# Patient Record
Sex: Male | Born: 1940
Health system: Southern US, Community
[De-identification: ages and names within clinical notes are randomized; demographics above are authoritative.]

## PROBLEM LIST (undated history)

## (undated) DIAGNOSIS — T8859XA Other complications of anesthesia, initial encounter: Secondary | ICD-10-CM

## (undated) DIAGNOSIS — N400 Enlarged prostate without lower urinary tract symptoms: Secondary | ICD-10-CM

## (undated) DIAGNOSIS — E785 Hyperlipidemia, unspecified: Secondary | ICD-10-CM

## (undated) DIAGNOSIS — M1990 Unspecified osteoarthritis, unspecified site: Secondary | ICD-10-CM

## (undated) DIAGNOSIS — H9319 Tinnitus, unspecified ear: Secondary | ICD-10-CM

## (undated) DIAGNOSIS — R310 Gross hematuria: Secondary | ICD-10-CM

## (undated) DIAGNOSIS — T4145XA Adverse effect of unspecified anesthetic, initial encounter: Secondary | ICD-10-CM

## (undated) DIAGNOSIS — Z973 Presence of spectacles and contact lenses: Secondary | ICD-10-CM

## (undated) DIAGNOSIS — N1330 Unspecified hydronephrosis: Secondary | ICD-10-CM

## (undated) DIAGNOSIS — R972 Elevated prostate specific antigen [PSA]: Secondary | ICD-10-CM

## (undated) DIAGNOSIS — I10 Essential (primary) hypertension: Secondary | ICD-10-CM

## (undated) DIAGNOSIS — K219 Gastro-esophageal reflux disease without esophagitis: Secondary | ICD-10-CM

## (undated) DIAGNOSIS — R112 Nausea with vomiting, unspecified: Secondary | ICD-10-CM

## (undated) DIAGNOSIS — Z9889 Other specified postprocedural states: Secondary | ICD-10-CM

## (undated) HISTORY — PX: OTHER SURGICAL HISTORY: SHX169

## (undated) HISTORY — DX: Essential (primary) hypertension: I10

## (undated) HISTORY — PX: TOTAL HIP ARTHROPLASTY: SHX124

## (undated) HISTORY — DX: Hyperlipidemia, unspecified: E78.5

## (undated) HISTORY — PX: APPENDECTOMY: SHX54

---

## 2003-12-04 ENCOUNTER — Emergency Department (HOSPITAL_COMMUNITY): Admission: EM | Admit: 2003-12-04 | Discharge: 2003-12-04 | Payer: Self-pay | Admitting: *Deleted

## 2005-07-09 ENCOUNTER — Inpatient Hospital Stay (HOSPITAL_COMMUNITY): Admission: RE | Admit: 2005-07-09 | Discharge: 2005-07-12 | Payer: Self-pay | Admitting: Orthopedic Surgery

## 2011-01-22 ENCOUNTER — Ambulatory Visit
Admission: RE | Admit: 2011-01-22 | Discharge: 2011-01-22 | Disposition: A | Discharge status: Home / Self Care | Payer: Self-pay | Source: Ambulatory Visit | Attending: *Deleted | Admitting: *Deleted

## 2011-01-22 ENCOUNTER — Other Ambulatory Visit: Payer: Self-pay | Admitting: *Deleted

## 2011-01-22 DIAGNOSIS — IMO0002 Reserved for concepts with insufficient information to code with codable children: Secondary | ICD-10-CM

## 2011-03-25 HISTORY — PX: CATARACT EXTRACTION W/ INTRAOCULAR LENS IMPLANT: SHX1309

## 2011-04-08 DIAGNOSIS — H40019 Open angle with borderline findings, low risk, unspecified eye: Secondary | ICD-10-CM | POA: Diagnosis not present

## 2011-05-09 DIAGNOSIS — H2510 Age-related nuclear cataract, unspecified eye: Secondary | ICD-10-CM | POA: Diagnosis not present

## 2011-05-29 DIAGNOSIS — H2510 Age-related nuclear cataract, unspecified eye: Secondary | ICD-10-CM | POA: Diagnosis not present

## 2011-06-16 DIAGNOSIS — R809 Proteinuria, unspecified: Secondary | ICD-10-CM | POA: Diagnosis not present

## 2011-06-16 DIAGNOSIS — R82998 Other abnormal findings in urine: Secondary | ICD-10-CM | POA: Diagnosis not present

## 2011-06-16 DIAGNOSIS — Z0000 Encounter for general adult medical examination without abnormal findings: Secondary | ICD-10-CM | POA: Diagnosis not present

## 2011-08-22 DIAGNOSIS — J309 Allergic rhinitis, unspecified: Secondary | ICD-10-CM | POA: Diagnosis not present

## 2011-08-22 DIAGNOSIS — I10 Essential (primary) hypertension: Secondary | ICD-10-CM | POA: Diagnosis not present

## 2011-10-03 DIAGNOSIS — K143 Hypertrophy of tongue papillae: Secondary | ICD-10-CM | POA: Diagnosis not present

## 2011-11-06 DIAGNOSIS — H40019 Open angle with borderline findings, low risk, unspecified eye: Secondary | ICD-10-CM | POA: Diagnosis not present

## 2012-01-19 ENCOUNTER — Encounter: Payer: Self-pay | Admitting: Physician Assistant

## 2012-01-19 ENCOUNTER — Ambulatory Visit (INDEPENDENT_AMBULATORY_CARE_PROVIDER_SITE_OTHER): Payer: Self-pay | Admitting: Physician Assistant

## 2012-01-19 VITALS — BP 131/68 | HR 88 | Temp 98.4°F | Ht 72.5 in | Wt 223.0 lb

## 2012-01-19 DIAGNOSIS — IMO0002 Reserved for concepts with insufficient information to code with codable children: Secondary | ICD-10-CM

## 2012-01-19 DIAGNOSIS — Z87898 Personal history of other specified conditions: Secondary | ICD-10-CM

## 2012-01-19 DIAGNOSIS — Z131 Encounter for screening for diabetes mellitus: Secondary | ICD-10-CM

## 2012-01-19 DIAGNOSIS — E785 Hyperlipidemia, unspecified: Secondary | ICD-10-CM | POA: Diagnosis not present

## 2012-01-19 DIAGNOSIS — I10 Essential (primary) hypertension: Secondary | ICD-10-CM | POA: Diagnosis not present

## 2012-01-19 DIAGNOSIS — Z13220 Encounter for screening for lipoid disorders: Secondary | ICD-10-CM

## 2012-01-19 DIAGNOSIS — M170 Bilateral primary osteoarthritis of knee: Secondary | ICD-10-CM

## 2012-01-19 DIAGNOSIS — M1710 Unilateral primary osteoarthritis, unspecified knee: Secondary | ICD-10-CM | POA: Diagnosis not present

## 2012-01-19 MED ORDER — MELOXICAM 7.5 MG PO TABS: 7.5000 mg | ORAL_TABLET | Freq: Every day | ORAL | Status: DC

## 2012-01-19 NOTE — Patient Instructions (Addendum)
Get labs will call with results.   Start Mobic. Follow up with CPE.

## 2012-01-20 DIAGNOSIS — E785 Hyperlipidemia, unspecified: Secondary | ICD-10-CM | POA: Diagnosis not present

## 2012-01-20 DIAGNOSIS — Z87898 Personal history of other specified conditions: Secondary | ICD-10-CM | POA: Insufficient documentation

## 2012-01-20 DIAGNOSIS — M170 Bilateral primary osteoarthritis of knee: Secondary | ICD-10-CM | POA: Insufficient documentation

## 2012-01-20 LAB — COMPREHENSIVE METABOLIC PANEL
ALT: 17 U/L (ref 0–53)
AST: 15 U/L (ref 0–37)
Albumin: 4.1 g/dL (ref 3.5–5.2)
Alkaline Phosphatase: 55 U/L (ref 39–117)
BUN: 21 mg/dL (ref 6–23)
CO2: 24 mEq/L (ref 19–32)
Calcium: 9.3 mg/dL (ref 8.4–10.5)
Chloride: 108 mEq/L (ref 96–112)
Creat: 1.67 mg/dL — ABNORMAL HIGH (ref 0.50–1.35)
Glucose, Bld: 109 mg/dL — ABNORMAL HIGH (ref 70–99)
Potassium: 4.8 mEq/L (ref 3.5–5.3)
Sodium: 143 mEq/L (ref 135–145)
Total Bilirubin: 0.5 mg/dL (ref 0.3–1.2)
Total Protein: 7.4 g/dL (ref 6.0–8.3)

## 2012-01-20 LAB — LIPID PANEL
Cholesterol: 249 mg/dL — ABNORMAL HIGH (ref 0–200)
HDL: 54 mg/dL (ref >39)
LDL Cholesterol: 178 mg/dL — ABNORMAL HIGH (ref 0–99)
Total CHOL/HDL Ratio: 4.6 Ratio
Triglycerides: 84 mg/dL (ref <150)
VLDL: 17 mg/dL (ref 0–40)

## 2012-01-20 NOTE — Progress Notes (Signed)
Subjective:    Patient ID: Albert Hicks, male    DOB: [birth year], 71 y.o.   MRN: [id number]  HPI Patient presents to the clinic to establish as a new patient. PMH was reviewed and positive for HTN, elevated PSA and hyperlipidemia.   He is currently on Azor for blood pressure and well controlled. He denies any CP, palpitations, SOB. He tries to maintain a healthy balanced diet. He has not had bloodwork in a while.  He does have history of elevated PSA. He has had this for years and had numerous biopies and all were benign. He is currently being followed by Santina Evans at [facility].   Hyperlipidemia is ongoing. Last rx he tried was Lipitor and it caused muscle aches. He has not tried anything since or had blood work.   He does not know when last Tdap was and wants [place] to get records before we vaccinate him for it. He has had colonoscopy in 2009 and was told to follow up in 10 years. He has had a right hip replacement.   Family history reviewed and positive for prostate cancer, diabetes, and high blood pressure. Social hx reviewed. Never smoked.   He does have ongoing bilateral knee pain that is worse on the left knee. He takes ibuprofen but other than that has not tried anything. He has had x-rays done 10 years ago that saw arthritis already setting in. He tries to stay active and he does a good job. He plays golf and stays on the go a lot. He exercises 3 days at least 1 hour a week. He has started to have a lot of problems flexing his knee. Denies any recent trauma.   Review of Systems     Objective:   Physical Exam  Constitutional: He is oriented to person, place, and time. He appears well-developed and well-nourished.  HENT:  Head: Normocephalic and atraumatic.  Cardiovascular: Normal rate, regular rhythm and normal heart sounds.   Pulmonary/Chest: Effort normal and breath sounds normal. He has no wheezes.  Musculoskeletal:       Decreased ROM of both knees. Extension is normal but  flexion is at 95 approx degrees in left knee and 110 approx in right knee. Denies any joint tenderness to palpation bilaterally. Mild crepitus. Pt's gait is normal. He did have a hard time getting up out of chair.   Neurological: He is alert and oriented to person, place, and time.  Skin: Skin is warm and dry.  Psychiatric: He has a normal mood and affect. His behavior is normal.          Assessment & Plan:  HTN- continue with Azor. Reminded patient of low salt diet and regular exercise. Follow up in 6 months.   Hyperlipidemia- Gave lab slip to get fasting labs drawn. Discuss with patient livalo as a option that might have less side effects. He is in agreement to try. Will get baseline labs and then give options for treatment. Reassured patient that if statins were not able to be tolerated then we could try other options. Also encouraged to make good diet decisions and try to stay active. Over 50 encouraged daily ASA.   Elevated PSA- Encouraged patient to continue regular follow ups with urologist.   Osteoarthritis of knees- Will try mobic to use 7.5mg  daily for first 2 weeks then only as needed or on days of a lot of activity. Discussed with patient options of cortisone injections or supartz. He is aware but does not  want to try anything else right now. He declines x-rays. I would like to do x-rays in the near future. Will follow up in next 3 months and see how mobic is working and if he wants to proceed with further treatment.    Discussed with patient need for CPE in near future.

## 2012-01-22 DIAGNOSIS — E785 Hyperlipidemia, unspecified: Secondary | ICD-10-CM | POA: Diagnosis not present

## 2012-01-22 DIAGNOSIS — Z131 Encounter for screening for diabetes mellitus: Secondary | ICD-10-CM | POA: Diagnosis not present

## 2012-01-22 DIAGNOSIS — R7309 Other abnormal glucose: Secondary | ICD-10-CM | POA: Diagnosis not present

## 2012-01-22 DIAGNOSIS — I10 Essential (primary) hypertension: Secondary | ICD-10-CM | POA: Diagnosis not present

## 2012-01-22 NOTE — Addendum Note (Signed)
Addended by: Ellsworth Lennox on: 01/22/2012 09:25 AM   Modules accepted: Orders

## 2012-01-26 DIAGNOSIS — I10 Essential (primary) hypertension: Secondary | ICD-10-CM | POA: Diagnosis not present

## 2012-01-27 LAB — HEMOGLOBIN A1C
Hgb A1c MFr Bld: 5.6 % (ref <5.7)
Mean Plasma Glucose: 114 mg/dL (ref <117)

## 2012-01-27 LAB — CK: Total CK: 89 U/L (ref 7–232)

## 2012-02-07 DIAGNOSIS — E7800 Pure hypercholesterolemia, unspecified: Secondary | ICD-10-CM | POA: Diagnosis not present

## 2012-02-07 DIAGNOSIS — Z96649 Presence of unspecified artificial hip joint: Secondary | ICD-10-CM | POA: Diagnosis not present

## 2012-02-07 DIAGNOSIS — R319 Hematuria, unspecified: Secondary | ICD-10-CM | POA: Diagnosis not present

## 2012-02-07 DIAGNOSIS — N1330 Unspecified hydronephrosis: Secondary | ICD-10-CM | POA: Diagnosis not present

## 2012-02-07 DIAGNOSIS — Z79899 Other long term (current) drug therapy: Secondary | ICD-10-CM | POA: Diagnosis not present

## 2012-02-07 DIAGNOSIS — N400 Enlarged prostate without lower urinary tract symptoms: Secondary | ICD-10-CM | POA: Diagnosis not present

## 2012-02-07 DIAGNOSIS — R7309 Other abnormal glucose: Secondary | ICD-10-CM | POA: Diagnosis not present

## 2012-02-07 DIAGNOSIS — R935 Abnormal findings on diagnostic imaging of other abdominal regions, including retroperitoneum: Secondary | ICD-10-CM | POA: Diagnosis not present

## 2012-02-07 DIAGNOSIS — N289 Disorder of kidney and ureter, unspecified: Secondary | ICD-10-CM | POA: Diagnosis not present

## 2012-02-07 DIAGNOSIS — I10 Essential (primary) hypertension: Secondary | ICD-10-CM | POA: Diagnosis not present

## 2012-02-10 ENCOUNTER — Other Ambulatory Visit: Payer: Self-pay | Admitting: Urology

## 2012-02-10 DIAGNOSIS — N401 Enlarged prostate with lower urinary tract symptoms: Secondary | ICD-10-CM | POA: Diagnosis not present

## 2012-02-10 DIAGNOSIS — N1330 Unspecified hydronephrosis: Secondary | ICD-10-CM | POA: Diagnosis not present

## 2012-02-10 DIAGNOSIS — R972 Elevated prostate specific antigen [PSA]: Secondary | ICD-10-CM | POA: Diagnosis not present

## 2012-02-11 ENCOUNTER — Encounter (HOSPITAL_BASED_OUTPATIENT_CLINIC_OR_DEPARTMENT_OTHER): Payer: Self-pay | Admitting: *Deleted

## 2012-02-11 NOTE — Progress Notes (Signed)
NPO AFTER MN. ARRIVES AT 0830. NEEDS ISTAT AND EKG. WILL TAKE NORVASC AM OF SURG W/ SIP OF WATER.

## 2012-02-12 NOTE — H&P (Signed)
History of Present Illness  Albert Hicks was seen in the [place] ER 4 days ago with a history of sudden onset of gross painless hematuria.  It started Saturday afternoon and cleared up Sunday.  Yesterday he had 2 more episodes of hematuria.  CT scan without contrast showed moderate left hydronephrosis, thickened bladder wall and enlarged prostate.  He has a history of elevated PSA with at least five negative biopsies in the past. He reports that he voids with a good flow.  He denies dysuria, nocturia.   Past Medical History Problems  1. History of  Esophageal Reflux 530.81 2. History of  Gout 274.9 3. History of  Hypercholesterolemia 272.0  Surgical History Problems  1. History of  Knee Surgery Left 2. History of  Total Hip Replacement  Current Meds 1. AmLODIPine Besylate 5 MG Oral Tablet; Therapy: 17Nov2013 to 2. Ciprofloxacin HCl 500 MG Oral Tablet; Therapy: 17Nov2013 to 3. Hydrochlorothiazide 25 MG Oral Tablet; Therapy: 17Nov2013 to 4. Livalo 2 MG Oral Tablet; Therapy: (Recorded:19Nov2013) to  Allergies Medication  1. No Known Drug Allergies  Family History Problems  1. Family history of  Family Health Status Number Of Children 1 son 4 daughters 2. Paternal history of  Prostate Cancer V16.42  Social History Problems  1. Alcohol Use 2 per day 2. Caffeine Use 1 per day 3. Marital History - Single Denied  4. History of  Tobacco Use  Review of Systems Genitourinary, constitutional, skin, eye, otolaryngeal, hematologic/lymphatic, cardiovascular, pulmonary, endocrine, musculoskeletal, gastrointestinal, neurological and psychiatric system(s) were reviewed and pertinent findings if present are noted.  Genitourinary: weak urinary stream, urinary stream starts and stops, hematuria and erectile dysfunction.  Musculoskeletal: joint pain.    Physical Exam Constitutional: Well nourished and well developed . No acute distress.  ENT:. The ears and nose are normal in appearance.    Neck: The appearance of the neck is normal and no neck mass is present.  Pulmonary: No respiratory distress and normal respiratory rhythm and effort.  Cardiovascular: Heart rate and rhythm are normal . No peripheral edema.  Abdomen: The abdomen is soft and nontender. No masses are palpated. No CVA tenderness. No hernias are palpable. No hepatosplenomegaly noted.  Rectal: Rectal exam demonstrates normal sphincter tone, no tenderness and no masses. Estimated prostate size is 3+. The prostate has no nodularity, is not indurated and is not tender. The left seminal vesicle is nonpalpable. The right seminal vesicle is nonpalpable. The perineum is normal on inspection.  Genitourinary: Examination of the penis demonstrates no discharge, no masses, no lesions and a normal meatus. The scrotum is without lesions. The right epididymis is palpably normal and non-tender. The left epididymis is palpably normal and non-tender. The right testis is non-tender and without masses. The left testis is non-tender and without masses.  Lymphatics: The femoral and inguinal nodes are not enlarged or tender.  Skin: Normal skin turgor, no visible rash and no visible skin lesions.  Neuro/Psych:. Mood and affect are appropriate.    Results/Data Urine [Data Includes: Last 1 Day]   19Nov2013  COLOR AMBER   APPEARANCE CLOUDY   SPECIFIC GRAVITY 1.025   pH 5.5   GLUCOSE NEG mg/dL  BILIRUBIN NEG   KETONE NEG mg/dL  BLOOD LARGE   PROTEIN TRACE mg/dL  UROBILINOGEN 0.2 mg/dL  NITRITE NEG   LEUKOCYTE ESTERASE NEG   SQUAMOUS EPITHELIAL/HPF RARE   WBC 0-2 WBC/hpf  RBC TNTC RBC/hpf  BACTERIA RARE   CRYSTALS NONE SEEN   CASTS NONE SEEN  I independently reviewed the CT scan and the findings are as noted in the HPI.   Assessment Assessed  1. PSA,Elevated 790.93 2. Hydronephrosis 591 3. Benign Prostatic Hypertrophy With Urinary Obstruction 600.01  Plan Health Maintenance (V70.0)  1. UA With REFLEX  Done: 19Nov2013  10:52AM PSA,Elevated (790.93)  2. PSA  Done: 19Nov2013 3. VENIPUNCTURE  Done: 19Nov2013 4. Follow-up Schedule Surgery Office  Follow-up  Done: 19Nov2013   Cystoscopy, left retrograde pyelogram.  The procedure, risks, benefits were explained to the patient.  The risks include but are not limited to hemorrhage, infection, bladder or ureteral injury.  He understands and wishes to proceed.   Signatures Electronically signed by : Su Grand, M.D.; Feb 10 2012  1:58PM

## 2012-02-13 ENCOUNTER — Encounter (HOSPITAL_BASED_OUTPATIENT_CLINIC_OR_DEPARTMENT_OTHER): Payer: Self-pay | Admitting: Anesthesiology

## 2012-02-13 ENCOUNTER — Other Ambulatory Visit: Payer: Self-pay

## 2012-02-13 ENCOUNTER — Ambulatory Visit (HOSPITAL_BASED_OUTPATIENT_CLINIC_OR_DEPARTMENT_OTHER): Payer: Medicare Other | Admitting: Anesthesiology

## 2012-02-13 ENCOUNTER — Encounter (HOSPITAL_BASED_OUTPATIENT_CLINIC_OR_DEPARTMENT_OTHER): Payer: Self-pay | Admitting: *Deleted

## 2012-02-13 ENCOUNTER — Encounter (HOSPITAL_BASED_OUTPATIENT_CLINIC_OR_DEPARTMENT_OTHER)
Admission: RE | Disposition: A | Discharge status: Home / Self Care | Payer: Self-pay | Source: Ambulatory Visit | Attending: Urology

## 2012-02-13 ENCOUNTER — Ambulatory Visit (HOSPITAL_BASED_OUTPATIENT_CLINIC_OR_DEPARTMENT_OTHER)
Admission: RE | Admit: 2012-02-13 | Discharge: 2012-02-13 | Disposition: A | Discharge status: Home / Self Care | Payer: Medicare Other | Source: Ambulatory Visit | Attending: Urology | Admitting: Urology

## 2012-02-13 DIAGNOSIS — N1330 Unspecified hydronephrosis: Secondary | ICD-10-CM | POA: Insufficient documentation

## 2012-02-13 DIAGNOSIS — R310 Gross hematuria: Secondary | ICD-10-CM | POA: Diagnosis not present

## 2012-02-13 DIAGNOSIS — E7800 Pure hypercholesterolemia, unspecified: Secondary | ICD-10-CM | POA: Diagnosis not present

## 2012-02-13 DIAGNOSIS — Z8042 Family history of malignant neoplasm of prostate: Secondary | ICD-10-CM | POA: Diagnosis not present

## 2012-02-13 DIAGNOSIS — N400 Enlarged prostate without lower urinary tract symptoms: Secondary | ICD-10-CM | POA: Diagnosis not present

## 2012-02-13 DIAGNOSIS — K219 Gastro-esophageal reflux disease without esophagitis: Secondary | ICD-10-CM | POA: Insufficient documentation

## 2012-02-13 DIAGNOSIS — R972 Elevated prostate specific antigen [PSA]: Secondary | ICD-10-CM | POA: Insufficient documentation

## 2012-02-13 DIAGNOSIS — Z79899 Other long term (current) drug therapy: Secondary | ICD-10-CM | POA: Insufficient documentation

## 2012-02-13 HISTORY — DX: Unspecified hydronephrosis: N13.30

## 2012-02-13 HISTORY — DX: Benign prostatic hyperplasia without lower urinary tract symptoms: N40.0

## 2012-02-13 HISTORY — DX: Gastro-esophageal reflux disease without esophagitis: K21.9

## 2012-02-13 HISTORY — DX: Elevated prostate specific antigen (PSA): R97.20

## 2012-02-13 HISTORY — DX: Unspecified osteoarthritis, unspecified site: M19.90

## 2012-02-13 HISTORY — PX: CYSTOSCOPY: SHX5120

## 2012-02-13 HISTORY — DX: Gross hematuria: R31.0

## 2012-02-13 LAB — POCT I-STAT 4, (NA,K, GLUC, HGB,HCT)
Glucose, Bld: 123 mg/dL — ABNORMAL HIGH (ref 70–99)
HCT: 44 % (ref 39.0–52.0)
Hemoglobin: 15 g/dL (ref 13.0–17.0)
Potassium: 5 mEq/L (ref 3.5–5.1)
Sodium: 143 mEq/L (ref 135–145)

## 2012-02-13 SURGERY — CYSTOSCOPY, FLEXIBLE
Anesthesia: General | Site: Bladder | Wound class: Clean Contaminated

## 2012-02-13 MED ORDER — DEXAMETHASONE SODIUM PHOSPHATE 4 MG/ML IJ SOLN
INTRAMUSCULAR | Status: DC | PRN
  Administered 2012-02-13: 8 mg via INTRAVENOUS

## 2012-02-13 MED ORDER — HYDROMORPHONE HCL PF 1 MG/ML IJ SOLN
0.2500 mg | INTRAMUSCULAR | Status: DC | PRN
  Filled 2012-02-13: qty 1

## 2012-02-13 MED ORDER — STERILE WATER FOR IRRIGATION IR SOLN
Status: DC | PRN
  Administered 2012-02-13: 6000 mL via INTRAVESICAL

## 2012-02-13 MED ORDER — ONDANSETRON HCL 4 MG/2ML IJ SOLN
INTRAMUSCULAR | Status: DC | PRN
  Administered 2012-02-13: 4 mg via INTRAVENOUS

## 2012-02-13 MED ORDER — PROMETHAZINE HCL 25 MG/ML IJ SOLN
6.2500 mg | INTRAMUSCULAR | Status: DC | PRN
  Filled 2012-02-13: qty 1

## 2012-02-13 MED ORDER — LACTATED RINGERS IV SOLN
INTRAVENOUS | Status: DC
  Filled 2012-02-13: qty 1000

## 2012-02-13 MED ORDER — INDIGOTINDISULFONATE SODIUM 8 MG/ML IJ SOLN
INTRAMUSCULAR | Status: DC | PRN
  Administered 2012-02-13: 5 mL via INTRAVENOUS

## 2012-02-13 MED ORDER — LIDOCAINE HCL (CARDIAC) 20 MG/ML IV SOLN
INTRAVENOUS | Status: DC | PRN
  Administered 2012-02-13: 60 mg via INTRAVENOUS

## 2012-02-13 MED ORDER — MIDAZOLAM HCL 5 MG/5ML IJ SOLN
INTRAMUSCULAR | Status: DC | PRN
  Administered 2012-02-13: 2 mg via INTRAVENOUS

## 2012-02-13 MED ORDER — PROPOFOL 10 MG/ML IV BOLUS
INTRAVENOUS | Status: DC | PRN
  Administered 2012-02-13: 200 mg via INTRAVENOUS

## 2012-02-13 MED ORDER — EPHEDRINE SULFATE 50 MG/ML IJ SOLN
INTRAMUSCULAR | Status: DC | PRN
  Administered 2012-02-13: 10 mg via INTRAVENOUS

## 2012-02-13 MED ORDER — FENTANYL CITRATE 0.05 MG/ML IJ SOLN
INTRAMUSCULAR | Status: DC | PRN
  Administered 2012-02-13: 25 ug via INTRAVENOUS
  Administered 2012-02-13 (×2): 50 ug via INTRAVENOUS
  Administered 2012-02-13: 25 ug via INTRAVENOUS
  Administered 2012-02-13: 50 ug via INTRAVENOUS

## 2012-02-13 MED ORDER — LACTATED RINGERS IV SOLN
INTRAVENOUS | Status: DC
  Administered 2012-02-13: 100 mL/h via INTRAVENOUS
  Administered 2012-02-13 (×2): via INTRAVENOUS
  Filled 2012-02-13: qty 1000

## 2012-02-13 MED ORDER — CEFAZOLIN SODIUM-DEXTROSE 2-3 GM-% IV SOLR
2.0000 g | INTRAVENOUS | Status: AC
  Administered 2012-02-13: 2 g via INTRAVENOUS
  Filled 2012-02-13: qty 50

## 2012-02-13 MED ORDER — CEFAZOLIN SODIUM 1-5 GM-% IV SOLN
1.0000 g | INTRAVENOUS | Status: DC
  Filled 2012-02-13: qty 50

## 2012-02-13 SURGICAL SUPPLY — 37 items
ADAPTER CATH URET PLST 4-6FR (CATHETERS) ×2 IMPLANT
ADPR CATH URET STRL DISP 4-6FR (CATHETERS) ×2
BAG DRAIN URO-CYSTO SKYTR STRL (DRAIN) ×3 IMPLANT
BAG DRN UROCATH (DRAIN) ×2
BASKET LASER NITINOL 1.9FR (BASKET) IMPLANT
BASKET SEGURA 3FR (UROLOGICAL SUPPLIES) IMPLANT
BASKET STNLS GEMINI 4WIRE 3FR (BASKET) IMPLANT
BASKET ZERO TIP NITINOL 2.4FR (BASKET) IMPLANT
BRUSH URET BIOPSY 3F (UROLOGICAL SUPPLIES) IMPLANT
BSKT STON RTRVL 120 1.9FR (BASKET)
BSKT STON RTRVL GEM 120X11 3FR (BASKET)
BSKT STON RTRVL ZERO TP 2.4FR (BASKET)
CANISTER SUCT LVC 12 LTR MEDI- (MISCELLANEOUS) ×2 IMPLANT
CATH INTERMIT  6FR 70CM (CATHETERS) IMPLANT
CATH URET 5FR 28IN CONE TIP (BALLOONS)
CATH URET 5FR 28IN OPEN ENDED (CATHETERS) IMPLANT
CATH URET 5FR 70CM CONE TIP (BALLOONS) IMPLANT
CLOTH BEACON ORANGE TIMEOUT ST (SAFETY) ×3 IMPLANT
DRAPE CAMERA CLOSED 9X96 (DRAPES) ×3 IMPLANT
ELECT REM PT RETURN 9FT ADLT (ELECTROSURGICAL) ×3
ELECTRODE REM PT RTRN 9FT ADLT (ELECTROSURGICAL) ×1 IMPLANT
GLOVE BIO SURGEON STRL SZ 6.5 (GLOVE) ×4 IMPLANT
GLOVE BIO SURGEON STRL SZ7 (GLOVE) ×3 IMPLANT
GOWN XL W/COTTON TOWEL STD (GOWNS) ×3 IMPLANT
GUIDEWIRE 0.038 PTFE COATED (WIRE) IMPLANT
GUIDEWIRE ANG ZIPWIRE 038X150 (WIRE) IMPLANT
GUIDEWIRE STR DUAL SENSOR (WIRE) ×4 IMPLANT
KIT BALLIN UROMAX 15FX10 (LABEL) IMPLANT
KIT BALLN UROMAX 15FX4 (MISCELLANEOUS) IMPLANT
KIT BALLN UROMAX 26 75X4 (MISCELLANEOUS)
LASER FIBER DISP (UROLOGICAL SUPPLIES) IMPLANT
LASER FIBER DISP 1000U (UROLOGICAL SUPPLIES) IMPLANT
PACK CYSTOSCOPY (CUSTOM PROCEDURE TRAY) ×3 IMPLANT
SET HIGH PRES BAL DIL (LABEL)
SHEATH URET ACCESS 12FR/35CM (UROLOGICAL SUPPLIES) IMPLANT
SHEATH URET ACCESS 12FR/55CM (UROLOGICAL SUPPLIES) IMPLANT
WATER STERILE IRR 3000ML UROMA (IV SOLUTION) ×4 IMPLANT

## 2012-02-13 NOTE — Anesthesia Postprocedure Evaluation (Signed)
Anesthesia Post Note  Patient: Albert Hicks  Procedure(s) Performed: Procedure(s) (LRB): CYSTOSCOPY FLEXIBLE (N/A)  Anesthesia type: General  Patient location: PACU  Post pain: Pain level controlled  Post assessment: Post-op Vital signs reviewed  Last Vitals:  Filed Vitals:   02/13/12 1130  BP: 134/78  Pulse: 75  Temp:   Resp: 14    Post vital signs: Reviewed  Level of consciousness: sedated  Complications: No apparent anesthesia complications

## 2012-02-13 NOTE — Anesthesia Procedure Notes (Signed)
Procedure Name: LMA Insertion Date/Time: 02/13/2012 9:55 AM Performed by: Renella Cunas D Pre-anesthesia Checklist: Patient identified, Emergency Drugs available, Suction available and Patient being monitored Patient Re-evaluated:Patient Re-evaluated prior to inductionOxygen Delivery Method: Circle System Utilized Preoxygenation: Pre-oxygenation with 100% oxygen Intubation Type: IV induction Ventilation: Mask ventilation without difficulty LMA: LMA inserted LMA Size: 4.0 Number of attempts: 1 Airway Equipment and Method: bite block Placement Confirmation: positive ETCO2 Tube secured with: Tape Dental Injury: Teeth and Oropharynx as per pre-operative assessment

## 2012-02-13 NOTE — Anesthesia Preprocedure Evaluation (Addendum)
Anesthesia Evaluation  Patient identified by MRN, date of birth, ID band Patient awake    Reviewed: Allergy & Precautions, H&P , NPO status , Patient's Chart, lab work & pertinent test results  Airway Mallampati: II TM Distance: >3 FB Neck ROM: Full    Dental  (+) Teeth Intact, Caps and Dental Advisory Given   Pulmonary neg pulmonary ROS,  breath sounds clear to auscultation  Pulmonary exam normal       Cardiovascular hypertension, Pt. on medications Rhythm:Regular Rate:Normal     Neuro/Psych negative neurological ROS  negative psych ROS   GI/Hepatic Neg liver ROS, GERD-  Medicated,  Endo/Other  negative endocrine ROS  Renal/GU Renal disease  negative genitourinary   Musculoskeletal  (+) Arthritis -, Osteoarthritis,    Abdominal   Peds  Hematology negative hematology ROS (+)   Anesthesia Other Findings   Reproductive/Obstetrics negative OB ROS                          Anesthesia Physical Anesthesia Plan  ASA: II  Anesthesia Plan: General   Post-op Pain Management:    Induction: Intravenous  Airway Management Planned: LMA  Additional Equipment:   Intra-op Plan:   Post-operative Plan: Extubation in OR  Informed Consent: I have reviewed the patients History and Physical, chart, labs and discussed the procedure including the risks, benefits and alternatives for the proposed anesthesia with the patient or authorized representative who has indicated his/her understanding and acceptance.   Dental advisory given  Plan Discussed with: CRNA  Anesthesia Plan Comments:         Anesthesia Quick Evaluation

## 2012-02-13 NOTE — Op Note (Signed)
Albert Hicks is a 71 y.o.   02/13/2012  Pre-op diagnosis: Gross hematuria, elevated PSA, BPH, left hydronephrosis  Postop diagnosis: Same  Procedure done: Cystoscopy, attempted left retrograde pyelogram  Surgeon: Wendie Simmer. Albert Hicks  Anesthesia: General  Indication: Patient is a 71 years old male who had gross painless hematuria about 6 days ago. He was seen in the emergency room and a CT scan showed  moderate left hydronephrosis and markedly enlarged prostate. He has a history of elevated PSA with 5 negative prostate biopsies in the past. He is scheduled today for cystoscopy and left retrograde pyelogram  Procedure: Patient was identified by his wrist band and proper timeout was taken.  Under general anesthesia he was prepped and draped and placed in the dorsolithotomy position. A panendoscope was inserted in the bladder. The anterior urethra is normal. There is marked enlargement of the prostate gland with a large median lobe. The bladder is  heavily trabeculated with cellules and diverticula. There is no stone or tumor in the bladder. It was difficult to identify the ureteral orifices because of the size of the median lobe. 1 ampule of indigo carmine was then injected intravenously. After about 10 minutes blue dye was noted in the bladder; however the ureteral orifices could not be identified. Cystoscopy was done with the with the 12 and 70 cystoscope and also with a flexible cystoscope. I was still unable to identify the ureteral orifices. At this point the bladder was emptied and the cystoscope removed.  The patient hydronephrosis is probably secondary to enlargement of the prostate gland.  He tolerated the procedure well and left the OR in satisfactory condition to postanesthesia care unit.

## 2012-02-13 NOTE — Transfer of Care (Signed)
Immediate Anesthesia Transfer of Care Note  Patient: Albert Hicks  Procedure(s) Performed: Procedure(s) (LRB): CYSTOSCOPY FLEXIBLE (N/A)  Patient Location: PACU  Anesthesia Type: General  Level of Consciousness: awake, oriented, sedated and patient cooperative  Airway & Oxygen Therapy: Patient Spontanous Breathing and Patient connected to face mask oxygen  Post-op Assessment: Report given to PACU RN and Post -op Vital signs reviewed and stable  Post vital signs: Reviewed and stable  Complications: No apparent anesthesia complications

## 2012-02-16 ENCOUNTER — Encounter (HOSPITAL_BASED_OUTPATIENT_CLINIC_OR_DEPARTMENT_OTHER): Payer: Self-pay | Admitting: Urology

## 2012-02-23 ENCOUNTER — Encounter: Payer: Self-pay | Admitting: Physician Assistant

## 2012-02-23 ENCOUNTER — Ambulatory Visit (INDEPENDENT_AMBULATORY_CARE_PROVIDER_SITE_OTHER): Payer: BC Managed Care – PPO | Admitting: Physician Assistant

## 2012-02-23 VITALS — BP 112/60 | HR 103 | Ht 73.0 in | Wt 216.0 lb

## 2012-02-23 DIAGNOSIS — N289 Disorder of kidney and ureter, unspecified: Secondary | ICD-10-CM

## 2012-02-23 DIAGNOSIS — N390 Urinary tract infection, site not specified: Secondary | ICD-10-CM | POA: Diagnosis not present

## 2012-02-23 DIAGNOSIS — R7989 Other specified abnormal findings of blood chemistry: Secondary | ICD-10-CM

## 2012-02-23 DIAGNOSIS — N2889 Other specified disorders of kidney and ureter: Secondary | ICD-10-CM | POA: Diagnosis not present

## 2012-02-23 DIAGNOSIS — N400 Enlarged prostate without lower urinary tract symptoms: Secondary | ICD-10-CM | POA: Insufficient documentation

## 2012-02-23 DIAGNOSIS — I10 Essential (primary) hypertension: Secondary | ICD-10-CM

## 2012-02-23 DIAGNOSIS — E785 Hyperlipidemia, unspecified: Secondary | ICD-10-CM | POA: Diagnosis not present

## 2012-02-23 DIAGNOSIS — N401 Enlarged prostate with lower urinary tract symptoms: Secondary | ICD-10-CM | POA: Diagnosis not present

## 2012-02-23 DIAGNOSIS — N1330 Unspecified hydronephrosis: Secondary | ICD-10-CM | POA: Diagnosis not present

## 2012-02-23 MED ORDER — PITAVASTATIN CALCIUM 2 MG PO TABS: 2.0000 mg | ORAL_TABLET | Freq: Every day | ORAL | Status: DC

## 2012-02-23 MED ORDER — AMLODIPINE BESYLATE 5 MG PO TABS: 5.0000 mg | ORAL_TABLET | Freq: Every morning | ORAL | Status: DC

## 2012-02-23 MED ORDER — PITAVASTATIN CALCIUM 2 MG PO TABS: 2.0000 mg | ORAL_TABLET | Freq: Once | ORAL | Status: DC

## 2012-02-23 MED ORDER — HYDROCHLOROTHIAZIDE 25 MG PO TABS: 25.0000 mg | ORAL_TABLET | Freq: Every morning | ORAL | Status: DC

## 2012-02-23 NOTE — Patient Instructions (Signed)
Refilled medications

## 2012-02-24 NOTE — Progress Notes (Signed)
  Subjective:    Patient ID: Albert Hicks, male    DOB: November 03, 1940, 71 y.o.   MRN: [id number]  HPI Patient presents to the clinic to follow up after ER and Urology appt. Patient was seen in the [facility] ER and has already been seen by urologist. He originally went to ER for blood in his urine. Urologist suspects that his enlarged prostate is causing Kidney Obstruction. Pt has had many biopsy and none were cancer. More testings needs to be done but Urologist suspects that surgery is the next step and would eliminate obstruction symptoms. Pt denies any urinary symptoms or weak stream, urinary frequency or urgency. His CR was elevated therefore ER took patient off ARB. Pt is now on HCTZ and Norvasc for BP. Still taking Livalo for cholesterol. DEnies any CP, palpitations, SoB. Pt  Needs refill on medications.    Review of Systems     Objective:   Physical Exam  Constitutional: He is oriented to person, place, and time. He appears well-developed and well-nourished.  HENT:  Head: Normocephalic and atraumatic.  Cardiovascular: Normal rate, regular rhythm and normal heart sounds.   Pulmonary/Chest: Effort normal and breath sounds normal.       No CVA tenderness.  Neurological: He is alert and oriented to person, place, and time.  Skin: Skin is warm and dry.  Psychiatric: He has a normal mood and affect. His behavior is normal.          Assessment & Plan:  Hypertension- Refilled HCTZ and Norvasc. BP appears to be well controlled today. He has still had a few high readings at home and at urologist this am. Encouraged pt to check BP and make sure staying under 140/90. Reminder pt of low salt diet. Follow up as needed or in 3 months for BP recheck.  Enlarged prostate/kidney obstruction/elevated Cr/renal impairment- Pt is being managed by urology. He will most likey be having his prostate removed in the near future to stop obstructions and symptoms.Fixing the underlying problem could also help  with his renal impairment/elevated Cr. We have stopped medications that could be making kidney function worse. Educated patient to stop taking mobic/ibuprofen. ONly take tylenol.  Hyperlipidemia- Continue on livalo. Since pt has reported to adverse effects. Will recheck cholesterol in 3 months. Continue with low fat diet and regular exercise.

## 2012-03-23 ENCOUNTER — Ambulatory Visit (INDEPENDENT_AMBULATORY_CARE_PROVIDER_SITE_OTHER): Payer: BC Managed Care – PPO | Admitting: Family Medicine

## 2012-03-23 ENCOUNTER — Encounter: Payer: Self-pay | Admitting: Family Medicine

## 2012-03-23 VITALS — BP 154/80 | HR 94 | Temp 98.4°F | Resp 16 | Ht 73.0 in | Wt 217.0 lb

## 2012-03-23 DIAGNOSIS — N189 Chronic kidney disease, unspecified: Secondary | ICD-10-CM

## 2012-03-23 DIAGNOSIS — I10 Essential (primary) hypertension: Secondary | ICD-10-CM | POA: Diagnosis not present

## 2012-03-23 DIAGNOSIS — B029 Zoster without complications: Secondary | ICD-10-CM

## 2012-03-23 DIAGNOSIS — N183 Chronic kidney disease, stage 3 unspecified: Secondary | ICD-10-CM

## 2012-03-23 MED ORDER — LIDOCAINE 4 % EX GEL: 1.0000 "application " | Freq: Three times a day (TID) | CUTANEOUS | Status: DC | PRN

## 2012-03-23 MED ORDER — VALACYCLOVIR HCL 1 G PO TABS: 1000.0000 mg | ORAL_TABLET | Freq: Three times a day (TID) | ORAL | Status: DC

## 2012-03-23 MED ORDER — LIDOCAINE HCL 2 % EX GEL: Freq: Three times a day (TID) | CUTANEOUS | Status: DC

## 2012-03-23 NOTE — Progress Notes (Signed)
  Subjective:    Patient ID: Albert Hicks, male    DOB: Jan 30, 1941, 71 y.o.   MRN: [id number]  HPI Had had pain and tendernss for aobut 3 days on his left side and then noticed a rash this AM. Had similar sxs a couple of years ago.  Pain is moderate.  Took Tylenol this AM - helped some.  Took BP meds this AM   Review of Systems     Objective:   Physical Exam  Constitutional: He appears well-developed and well-nourished.  HENT:  Head: Normocephalic and atraumatic.  Skin: Skin is warm and dry. Rash noted.       Erythematous blistering rash over the dermatome on his left flank from the spine to the mid abdomen.   Psychiatric: He has a normal mood and affect. His behavior is normal.          Assessment & Plan:  Shingles - discussed his diagnosis. Handout given. The rash started this morning so he's a great candidate for valacyclovir. Prescription sent to pharmacy. Also given a prescription for topical lidocaine gel and can continue his Tylenol for pain. I did offer him something stronger but he declined at this time. He can call if the rash is not improving over the next week.  HTN- blood pressure is elevated today, Ms. likely secondary to pain. Normally his blood pressures very well-controlled. He does have a followup with his urologist in the next couple weeks I encouraged him to have them check his blood pressure. If it's still high then he needs to come back in for office visit.

## 2012-03-23 NOTE — Patient Instructions (Signed)

## 2012-03-23 NOTE — Addendum Note (Signed)
Addended by: Chalmers Cater on: 03/23/2012 04:38 PM   Modules accepted: Orders

## 2012-04-15 ENCOUNTER — Other Ambulatory Visit: Payer: Self-pay | Admitting: Urology

## 2012-04-15 DIAGNOSIS — N1330 Unspecified hydronephrosis: Secondary | ICD-10-CM

## 2012-04-15 DIAGNOSIS — N390 Urinary tract infection, site not specified: Secondary | ICD-10-CM | POA: Diagnosis not present

## 2012-04-16 ENCOUNTER — Other Ambulatory Visit: Payer: Self-pay | Admitting: Radiology

## 2012-04-16 ENCOUNTER — Telehealth: Payer: Self-pay | Admitting: Physician Assistant

## 2012-04-16 MED ORDER — PITAVASTATIN CALCIUM 2 MG PO TABS: 2.0000 mg | ORAL_TABLET | Freq: Once | ORAL | Status: DC

## 2012-04-16 NOTE — Telephone Encounter (Signed)
Confirm sent to right pharmacy at [facility] livalo was rx. Looks like kidney level was drawn today. I do want to make sure that Kidney function is maintaing at a stable level while on this medication for cholesterol if I see that it is not or not drawn today then we need to have drawn in near future.

## 2012-04-16 NOTE — Telephone Encounter (Signed)
Spoke with pt.  [facility] [address] is the [facility].  He states that he doesn't know anything about any labs being ordered.  He also said to tell you that he is having "some procedure" this coming Tuesday with contrast to check for kidney blockages.  I told him that we would need to get some labs drawn.  Med faxed to pharmacy.

## 2012-04-19 ENCOUNTER — Encounter (HOSPITAL_COMMUNITY): Payer: Self-pay | Admitting: Pharmacy Technician

## 2012-04-20 ENCOUNTER — Encounter (HOSPITAL_COMMUNITY): Payer: Self-pay

## 2012-04-20 ENCOUNTER — Ambulatory Visit (HOSPITAL_COMMUNITY)
Admission: RE | Admit: 2012-04-20 | Discharge: 2012-04-20 | Disposition: A | Discharge status: Home / Self Care | Payer: Medicare Other | Source: Ambulatory Visit | Attending: Urology | Admitting: Urology

## 2012-04-20 ENCOUNTER — Other Ambulatory Visit: Payer: Self-pay | Admitting: Urology

## 2012-04-20 DIAGNOSIS — N1330 Unspecified hydronephrosis: Secondary | ICD-10-CM

## 2012-04-20 DIAGNOSIS — E785 Hyperlipidemia, unspecified: Secondary | ICD-10-CM | POA: Insufficient documentation

## 2012-04-20 DIAGNOSIS — N400 Enlarged prostate without lower urinary tract symptoms: Secondary | ICD-10-CM | POA: Insufficient documentation

## 2012-04-20 DIAGNOSIS — I10 Essential (primary) hypertension: Secondary | ICD-10-CM | POA: Diagnosis not present

## 2012-04-20 LAB — BASIC METABOLIC PANEL
BUN: 21 mg/dL (ref 6–23)
CO2: 25 mEq/L (ref 19–32)
Calcium: 9.3 mg/dL (ref 8.4–10.5)
Chloride: 99 mEq/L (ref 96–112)
Creatinine, Ser: 1.54 mg/dL — ABNORMAL HIGH (ref 0.50–1.35)
GFR calc Af Amer: 51 mL/min — ABNORMAL LOW (ref >90)
GFR calc non Af Amer: 44 mL/min — ABNORMAL LOW (ref >90)
Glucose, Bld: 111 mg/dL — ABNORMAL HIGH (ref 70–99)
Potassium: 3.5 mEq/L (ref 3.5–5.1)
Sodium: 138 mEq/L (ref 135–145)

## 2012-04-20 LAB — CBC WITH DIFFERENTIAL/PLATELET
Basophils Absolute: 0 10*3/uL (ref 0.0–0.1)
Basophils Relative: 0 % (ref 0–1)
Eosinophils Absolute: 0.1 10*3/uL (ref 0.0–0.7)
Eosinophils Relative: 1 % (ref 0–5)
HCT: 38.9 % — ABNORMAL LOW (ref 39.0–52.0)
Hemoglobin: 13.2 g/dL (ref 13.0–17.0)
Lymphocytes Relative: 37 % (ref 12–46)
Lymphs Abs: 2 10*3/uL (ref 0.7–4.0)
MCH: 32.8 pg (ref 26.0–34.0)
MCHC: 33.9 g/dL (ref 30.0–36.0)
MCV: 96.5 fL (ref 78.0–100.0)
Monocytes Absolute: 0.7 10*3/uL (ref 0.1–1.0)
Monocytes Relative: 12 % (ref 3–12)
Neutro Abs: 2.7 10*3/uL (ref 1.7–7.7)
Neutrophils Relative %: 50 % (ref 43–77)
Platelets: 320 10*3/uL (ref 150–400)
RBC: 4.03 MIL/uL — ABNORMAL LOW (ref 4.22–5.81)
RDW: 12.8 % (ref 11.5–15.5)
WBC: 5.4 10*3/uL (ref 4.0–10.5)

## 2012-04-20 LAB — APTT: aPTT: 28 seconds (ref 24–37)

## 2012-04-20 LAB — PROTIME-INR
INR: 0.96 (ref 0.00–1.49)
Prothrombin Time: 12.7 seconds (ref 11.6–15.2)

## 2012-04-20 MED ORDER — CIPROFLOXACIN IN D5W 400 MG/200ML IV SOLN
400.0000 mg | Freq: Once | INTRAVENOUS | Status: AC
  Administered 2012-04-20: 400 mg via INTRAVENOUS
  Filled 2012-04-20: qty 200

## 2012-04-20 MED ORDER — IOHEXOL 300 MG/ML  SOLN
50.0000 mL | Freq: Once | INTRAMUSCULAR | Status: AC | PRN
  Administered 2012-04-20: 25 mL

## 2012-04-20 MED ORDER — MIDAZOLAM HCL 2 MG/2ML IJ SOLN
INTRAMUSCULAR | Status: AC
  Filled 2012-04-20: qty 6

## 2012-04-20 MED ORDER — MIDAZOLAM HCL 2 MG/2ML IJ SOLN
INTRAMUSCULAR | Status: AC | PRN
  Administered 2012-04-20 (×2): 2 mg via INTRAVENOUS

## 2012-04-20 MED ORDER — FENTANYL CITRATE 0.05 MG/ML IJ SOLN
INTRAMUSCULAR | Status: AC | PRN
  Administered 2012-04-20: 100 ug via INTRAVENOUS

## 2012-04-20 MED ORDER — FENTANYL CITRATE 0.05 MG/ML IJ SOLN
INTRAMUSCULAR | Status: AC
  Filled 2012-04-20: qty 6

## 2012-04-20 MED ORDER — SODIUM CHLORIDE 0.9 % IV SOLN: INTRAVENOUS | Status: DC

## 2012-04-20 NOTE — Progress Notes (Signed)
Patient ambulated in hallway and used the restroom. Patient denied SOB, pain and dizziness. Left nephrostomy tube in place, dressing is CDI. Patient has no complaints at this time.

## 2012-04-20 NOTE — Procedures (Signed)
Successful placement of left sided 10 Fr PCN.  No immediate complications.

## 2012-04-20 NOTE — H&P (Signed)
Albert Hicks is an 72 y.o. male.   Chief Complaint: One occasion of hematuria 2 months ago Dr Brunilda Payor has seen pt; work up revealed enlarged prostate and Lt hydronephrosis Unsuccessful retrograde ureteral stent placement Scheduled now for Left percutaneous nephrostomy placement HPI: BPH; HLD; HTN; hematuria; elevated PSA  Past Medical History  Diagnosis Date  . Hyperlipidemia   . Hypertension   . Hydronephrosis, left   . Gross hematuria   . BPH (benign prostatic hypertrophy)   . Elevated PSA   . Acid reflux OCCASIONALLY TAKE TUMS  . OA (osteoarthritis) knees    Past Surgical History  Procedure Date  . Hip surgery     Replacement  . Total hip arthroplasty 07-09-2005  DR ALUISIO    RIGHT HIP OA  . Left knee surgery 1993  (APPROX)  . Appendectomy AGE 24  . Cataract extraction w/ intraocular lens implant 2013    RIGHT EYE  . Cystoscopy 02/13/2012    Procedure: CYSTOSCOPY FLEXIBLE;  Surgeon: Lindaann Slough, MD;  Location: [facility];  Service: Urology;  Laterality: N/A;    Family History  Problem Relation Age of Onset  . Hypertension Mother   . Cancer Father     prostate  . Hypertension Father   . Diabetes Father    Social History:  reports that he has never smoked. He has never used smokeless tobacco. He reports that he drinks about 1.8 ounces of alcohol per week. He reports that he does not use illicit drugs.  Allergies:  Allergies  Allergen Reactions  . Lipitor (Atorvastatin)     Muscle ache     (Not in a hospital admission)  Results for orders placed during the hospital encounter of 04/20/12 (from the past 48 hour(s))  APTT     Status: Normal   Collection Time   04/20/12 12:10 PM      Component Value Range Comment   aPTT 28  24 - 37 seconds   BASIC METABOLIC PANEL     Status: Abnormal   Collection Time   04/20/12 12:10 PM      Component Value Range Comment   Sodium 138  135 - 145 mEq/L    Potassium 3.5  3.5 - 5.1 mEq/L    Chloride 99  96 -  112 mEq/L    CO2 25  19 - 32 mEq/L    Glucose, Bld 111 (*) 70 - 99 mg/dL    BUN 21  6 - 23 mg/dL    Creatinine, Ser 7.82 (*) 0.50 - 1.35 mg/dL    Calcium 9.3  8.4 - 95.6 mg/dL    GFR calc non Af Amer 44 (*) >90 mL/min    GFR calc Af Amer 51 (*) >90 mL/min   CBC WITH DIFFERENTIAL     Status: Abnormal   Collection Time   04/20/12 12:10 PM      Component Value Range Comment   WBC 5.4  4.0 - 10.5 K/uL    RBC 4.03 (*) 4.22 - 5.81 MIL/uL    Hemoglobin 13.2  13.0 - 17.0 g/dL    HCT 21.3 (*) 08.6 - 52.0 %    MCV 96.5  78.0 - 100.0 fL    MCH 32.8  26.0 - 34.0 pg    MCHC 33.9  30.0 - 36.0 g/dL    RDW 57.8  46.9 - 62.9 %    Platelets 320  150 - 400 K/uL    Neutrophils Relative 50  43 - 77 %  Neutro Abs 2.7  1.7 - 7.7 K/uL    Lymphocytes Relative 37  12 - 46 %    Lymphs Abs 2.0  0.7 - 4.0 K/uL    Monocytes Relative 12  3 - 12 %    Monocytes Absolute 0.7  0.1 - 1.0 K/uL    Eosinophils Relative 1  0 - 5 %    Eosinophils Absolute 0.1  0.0 - 0.7 K/uL    Basophils Relative 0  0 - 1 %    Basophils Absolute 0.0  0.0 - 0.1 K/uL   PROTIME-INR     Status: Normal   Collection Time   04/20/12 12:10 PM      Component Value Range Comment   Prothrombin Time 12.7  11.6 - 15.2 seconds    INR 0.96  0.00 - 1.49    No results found.  Review of Systems  Constitutional: Negative for fever.  Respiratory: Negative for shortness of breath.   Cardiovascular: Negative for chest pain.  Gastrointestinal: Negative for nausea, vomiting and abdominal pain.  Genitourinary: Positive for hematuria.       1 occasion 2 mos ago  Neurological: Negative for weakness and headaches.    There were no vitals taken for this visit. Physical Exam  Constitutional: He is oriented to person, place, and time. He appears well-developed and well-nourished.  Cardiovascular: Normal rate, regular rhythm and normal heart sounds.   No murmur heard. Respiratory: Effort normal and breath sounds normal. He has no wheezes.  GI: Soft.  Bowel sounds are normal. There is no tenderness.  Musculoskeletal: Normal range of motion.  Neurological: He is alert and oriented to person, place, and time.  Skin: Skin is warm and dry.  Psychiatric: He has a normal mood and affect. His behavior is normal. Judgment and thought content normal.     Assessment/Plan Left hydronephrosis Hx hematuria; BPH Unsuccessful retrograde stent with Dr Brunilda Payor Scheduled now for L PCN in IR Pt aware of procedure benefits and risks and agreeable to proceed Consent signed and in chart  Axavier Pressley A 04/20/2012, 1:20 PM

## 2012-04-20 NOTE — Progress Notes (Signed)
Pam turpin PA (radiology) here did drain care and flush care c wife  Some supplies given and RX to get 10cc NS filled syrienges

## 2012-04-23 ENCOUNTER — Ambulatory Visit (HOSPITAL_COMMUNITY): Payer: Self-pay

## 2012-04-29 ENCOUNTER — Encounter (HOSPITAL_COMMUNITY): Payer: Self-pay | Admitting: Pharmacy Technician

## 2012-05-03 ENCOUNTER — Other Ambulatory Visit: Payer: Self-pay | Admitting: Radiology

## 2012-05-04 DIAGNOSIS — H40019 Open angle with borderline findings, low risk, unspecified eye: Secondary | ICD-10-CM | POA: Diagnosis not present

## 2012-05-05 ENCOUNTER — Ambulatory Visit (HOSPITAL_COMMUNITY)
Admission: RE | Admit: 2012-05-05 | Discharge: 2012-05-05 | Disposition: A | Discharge status: Home / Self Care | Payer: Medicare Other | Source: Ambulatory Visit | Attending: Urology | Admitting: Urology

## 2012-05-05 ENCOUNTER — Inpatient Hospital Stay (HOSPITAL_COMMUNITY)
Admission: AD | Admit: 2012-05-05 | Discharge: 2012-05-06 | DRG: 694 | Disposition: A | Discharge status: Home / Self Care | Payer: Medicare Other | Source: Ambulatory Visit | Attending: Interventional Radiology | Admitting: Interventional Radiology

## 2012-05-05 ENCOUNTER — Encounter (HOSPITAL_COMMUNITY): Payer: Self-pay | Admitting: *Deleted

## 2012-05-05 ENCOUNTER — Encounter (HOSPITAL_COMMUNITY): Payer: Self-pay

## 2012-05-05 DIAGNOSIS — R112 Nausea with vomiting, unspecified: Secondary | ICD-10-CM | POA: Diagnosis not present

## 2012-05-05 DIAGNOSIS — M1710 Unilateral primary osteoarthritis, unspecified knee: Secondary | ICD-10-CM | POA: Diagnosis present

## 2012-05-05 DIAGNOSIS — N400 Enlarged prostate without lower urinary tract symptoms: Secondary | ICD-10-CM | POA: Diagnosis present

## 2012-05-05 DIAGNOSIS — Z87898 Personal history of other specified conditions: Secondary | ICD-10-CM

## 2012-05-05 DIAGNOSIS — M170 Bilateral primary osteoarthritis of knee: Secondary | ICD-10-CM

## 2012-05-05 DIAGNOSIS — N183 Chronic kidney disease, stage 3 unspecified: Secondary | ICD-10-CM | POA: Diagnosis present

## 2012-05-05 DIAGNOSIS — Z96649 Presence of unspecified artificial hip joint: Secondary | ICD-10-CM

## 2012-05-05 DIAGNOSIS — I129 Hypertensive chronic kidney disease with stage 1 through stage 4 chronic kidney disease, or unspecified chronic kidney disease: Secondary | ICD-10-CM | POA: Diagnosis present

## 2012-05-05 DIAGNOSIS — Z936 Other artificial openings of urinary tract status: Secondary | ICD-10-CM | POA: Diagnosis not present

## 2012-05-05 DIAGNOSIS — K219 Gastro-esophageal reflux disease without esophagitis: Secondary | ICD-10-CM | POA: Diagnosis present

## 2012-05-05 DIAGNOSIS — I119 Hypertensive heart disease without heart failure: Secondary | ICD-10-CM | POA: Diagnosis not present

## 2012-05-05 DIAGNOSIS — IMO0002 Reserved for concepts with insufficient information to code with codable children: Secondary | ICD-10-CM | POA: Diagnosis present

## 2012-05-05 DIAGNOSIS — E785 Hyperlipidemia, unspecified: Secondary | ICD-10-CM | POA: Diagnosis present

## 2012-05-05 DIAGNOSIS — N1330 Unspecified hydronephrosis: Principal | ICD-10-CM | POA: Diagnosis present

## 2012-05-05 DIAGNOSIS — I10 Essential (primary) hypertension: Secondary | ICD-10-CM

## 2012-05-05 DIAGNOSIS — N059 Unspecified nephritic syndrome with unspecified morphologic changes: Secondary | ICD-10-CM | POA: Diagnosis not present

## 2012-05-05 DIAGNOSIS — N2889 Other specified disorders of kidney and ureter: Secondary | ICD-10-CM

## 2012-05-05 LAB — PROTIME-INR
INR: 1.01 (ref 0.00–1.49)
Prothrombin Time: 13.2 seconds (ref 11.6–15.2)

## 2012-05-05 LAB — CBC
HCT: 38.3 % — ABNORMAL LOW (ref 39.0–52.0)
Hemoglobin: 12.9 g/dL — ABNORMAL LOW (ref 13.0–17.0)
MCH: 32.6 pg (ref 26.0–34.0)
MCHC: 33.7 g/dL (ref 30.0–36.0)
MCV: 96.7 fL (ref 78.0–100.0)
Platelets: 404 10*3/uL — ABNORMAL HIGH (ref 150–400)
RBC: 3.96 MIL/uL — ABNORMAL LOW (ref 4.22–5.81)
RDW: 12.6 % (ref 11.5–15.5)
WBC: 5.8 10*3/uL (ref 4.0–10.5)

## 2012-05-05 LAB — APTT: aPTT: 30 seconds (ref 24–37)

## 2012-05-05 LAB — BASIC METABOLIC PANEL
BUN: 17 mg/dL (ref 6–23)
CO2: 26 mEq/L (ref 19–32)
Calcium: 9.3 mg/dL (ref 8.4–10.5)
Chloride: 102 mEq/L (ref 96–112)
Creatinine, Ser: 1.41 mg/dL — ABNORMAL HIGH (ref 0.50–1.35)
GFR calc Af Amer: 56 mL/min — ABNORMAL LOW (ref >90)
GFR calc non Af Amer: 49 mL/min — ABNORMAL LOW (ref >90)
Glucose, Bld: 111 mg/dL — ABNORMAL HIGH (ref 70–99)
Potassium: 3.6 mEq/L (ref 3.5–5.1)
Sodium: 139 mEq/L (ref 135–145)

## 2012-05-05 MED ORDER — IOHEXOL 300 MG/ML  SOLN
50.0000 mL | Freq: Once | INTRAMUSCULAR | Status: AC | PRN
  Administered 2012-05-05: 50 mL

## 2012-05-05 MED ORDER — CIPROFLOXACIN IN D5W 400 MG/200ML IV SOLN
400.0000 mg | INTRAVENOUS | Status: DC
  Filled 2012-05-05: qty 200

## 2012-05-05 MED ORDER — MIDAZOLAM HCL 2 MG/2ML IJ SOLN
INTRAMUSCULAR | Status: DC | PRN
  Administered 2012-05-05: 2 mg via INTRAVENOUS
  Administered 2012-05-05 (×2): 1 mg via INTRAVENOUS

## 2012-05-05 MED ORDER — SODIUM CHLORIDE 0.9 % IV SOLN
INTRAVENOUS | Status: DC
  Administered 2012-05-05: 12:00:00 via INTRAVENOUS

## 2012-05-05 MED ORDER — MIDAZOLAM HCL 2 MG/2ML IJ SOLN
INTRAMUSCULAR | Status: AC
  Filled 2012-05-05: qty 4

## 2012-05-05 MED ORDER — ONDANSETRON HCL 4 MG/2ML IJ SOLN
4.0000 mg | Freq: Four times a day (QID) | INTRAMUSCULAR | Status: DC | PRN
  Administered 2012-05-06: 4 mg via INTRAVENOUS
  Filled 2012-05-05: qty 2

## 2012-05-05 MED ORDER — FENTANYL CITRATE 0.05 MG/ML IJ SOLN
INTRAMUSCULAR | Status: DC | PRN
  Administered 2012-05-05 (×2): 50 ug via INTRAVENOUS
  Administered 2012-05-05: 100 ug via INTRAVENOUS

## 2012-05-05 MED ORDER — MIDAZOLAM HCL 2 MG/2ML IJ SOLN
INTRAMUSCULAR | Status: AC
  Filled 2012-05-05: qty 2

## 2012-05-05 MED ORDER — FENTANYL CITRATE 0.05 MG/ML IJ SOLN
INTRAMUSCULAR | Status: AC
  Filled 2012-05-05: qty 2

## 2012-05-05 MED ORDER — ACETAMINOPHEN 325 MG PO TABS
650.0000 mg | ORAL_TABLET | Freq: Four times a day (QID) | ORAL | Status: DC | PRN
  Administered 2012-05-06: 650 mg via ORAL
  Filled 2012-05-05: qty 2

## 2012-05-05 MED ORDER — FENTANYL CITRATE 0.05 MG/ML IJ SOLN
INTRAMUSCULAR | Status: AC
  Filled 2012-05-05: qty 4

## 2012-05-05 NOTE — Progress Notes (Signed)
Pt has a Lt Nephrostomy tube to leg bag draining clear amber urine

## 2012-05-05 NOTE — H&P (Signed)
Albert Hicks is an 72 y.o. male.   Chief Complaint: L percutaneous nephrostomy placed 04/20/12 Pt had had hematuria Evaluation showed L hydronephrosis Now scheduled to exchange L PCN to double JJ stent HPI: HLD; HTN; BPH; elev PSA; GERD  Past Medical History  Diagnosis Date  . Hyperlipidemia   . Hypertension   . Hydronephrosis, left   . Gross hematuria   . BPH (benign prostatic hypertrophy)   . Elevated PSA   . Acid reflux OCCASIONALLY TAKE TUMS  . OA (osteoarthritis) knees    Past Surgical History  Procedure Laterality Date  . Hip surgery      Replacement  . Total hip arthroplasty  07-09-2005  DR ALUISIO    RIGHT HIP OA  . Left knee surgery  1993  (APPROX)  . Appendectomy  AGE 79  . Cataract extraction w/ intraocular lens implant  2013    RIGHT EYE  . Cystoscopy  02/13/2012    Procedure: CYSTOSCOPY FLEXIBLE;  Surgeon: Lindaann Slough, MD;  Location: [facility];  Service: Urology;  Laterality: N/A;    Family History  Problem Relation Age of Onset  . Hypertension Mother   . Cancer Father     prostate  . Hypertension Father   . Diabetes Father    Social History:  reports that he has never smoked. He has never used smokeless tobacco. He reports that he drinks about 1.8 ounces of alcohol per week. He reports that he does not use illicit drugs.  Allergies:  Allergies  Allergen Reactions  . Lipitor (Atorvastatin)     Muscle ache     (Not in a hospital admission)  No results found for this or any previous visit (from the past 48 hour(s)). No results found.  Review of Systems  Constitutional: Negative for fever and weight loss.  Respiratory: Negative for cough.   Cardiovascular: Negative for chest pain.  Gastrointestinal: Negative for nausea and vomiting.  Genitourinary: Negative for dysuria and hematuria.  Neurological: Negative for weakness and headaches.    Blood pressure 139/84, pulse 88, temperature 98.7 F (37.1 C), resp. rate 20, SpO2  95.00%. Physical Exam  Constitutional: He is oriented to person, place, and time.  Cardiovascular: Normal rate, regular rhythm and normal heart sounds.   No murmur heard. Respiratory: Effort normal and breath sounds normal. He has no wheezes.  GI: Soft. Bowel sounds are normal. There is no tenderness.  Musculoskeletal: Normal range of motion.  Neurological: He is alert and oriented to person, place, and time.  Skin: Skin is warm and dry.  Left PCN site clean and dry; NT Output clear yellow urine  Psychiatric: He has a normal mood and affect. His behavior is normal. Judgment and thought content normal.     Assessment/Plan L PCN placed 04/20/12 Doing well Output 300-500 daily; clear yellow Site clean and dry; NT Scheduled for exchange of PCN to JJ stent Pt aware of procedure benefits and risks and agreeable to proceed Consent signed and in chart  Chelcey Caputo A 05/05/2012, 12:08 PM

## 2012-05-05 NOTE — Procedures (Signed)
Successful exchange of existing left sided PCN to double J stent.  No immediate complications.

## 2012-05-05 NOTE — Progress Notes (Signed)
Patient Vomited abruptly about 100 cc clear fluid. States nausea came and passed .

## 2012-05-05 NOTE — Progress Notes (Signed)
Report given to Ally RN.

## 2012-05-06 DIAGNOSIS — N059 Unspecified nephritic syndrome with unspecified morphologic changes: Secondary | ICD-10-CM | POA: Diagnosis not present

## 2012-05-06 DIAGNOSIS — I119 Hypertensive heart disease without heart failure: Secondary | ICD-10-CM | POA: Diagnosis not present

## 2012-05-06 DIAGNOSIS — N1330 Unspecified hydronephrosis: Secondary | ICD-10-CM | POA: Diagnosis not present

## 2012-05-06 NOTE — Discharge Summary (Signed)
Physician Discharge Summary  Patient ID: Albert Hicks MRN: [id number] DOB/AGE: 08/29/1940 72 y.o.  Admit date: 05/05/2012 Discharge date: 05/06/2012  Admission Diagnoses:BPH, left hydronephrosis, s/p left ureteral stent placement 05/05/2012  Discharge Diagnoses: Primary:  BPH, left hydronephrosis, status post left ureteral stent placement 05/05/2012 Past Medical History  Diagnosis Date  . Hyperlipidemia   . Hypertension   . Hydronephrosis, left   . Gross hematuria   . BPH (benign prostatic hypertrophy)   . Elevated PSA   . Acid reflux OCCASIONALLY TAKE TUMS  . OA (osteoarthritis) knees     Discharged Condition: good  Hospital Course: Albert Hicks is a 72 year old black male, patient of Dr. Su Grand, with history of BPH and left hydronephrosis. The patient underwent successful left percutaneous nephrostomy on 04/20/2012. He presented to [facility] interventional radiology department on 05/05/2012 for elective outpatient left ureteral stent placement. On 05/05/2012 the patient underwent successful left ureteral stent placement by Dr. Simonne Come. This was performed under IV conscious sedation and IV Cipro preprocedure. The patient tolerated the procedure well and was admitted for overnight observation secondary to weather related transportation issues postprocedure . The patient did experience a few episodes of nausea and vomiting post procedure which resolved with Zofran. On the morning of discharge the patient was stable. He tolerated breakfast well and was able to void without difficulty . He denied significant abdominal or flank pain, dyspnea or chills. Urine was slightly blood-tinged. The patient will be discharged home , resume home medications  and will contact Dr. Madilyn Hook office next week to arrange follow up visit.  Consults: none  Significant Diagnostic Studies:   Results for orders placed during the hospital encounter of 05/05/12  APTT      Result Value Range   aPTT 30  24 -  37 seconds  BASIC METABOLIC PANEL      Result Value Range   Sodium 139  135 - 145 mEq/L   Potassium 3.6  3.5 - 5.1 mEq/L   Chloride 102  96 - 112 mEq/L   CO2 26  19 - 32 mEq/L   Glucose, Bld 111 (*) 70 - 99 mg/dL   BUN 17  6 - 23 mg/dL   Creatinine, Ser 8.29 (*) 0.50 - 1.35 mg/dL   Calcium 9.3  8.4 - 56.2 mg/dL   GFR calc non Af Amer 49 (*) >90 mL/min   GFR calc Af Amer 56 (*) >90 mL/min  CBC      Result Value Range   WBC 5.8  4.0 - 10.5 K/uL   RBC 3.96 (*) 4.22 - 5.81 MIL/uL   Hemoglobin 12.9 (*) 13.0 - 17.0 g/dL   HCT 13.0 (*) 86.5 - 78.4 %   MCV 96.7  78.0 - 100.0 fL   MCH 32.6  26.0 - 34.0 pg   MCHC 33.7  30.0 - 36.0 g/dL   RDW 69.6  29.5 - 28.4 %   Platelets 404 (*) 150 - 400 K/uL  PROTIME-INR      Result Value Range   Prothrombin Time 13.2  11.6 - 15.2 seconds   INR 1.01  0.00 - 1.49   Ir Nephrostogram Left  05/05/2012  *RADIOLOGY REPORT*  Clinical Data: Left sided hydronephrosis  1.  LEFT SIDED NEPHROSTOGRAM. 2.  LEFT SIDED URETERAL STENT PLACEMENT  Comparison:  Left-sided percutaneous nephrostomy catheter placement - 02/18/2013  Intravenous medications:  Versed 4 mg IV; Fentanyl 200 mcg IV; Ciprofloxacin 400 mg IV the antibiotics were administered within 1 hour of  the procedure  Total Moderate Sedation Time: 25 minutes.  Contrast:  50 ml Omnipaque-300; administered into the urinary system  Fluoroscopy Time: 8.5 minutes.  Complications:  None immediate  Procedure:  The procedure, risks, benefits, and alternatives were explained to the patient.  Questions regarding the procedure were encouraged and answered.  The patient understands and consents to the procedure.  The nephrostomy tubes and surrounding skin were prepped with Betadine in a sterile fashion, and a sterile drape was applied covering the operative field.  A sterile gown and sterile gloves were used for the procedure. Local anesthesia was provided with 1% Lidocaine.  The preexisting left-sided nephrostomy tube was  injected with contrast material.  Fluoroscopic spot images were obtained of the collecting system and ureter to the level of the urinary bladder. The existing nephrostomy catheter was cut and cannulated with a Benson wire.  Under intermittent fluoroscopic guidance, the existing nephrostomy catheter was exchanged for a 9-French vascular sheath which was advanced into the renal pelvis and utilized to advance an additional Benson wire into the renal collecting system to be used as a Museum/gallery conservator.  The vascular sheath was exchanged for a Kumpe catheter which was manipulated to the level of the urinary bladder with the use of a stiff Glidewire.  The Kumpe catheter was utilized to estimate to appropriate length of the ureteral stent.  An 8-French, 28 cm ureteral stent was then advanced over an Amplatz superstiff guidewire.  The distal portion was formed at the level of the bladder.  Proximal portion was then formed at the level of the renal collecting system.  Contrast injection was performed demonstrating brisk passage of contrast to the double-J ureteral stent and into the urinary bladder. As such, the percutaneous access site was abandoned. A dressing was placed.  The patient tolerated procedure well without immediate postprocedural complication.  Findings:  Nephrostogram demonstrates appropriate positioning of the existing left-sided percutaneous nephrostomy catheter with resolution of previously noted marked hydronephrosis. There are no discrete irregular filling defects within the left ureter.  A left-sided ureteral stent was able to be placed spanning from the left renal collecting system to the bladder. Contrast injection confirmed appropriate positioning of functioning of the ureteral stent as such, percutaneous access was abandoned.  IMPRESSION:  Successful conversion of existing left sided nephrostomy catheter to a 28 cm ureteral stent.   Original Report Authenticated By: Tacey Ruiz, MD    Ir Perc  Nephrostomy Left  04/20/2012  *RADIOLOGY REPORT*  Indication: History of enlarged prostate, now with left sided hydronephrosis, post failed attempted retrograde ureteral stent placement  1.  ULTRASOUND GUIDANCE FOR PUNCTURE OF THE LEFT RENAL COLLECTING SYSTEM. 2.  LEFT PERCUTANEOUS NEPHROSTOMY TUBE PLACEMENT  Comparison: Renal ultrasound - 04/15/2012; CT abdomen pelvis - 02/07/2012 [facility] examination  Sedation: Fentanyl 150 mcg IV; Versed 4 mg IV; Ciprofloxacin 400 mg IV;  The antibiotic was administered in an appropriate time frame prior to skin puncture.  Contrast: 25 Omnipaque-300 in addition into the collecting system  Total Moderate Sedation Time: 40 minutes.  Fluoroscopy Time: 8.5 minutes.  Complications: None immediate  Procedure:  The procedure, risks, benefits, and alternatives were explained to the patient.  Questions regarding the procedure were encouraged and answered.  The patient understands and consents to the procedure. A timeout was performed prior to the initiation of the procedure.  The left flank region was prepped with Betadine in a sterile fashion, and a sterile drape was applied covering the operative field.  A sterile gown and sterile gloves were used for the procedure. Local anesthesia was provided with 1% Lidocaine with epinephrine.  Ultrasound was used to localize the  kidney.  Under direct ultrasound guidance, a 21 gauge needle was advanced into the renal collecting system.  An ultrasound image documentation was performed.  Access within the collecting system was confirmed with the efflux of urine followed by contrast injection.  Over a Nitrex wire, the tract was dilated with an Accustick stent. Over a Benson wire, the track was dilated and a 10 French nephrostomy catheter was attempted to be advanced into the collecting system, however as there was some resistance, the nephrostomy was exchanged for a Kumpe catheter.  Now over an Amplatz stiff guide wire, a 10  French nephrostomy catheter was advanced into the left renal pelvis, coiled and locked.  Contrast was injected and several sport radiographs were obtained in various obliquities confirming access.  The catheter was secured at the skin with a Prolene retention suture and a gravity bag was placed.  A dressing was placed.  The patient tolerated procedure well without immediate postprocedural complication.  Findings:  Ultrasound scanning demonstrates a severely dilated left-sided renal collecting system, no outside abdominal CT.  Under ultrasound guidance, a posterior inferior calix was targeted ultimately allowing placement of an 10-French percutaneous nephrostomy catheter under intermittent fluoroscopic guidance. Contrast injection confirmed appropriate positioning.  A small amount of postprocedural blood was noted within the collecting system. Incidental note is made of partial duplication of the left renal collecting system.  IMPRESSION:  Successful ultrasound and fluoroscopic guided placement of a left sided 10 French PCN.  Plan:  The patient may return at a later date for formal antegrade nephrostogram and potential internalization of the nephrostomy catheter.   Original Report Authenticated By: Tacey Ruiz, MD    Ir US Guide Bx Asp/drain  04/20/2012  *RADIOLOGY REPORT*  Indication: History of enlarged prostate, now with left sided hydronephrosis, post failed attempted retrograde ureteral stent placement  1.  ULTRASOUND GUIDANCE FOR PUNCTURE OF THE LEFT RENAL COLLECTING SYSTEM. 2.  LEFT PERCUTANEOUS NEPHROSTOMY TUBE PLACEMENT  Comparison: Renal ultrasound - 04/15/2012; CT abdomen pelvis - 02/07/2012 [facility] examination  Sedation: Fentanyl 150 mcg IV; Versed 4 mg IV; Ciprofloxacin 400 mg IV;  The antibiotic was administered in an appropriate time frame prior to skin puncture.  Contrast: 25 Omnipaque-300 in addition into the collecting system  Total Moderate Sedation Time: 40 minutes.   Fluoroscopy Time: 8.5 minutes.  Complications: None immediate  Procedure:  The procedure, risks, benefits, and alternatives were explained to the patient.  Questions regarding the procedure were encouraged and answered.  The patient understands and consents to the procedure. A timeout was performed prior to the initiation of the procedure.  The left flank region was prepped with Betadine in a sterile fashion, and a sterile drape was applied covering the operative field.  A sterile gown and sterile gloves were used for the procedure. Local anesthesia was provided with 1% Lidocaine with epinephrine.  Ultrasound was used to localize the  kidney.  Under direct ultrasound guidance, a 21 gauge needle was advanced into the renal collecting system.  An ultrasound image documentation was performed.  Access within the collecting system was confirmed with the efflux of urine followed by contrast injection.  Over a Nitrex wire, the tract was dilated with an Accustick stent. Over a Benson wire, the track was dilated and a 10 French nephrostomy catheter was attempted to be advanced  into the collecting system, however as there was some resistance, the nephrostomy was exchanged for a Kumpe catheter.  Now over an Amplatz stiff guide wire, a 10 French nephrostomy catheter was advanced into the left renal pelvis, coiled and locked.  Contrast was injected and several sport radiographs were obtained in various obliquities confirming access.  The catheter was secured at the skin with a Prolene retention suture and a gravity bag was placed.  A dressing was placed.  The patient tolerated procedure well without immediate postprocedural complication.  Findings:  Ultrasound scanning demonstrates a severely dilated left-sided renal collecting system, no outside abdominal CT.  Under ultrasound guidance, a posterior inferior calix was targeted ultimately allowing placement of an 10-French percutaneous nephrostomy catheter under intermittent  fluoroscopic guidance. Contrast injection confirmed appropriate positioning.  A small amount of postprocedural blood was noted within the collecting system. Incidental note is made of partial duplication of the left renal collecting system.  IMPRESSION:  Successful ultrasound and fluoroscopic guided placement of a left sided 10 French PCN.  Plan:  The patient may return at a later date for formal antegrade nephrostogram and potential internalization of the nephrostomy catheter.   Original Report Authenticated By: Tacey Ruiz, MD    Ir [place] Abts Cath Perc Left  05/05/2012  *RADIOLOGY REPORT*  Clinical Data: Left sided hydronephrosis  1.  LEFT SIDED NEPHROSTOGRAM. 2.  LEFT SIDED URETERAL STENT PLACEMENT  Comparison:  Left-sided percutaneous nephrostomy catheter placement - 02/18/2013  Intravenous medications:  Versed 4 mg IV; Fentanyl 200 mcg IV; Ciprofloxacin 400 mg IV the antibiotics were administered within 1 hour of the procedure  Total Moderate Sedation Time: 25 minutes.  Contrast:  50 ml Omnipaque-300; administered into the urinary system  Fluoroscopy Time: 8.5 minutes.  Complications:  None immediate  Procedure:  The procedure, risks, benefits, and alternatives were explained to the patient.  Questions regarding the procedure were encouraged and answered.  The patient understands and consents to the procedure.  The nephrostomy tubes and surrounding skin were prepped with Betadine in a sterile fashion, and a sterile drape was applied covering the operative field.  A sterile gown and sterile gloves were used for the procedure. Local anesthesia was provided with 1% Lidocaine.  The preexisting left-sided nephrostomy tube was injected with contrast material.  Fluoroscopic spot images were obtained of the collecting system and ureter to the level of the urinary bladder. The existing nephrostomy catheter was cut and cannulated with a Benson wire.  Under intermittent fluoroscopic guidance, the existing nephrostomy  catheter was exchanged for a 9-French vascular sheath which was advanced into the renal pelvis and utilized to advance an additional Benson wire into the renal collecting system to be used as a Museum/gallery conservator.  The vascular sheath was exchanged for a Kumpe catheter which was manipulated to the level of the urinary bladder with the use of a stiff Glidewire.  The Kumpe catheter was utilized to estimate to appropriate length of the ureteral stent.  An 8-French, 28 cm ureteral stent was then advanced over an Amplatz superstiff guidewire.  The distal portion was formed at the level of the bladder.  Proximal portion was then formed at the level of the renal collecting system.  Contrast injection was performed demonstrating brisk passage of contrast to the double-J ureteral stent and into the urinary bladder. As such, the percutaneous access site was abandoned. A dressing was placed.  The patient tolerated procedure well without immediate postprocedural complication.  Findings:  Nephrostogram demonstrates appropriate  positioning of the existing left-sided percutaneous nephrostomy catheter with resolution of previously noted marked hydronephrosis. There are no discrete irregular filling defects within the left ureter.  A left-sided ureteral stent was able to be placed spanning from the left renal collecting system to the bladder. Contrast injection confirmed appropriate positioning of functioning of the ureteral stent as such, percutaneous access was abandoned.  IMPRESSION:  Successful conversion of existing left sided nephrostomy catheter to a 28 cm ureteral stent.   Original Report Authenticated By: Tacey Ruiz, MD     Treatments: Left nephrostogram and left ureteral stent placement on 05/05/2012  Discharge Exam: Blood pressure 156/90, pulse 99, temperature 98.6 F (37 C), temperature source Oral, resp. rate 18, height 6\' 1"  (1.854 m), weight 217 lb (98.431 kg), SpO2 99.00%. Patient is awake, alert and oriented x3.  Chest is clear to auscultation bilaterally. Heart with regular rate and rhythm. Abdomen soft, nontender, positive bowel sounds. Puncture site left flank covered with clean, intact gauze dressing. Extremities with full range of motion and no significant edema.  Disposition: Home  Discharge Orders   Future Orders Complete By Expires     Call MD for:  difficulty breathing, headache or visual disturbances  As directed     Call MD for:  hives  As directed     Call MD for:  persistant dizziness or light-headedness  As directed     Call MD for:  persistant nausea and vomiting  As directed     Call MD for:  redness, tenderness, or signs of infection (pain, swelling, redness, odor or green/yellow discharge around incision site)  As directed     Call MD for:  severe uncontrolled pain  As directed     Call MD for:  temperature >100.4  As directed     Diet - low sodium heart healthy  As directed     Driving Restrictions  As directed     Comments:      No driving for next 24 hours    Increase activity slowly  As directed     Lifting restrictions  As directed     Comments:      No heavy lifting for next 48 hours    May shower / Bathe  As directed     May walk up steps  As directed     Remove dressing in 24 hours  As directed     Scheduling Instructions:      Change dressing on left back daily for next 3 days; call Dr. Madilyn Hook office [phone] with any excessive drainage or bleeding from site        Medication List    TAKE these medications       acetaminophen 500 MG tablet  Commonly known as:  TYLENOL  Take 1,000 mg by mouth every 6 (six) hours as needed. For pain     amLODipine 5 MG tablet  Commonly known as:  NORVASC  Take 5 mg by mouth every morning.     hydrochlorothiazide 25 MG tablet  Commonly known as:  HYDRODIURIL  Take 25 mg by mouth every morning.     LIVALO 2 MG Tabs  Generic drug:  Pitavastatin Calcium  Take 2 mg by mouth daily.     omega-3 acid ethyl esters 1 G capsule   Commonly known as:  LOVAZA  Take 2 g by mouth daily.           Follow-up Information   Follow up with NESI,MARC-HENRY,  MD. (call Dr. Madilyn Hook office on 2/17 to arrange follow up visit)    Contact information:   [address] [place] [address] [phone]       Signed: Chinita Pester 05/06/2012, 10:04 AM

## 2012-05-09 ENCOUNTER — Inpatient Hospital Stay (HOSPITAL_COMMUNITY)
Admission: AD | Admit: 2012-05-09 | Discharge: 2012-05-13 | DRG: 872 | Disposition: A | Discharge status: Home / Self Care | Payer: Medicare Other | Source: Other Acute Inpatient Hospital | Attending: Internal Medicine | Admitting: Internal Medicine

## 2012-05-09 ENCOUNTER — Inpatient Hospital Stay (HOSPITAL_COMMUNITY): Payer: Medicare Other

## 2012-05-09 DIAGNOSIS — R109 Unspecified abdominal pain: Secondary | ICD-10-CM | POA: Diagnosis not present

## 2012-05-09 DIAGNOSIS — D72829 Elevated white blood cell count, unspecified: Secondary | ICD-10-CM | POA: Diagnosis present

## 2012-05-09 DIAGNOSIS — R918 Other nonspecific abnormal finding of lung field: Secondary | ICD-10-CM | POA: Diagnosis not present

## 2012-05-09 DIAGNOSIS — A419 Sepsis, unspecified organism: Secondary | ICD-10-CM | POA: Diagnosis not present

## 2012-05-09 DIAGNOSIS — R197 Diarrhea, unspecified: Secondary | ICD-10-CM

## 2012-05-09 DIAGNOSIS — E871 Hypo-osmolality and hyponatremia: Secondary | ICD-10-CM | POA: Diagnosis present

## 2012-05-09 DIAGNOSIS — N12 Tubulo-interstitial nephritis, not specified as acute or chronic: Secondary | ICD-10-CM | POA: Diagnosis not present

## 2012-05-09 DIAGNOSIS — N10 Acute tubulo-interstitial nephritis: Secondary | ICD-10-CM | POA: Diagnosis present

## 2012-05-09 DIAGNOSIS — N390 Urinary tract infection, site not specified: Secondary | ICD-10-CM

## 2012-05-09 DIAGNOSIS — E876 Hypokalemia: Secondary | ICD-10-CM | POA: Diagnosis present

## 2012-05-09 DIAGNOSIS — N400 Enlarged prostate without lower urinary tract symptoms: Secondary | ICD-10-CM | POA: Diagnosis not present

## 2012-05-09 DIAGNOSIS — I129 Hypertensive chronic kidney disease with stage 1 through stage 4 chronic kidney disease, or unspecified chronic kidney disease: Secondary | ICD-10-CM | POA: Diagnosis present

## 2012-05-09 DIAGNOSIS — N2889 Other specified disorders of kidney and ureter: Secondary | ICD-10-CM

## 2012-05-09 DIAGNOSIS — R509 Fever, unspecified: Secondary | ICD-10-CM | POA: Diagnosis not present

## 2012-05-09 DIAGNOSIS — N3090 Cystitis, unspecified without hematuria: Secondary | ICD-10-CM | POA: Diagnosis not present

## 2012-05-09 DIAGNOSIS — I10 Essential (primary) hypertension: Secondary | ICD-10-CM | POA: Diagnosis present

## 2012-05-09 DIAGNOSIS — Z96649 Presence of unspecified artificial hip joint: Secondary | ICD-10-CM

## 2012-05-09 DIAGNOSIS — N1330 Unspecified hydronephrosis: Secondary | ICD-10-CM | POA: Diagnosis not present

## 2012-05-09 DIAGNOSIS — E785 Hyperlipidemia, unspecified: Secondary | ICD-10-CM | POA: Diagnosis present

## 2012-05-09 DIAGNOSIS — N183 Chronic kidney disease, stage 3 unspecified: Secondary | ICD-10-CM | POA: Diagnosis not present

## 2012-05-09 LAB — CBC WITH DIFFERENTIAL/PLATELET
Basophils Absolute: 0 10*3/uL (ref 0.0–0.1)
Basophils Relative: 0 % (ref 0–1)
Eosinophils Absolute: 0 10*3/uL (ref 0.0–0.7)
Eosinophils Relative: 0 % (ref 0–5)
HCT: 34.7 % — ABNORMAL LOW (ref 39.0–52.0)
Hemoglobin: 12.1 g/dL — ABNORMAL LOW (ref 13.0–17.0)
Lymphocytes Relative: 6 % — ABNORMAL LOW (ref 12–46)
Lymphs Abs: 0.8 10*3/uL (ref 0.7–4.0)
MCH: 33.2 pg (ref 26.0–34.0)
MCHC: 34.9 g/dL (ref 30.0–36.0)
MCV: 95.1 fL (ref 78.0–100.0)
Monocytes Absolute: 2.1 10*3/uL — ABNORMAL HIGH (ref 0.1–1.0)
Monocytes Relative: 15 % — ABNORMAL HIGH (ref 3–12)
Neutro Abs: 11.5 10*3/uL — ABNORMAL HIGH (ref 1.7–7.7)
Neutrophils Relative %: 80 % — ABNORMAL HIGH (ref 43–77)
Platelets: 291 10*3/uL (ref 150–400)
RBC: 3.65 MIL/uL — ABNORMAL LOW (ref 4.22–5.81)
RDW: 12.6 % (ref 11.5–15.5)
WBC: 14.4 10*3/uL — ABNORMAL HIGH (ref 4.0–10.5)

## 2012-05-09 LAB — COMPREHENSIVE METABOLIC PANEL
ALT: 31 U/L (ref 0–53)
AST: 31 U/L (ref 0–37)
Albumin: 3.1 g/dL — ABNORMAL LOW (ref 3.5–5.2)
Alkaline Phosphatase: 62 U/L (ref 39–117)
BUN: 23 mg/dL (ref 6–23)
CO2: 24 mEq/L (ref 19–32)
Calcium: 8.6 mg/dL (ref 8.4–10.5)
Chloride: 99 mEq/L (ref 96–112)
Creatinine, Ser: 1.76 mg/dL — ABNORMAL HIGH (ref 0.50–1.35)
GFR calc Af Amer: 43 mL/min — ABNORMAL LOW (ref >90)
GFR calc non Af Amer: 37 mL/min — ABNORMAL LOW (ref >90)
Glucose, Bld: 117 mg/dL — ABNORMAL HIGH (ref 70–99)
Potassium: 3.3 mEq/L — ABNORMAL LOW (ref 3.5–5.1)
Sodium: 132 mEq/L — ABNORMAL LOW (ref 135–145)
Total Bilirubin: 0.7 mg/dL (ref 0.3–1.2)
Total Protein: 7.4 g/dL (ref 6.0–8.3)

## 2012-05-09 LAB — PROCALCITONIN: Procalcitonin: 1.12 ng/mL

## 2012-05-09 LAB — PROTIME-INR
INR: 1.18 (ref 0.00–1.49)
Prothrombin Time: 14.8 seconds (ref 11.6–15.2)

## 2012-05-09 LAB — PHOSPHORUS: Phosphorus: 2.9 mg/dL (ref 2.3–4.6)

## 2012-05-09 LAB — APTT: aPTT: 34 seconds (ref 24–37)

## 2012-05-09 LAB — LACTIC ACID, PLASMA: Lactic Acid, Venous: 1 mmol/L (ref 0.5–2.2)

## 2012-05-09 LAB — MAGNESIUM: Magnesium: 1.8 mg/dL (ref 1.5–2.5)

## 2012-05-09 MED ORDER — HYDROCODONE-ACETAMINOPHEN 5-325 MG PO TABS: 1.0000 | ORAL_TABLET | ORAL | Status: DC | PRN

## 2012-05-09 MED ORDER — ONDANSETRON HCL 4 MG PO TABS: 4.0000 mg | ORAL_TABLET | Freq: Four times a day (QID) | ORAL | Status: DC | PRN

## 2012-05-09 MED ORDER — ACETAMINOPHEN 325 MG PO TABS
650.0000 mg | ORAL_TABLET | Freq: Four times a day (QID) | ORAL | Status: DC | PRN
  Administered 2012-05-09 – 2012-05-10 (×4): 650 mg via ORAL
  Filled 2012-05-09 (×4): qty 2

## 2012-05-09 MED ORDER — ONDANSETRON HCL 4 MG/2ML IJ SOLN: 4.0000 mg | Freq: Four times a day (QID) | INTRAMUSCULAR | Status: DC | PRN

## 2012-05-09 MED ORDER — SIMVASTATIN 20 MG PO TABS
20.0000 mg | ORAL_TABLET | Freq: Every day | ORAL | Status: DC
  Administered 2012-05-10 – 2012-05-11 (×2): 20 mg via ORAL
  Filled 2012-05-09 (×4): qty 1

## 2012-05-09 MED ORDER — POTASSIUM CHLORIDE CRYS ER 20 MEQ PO TBCR
40.0000 meq | EXTENDED_RELEASE_TABLET | Freq: Once | ORAL | Status: AC
  Administered 2012-05-09: 40 meq via ORAL
  Filled 2012-05-09: qty 2

## 2012-05-09 MED ORDER — VANCOMYCIN HCL IN DEXTROSE 1-5 GM/200ML-% IV SOLN
1000.0000 mg | Freq: Once | INTRAVENOUS | Status: AC
  Administered 2012-05-09: 1000 mg via INTRAVENOUS
  Filled 2012-05-09 (×2): qty 200

## 2012-05-09 MED ORDER — MORPHINE SULFATE 2 MG/ML IJ SOLN: 1.0000 mg | INTRAMUSCULAR | Status: DC | PRN

## 2012-05-09 MED ORDER — SODIUM CHLORIDE 0.9 % IV SOLN
INTRAVENOUS | Status: DC
  Administered 2012-05-09 – 2012-05-12 (×6): via INTRAVENOUS

## 2012-05-09 MED ORDER — OMEGA-3-ACID ETHYL ESTERS 1 G PO CAPS
2.0000 g | ORAL_CAPSULE | Freq: Every day | ORAL | Status: DC
  Administered 2012-05-10 – 2012-05-13 (×4): 2 g via ORAL
  Filled 2012-05-09 (×4): qty 2

## 2012-05-09 MED ORDER — AMLODIPINE BESYLATE 5 MG PO TABS
5.0000 mg | ORAL_TABLET | Freq: Every morning | ORAL | Status: DC
  Administered 2012-05-10 – 2012-05-13 (×4): 5 mg via ORAL
  Filled 2012-05-09 (×4): qty 1

## 2012-05-09 MED ORDER — HYDROCHLOROTHIAZIDE 25 MG PO TABS
25.0000 mg | ORAL_TABLET | Freq: Every morning | ORAL | Status: DC
Start: 2012-05-10 — End: 2012-05-10
  Administered 2012-05-10: 25 mg via ORAL
  Filled 2012-05-09: qty 1

## 2012-05-09 MED ORDER — PIPERACILLIN-TAZOBACTAM 3.375 G IVPB
3.3750 g | Freq: Three times a day (TID) | INTRAVENOUS | Status: DC
  Administered 2012-05-10 – 2012-05-13 (×10): 3.375 g via INTRAVENOUS
  Filled 2012-05-09 (×12): qty 50

## 2012-05-09 MED ORDER — VANCOMYCIN HCL 10 G IV SOLR
1250.0000 mg | INTRAVENOUS | Status: DC
  Administered 2012-05-10 – 2012-05-12 (×3): 1250 mg via INTRAVENOUS
  Filled 2012-05-09 (×3): qty 1250

## 2012-05-09 MED ORDER — PIPERACILLIN-TAZOBACTAM 3.375 G IVPB
3.3750 g | Freq: Once | INTRAVENOUS | Status: AC
  Administered 2012-05-09: 3.375 g via INTRAVENOUS
  Filled 2012-05-09 (×2): qty 50

## 2012-05-09 MED ORDER — ACETAMINOPHEN 650 MG RE SUPP: 650.0000 mg | Freq: Four times a day (QID) | RECTAL | Status: DC | PRN

## 2012-05-09 MED ORDER — ATORVASTATIN CALCIUM 10 MG PO TABS: 10.0000 mg | ORAL_TABLET | Freq: Every day | ORAL | Status: DC

## 2012-05-09 NOTE — H&P (Addendum)
[facility] History and Physical  Albert Hicks WGN:[id number] DOB: [birth year] DOA: 05/09/2012  Referring physician: ER physician PCP: Tandy Gaw, PA-C   Chief Complaint: fever and left flank pain  HPI:  72 year old gentleman with past medical history of HTN, Dyslipidemia and BPH, recent left hydronephrosis and status post successful left percutaneous nephrostomy on 04/20/2012, had hematuria and then underwent left ureteral stent placement 05/05/2012.   Patient was in [place] [facility] but was transferred to [facility] as urology will follow up once patient in [facility]. Patient now presents with fever and left flank pain started on the morning of admission.. Fever at home 104 F. No reports of hematuria, no nausea or vomiting. No chest pain, no shortness of breath, no palpitations. No cough, no lightheadedness or loss of consciousness.  We do not have admission labs or imaging studies available  Assessment and Plan:  Principal Problem:   Acute Pyelonephritis  Started on empiric antibiotcs until culture results available: vanco and zosyn   Follow up blood culture and urine culture results  Check lactic acid and procalcitonin level. Patient may  Be septic as he still has fever even with antibiotics given in ED. Active Problems:   Hyperlipidemia  Continue atorvastatin   Hypertension  Continue Norvasc and Hctz   CKD (chronic kidney disease) stage 3, GFR 30-59 ml/min  Creatinine 1.4 05/05/2012 and in 03/2011 1.54  We need admission labs  Will start IV fluids  Code Status: Full Family Communication: Pt at bedside Disposition Plan: Admit for further evaluation  Manson Passey, MD  [facility] Pager [phone]  If 7PM-7AM, please contact night-coverage [email or web address] Password [email or web address] 05/09/2012, 7:07 PM   Review of Systems:  Constitutional: positive for fever, chills and  Negative for malaise/fatigue. Negative for diaphoresis.  HENT: Negative for hearing loss, ear pain, nosebleeds,  congestion, sore throat, neck pain, tinnitus and ear discharge.   Eyes: Negative for blurred vision, double vision, photophobia, pain, discharge and redness.  Respiratory: Negative for cough, hemoptysis, sputum production, shortness of breath, wheezing and stridor.   Cardiovascular: Negative for chest pain, palpitations, orthopnea, claudication and leg swelling.  Gastrointestinal: Negative for nausea, vomiting and abdominal pain. Negative for heartburn, constipation, blood in stool and melena.  Genitourinary: Negative for dysuria, urgency, frequency, hematuria and positive for flank pain.  Musculoskeletal: Negative for myalgias, back pain, joint pain and falls.  Skin: Negative for itching and rash.  Neurological: Negative for dizziness and weakness. Negative for tingling, tremors, sensory change, speech change, focal weakness, loss of consciousness and headaches.  Endo/Heme/Allergies: Negative for environmental allergies and polydipsia. Does not bruise/bleed easily.  Psychiatric/Behavioral: Negative for suicidal ideas. The patient is not nervous/anxious.      Past Medical History  Diagnosis Date  . Hyperlipidemia   . Hypertension   . Hydronephrosis, left   . Gross hematuria   . BPH (benign prostatic hypertrophy)   . Elevated PSA   . Acid reflux OCCASIONALLY TAKE TUMS  . OA (osteoarthritis) knees   Past Surgical History  Procedure Laterality Date  . Hip surgery      Replacement  . Total hip arthroplasty  07-09-2005  DR ALUISIO    RIGHT HIP OA  . Left knee surgery  1993  (APPROX)  . Appendectomy  AGE 55  . Cataract extraction w/ intraocular lens implant  2013    RIGHT EYE  . Cystoscopy  02/13/2012    Procedure: CYSTOSCOPY FLEXIBLE;  Surgeon: Lindaann Slough, MD;  Location: [facility];  Service: Urology;  Laterality: N/A;  Social History:  reports that he has never smoked. He has never used smokeless tobacco. He reports that he drinks about 1.8 ounces of alcohol per  week. He reports that he does not use illicit drugs.  Allergies  Allergen Reactions  . Lipitor (Atorvastatin)     Muscle ache    Family History:  Family History  Problem Relation Age of Onset  . Hypertension Mother   . Cancer Father     prostate  . Hypertension Father   . Diabetes Father      Prior to Admission medications   Medication Sig Start Date End Date Taking? Authorizing Provider  acetaminophen (TYLENOL) 500 MG tablet Take 1,000 mg by mouth every 6 (six) hours as needed. For pain    Historical Provider, MD  amLODipine (NORVASC) 5 MG tablet Take 5 mg by mouth every morning. 02/23/12   Jade L Breeback, PA-C  hydrochlorothiazide (HYDRODIURIL) 25 MG tablet Take 25 mg by mouth every morning. 02/23/12   Jade L Breeback, PA-C  omega-3 acid ethyl esters (LOVAZA) 1 G capsule Take 2 g by mouth daily.    Historical Provider, MD  Pitavastatin Calcium (LIVALO) 2 MG TABS Take 2 mg by mouth daily.    Historical Provider, MD   Physical Exam: Filed Vitals:   05/09/12 1900  BP: 137/62  Pulse: 91  Temp: 102.9 F (39.4 C)  TempSrc: Oral  Resp: 18  SpO2: 99%    Physical Exam  Constitutional: Appears well-developed and well-nourished. No distress.  HENT: Normocephalic. External right and left ear normal. Oropharynx is clear and moist.  Eyes: Conjunctivae and EOM are normal. PERRLA, no scleral icterus.  Neck: Normal ROM. Neck supple. No JVD. No tracheal deviation. No thyromegaly.  CVS: RRR, S1/S2 +, no murmurs, no gallops, no carotid bruit.  Pulmonary: Effort and breath sounds normal, no stridor, rhonchi, wheezes, rales.  Abdominal: Soft. BS +,  no distension, tenderness, rebound or guarding. Left CVAT Musculoskeletal: Normal range of motion. No edema and no tenderness.  Lymphadenopathy: No lymphadenopathy noted, cervical, inguinal. Neuro: Alert. Normal reflexes, muscle tone coordination. No cranial nerve deficit. Skin: Skin is warm and dry. No rash noted. Not diaphoretic. No  erythema. No pallor.  Psychiatric: Normal mood and affect. Behavior, judgment, thought content normal.   Labs on Admission:  Basic Metabolic Panel:  Recent Labs Lab 05/05/12 1145  NA 139  K 3.6  CL 102  CO2 26  GLUCOSE 111*  BUN 17  CREATININE 1.41*  CALCIUM 9.3   Liver Function Tests: No results found for this basename: AST, ALT, ALKPHOS, BILITOT, PROT, ALBUMIN,  in the last 168 hours No results found for this basename: LIPASE, AMYLASE,  in the last 168 hours No results found for this basename: AMMONIA,  in the last 168 hours CBC:  Recent Labs Lab 05/05/12 1145  WBC 5.8  HGB 12.9*  HCT 38.3*  MCV 96.7  PLT 404*   Cardiac Enzymes: No results found for this basename: CKTOTAL, CKMB, CKMBINDEX, TROPONINI,  in the last 168 hours BNP: No components found with this basename: POCBNP,  CBG: No results found for this basename: GLUCAP,  in the last 168 hours  Radiological Exams on Admission: No results found.  Time spent: 75 minutes

## 2012-05-09 NOTE — Progress Notes (Addendum)
Dr. Jomarie Longs returned call and requested I call [phone] and request the floor manager page the admitting physician. Floor manger, Marisue Ivan, notified. Will make night RN aware.  Preliminary orders entered in EPIC.  Manson Passey, MD

## 2012-05-09 NOTE — Progress Notes (Signed)
Dr. Jomarie Longs page regarding pt arrival. Awaiting return page.

## 2012-05-09 NOTE — Progress Notes (Addendum)
ANTIBIOTIC CONSULT NOTE - INITIAL  Pharmacy Consult for Zosyn/Vancomycin Indication: pyelonephritis/sepsis  Allergies  Allergen Reactions  . Lipitor (Atorvastatin)     Muscle ache    Patient Measurements:     Vital Signs: Temp: 102.9 F (39.4 C) (02/16 1900) Temp src: Oral (02/16 1900) BP: 137/62 mmHg (02/16 1900) Pulse Rate: 91 (02/16 1900) Intake/Output from previous day:   Intake/Output from this shift:    Labs: No results found for this basename: WBC, HGB, PLT, LABCREA, CREATININE,  in the last 72 hours The CrCl is unknown because both a height and weight (above a minimum accepted value) are required for this calculation. No results found for this basename: VANCOTROUGH, VANCOPEAK, VANCORANDOM, GENTTROUGH, GENTPEAK, GENTRANDOM, TOBRATROUGH, TOBRAPEAK, TOBRARND, AMIKACINPEAK, AMIKACINTROU, AMIKACIN,  in the last 72 hours   Microbiology: No results found for this or any previous visit (from the past 720 hour(s)).  Medical History: Past Medical History  Diagnosis Date  . Hyperlipidemia   . Hypertension   . Hydronephrosis, left   . Gross hematuria   . BPH (benign prostatic hypertrophy)   . Elevated PSA   . Acid reflux OCCASIONALLY TAKE TUMS  . OA (osteoarthritis) knees    Medications:  Scheduled:  . [START ON 05/10/2012] amLODipine  5 mg Oral q morning - 10a  . [START ON 05/10/2012] hydrochlorothiazide  25 mg Oral q morning - 10a  . [START ON 05/10/2012] omega-3 acid ethyl esters  2 g Oral Daily  . [START ON 05/10/2012] simvastatin  20 mg Oral q1800  . [DISCONTINUED] atorvastatin  10 mg Oral q1800   Infusions:  . sodium chloride     Assessment:  72 yo male with L PCN placed on 1/28 and exchanged for JJ stent on 2/12 now presenting to ER with CC pyelonephritis/sepsis  No current SCr  To start vancomycin and Zosyn per pharmacy dosing  Goal of Therapy:  Vancomycin trough level 15-20 mcg/ml  Plan:  1) Zosyn 3.375g IV x 1  2) Vancomycin 1g IV x 1 3) Will  dose antibiotics further once renal function is known   Hessie Knows, PharmD, BCPS Pager [phone] 05/09/2012 7:16 PM   --------------------------------------------------------------------------------------------------------------------------------------------------------- Addendum:   Labs: Scr 1.76, est CrCl N 39   A/P:  1) After 1g Vanc given start Vancomycin 1250mg  IV q24 2) Start Zosyn 3.375g IV q8 (extended interval infusion)   Hessie Knows, PharmD, BCPS Pager [phone] 05/09/2012 8:49 PM

## 2012-05-10 ENCOUNTER — Encounter (HOSPITAL_COMMUNITY): Payer: Self-pay | Admitting: *Deleted

## 2012-05-10 LAB — URINALYSIS, ROUTINE W REFLEX MICROSCOPIC
Bilirubin Urine: NEGATIVE
Glucose, UA: NEGATIVE mg/dL
Ketones, ur: NEGATIVE mg/dL
Nitrite: NEGATIVE
Protein, ur: 100 mg/dL — AB
Specific Gravity, Urine: 1.022 (ref 1.005–1.030)
Urobilinogen, UA: 1 mg/dL (ref 0.0–1.0)
pH: 5.5 (ref 5.0–8.0)

## 2012-05-10 LAB — COMPREHENSIVE METABOLIC PANEL
ALT: 34 U/L (ref 0–53)
AST: 31 U/L (ref 0–37)
Albumin: 3 g/dL — ABNORMAL LOW (ref 3.5–5.2)
Alkaline Phosphatase: 62 U/L (ref 39–117)
BUN: 21 mg/dL (ref 6–23)
CO2: 22 mEq/L (ref 19–32)
Calcium: 8.7 mg/dL (ref 8.4–10.5)
Chloride: 100 mEq/L (ref 96–112)
Creatinine, Ser: 1.67 mg/dL — ABNORMAL HIGH (ref 0.50–1.35)
GFR calc Af Amer: 46 mL/min — ABNORMAL LOW (ref >90)
GFR calc non Af Amer: 40 mL/min — ABNORMAL LOW (ref >90)
Glucose, Bld: 109 mg/dL — ABNORMAL HIGH (ref 70–99)
Potassium: 3.4 mEq/L — ABNORMAL LOW (ref 3.5–5.1)
Sodium: 132 mEq/L — ABNORMAL LOW (ref 135–145)
Total Bilirubin: 0.5 mg/dL (ref 0.3–1.2)
Total Protein: 7.2 g/dL (ref 6.0–8.3)

## 2012-05-10 LAB — CBC
HCT: 33.8 % — ABNORMAL LOW (ref 39.0–52.0)
Hemoglobin: 11.3 g/dL — ABNORMAL LOW (ref 13.0–17.0)
MCH: 31.8 pg (ref 26.0–34.0)
MCHC: 33.4 g/dL (ref 30.0–36.0)
MCV: 95.2 fL (ref 78.0–100.0)
Platelets: 264 10*3/uL (ref 150–400)
RBC: 3.55 MIL/uL — ABNORMAL LOW (ref 4.22–5.81)
RDW: 12.8 % (ref 11.5–15.5)
WBC: 11.8 10*3/uL — ABNORMAL HIGH (ref 4.0–10.5)

## 2012-05-10 LAB — URINE MICROSCOPIC-ADD ON

## 2012-05-10 LAB — GLUCOSE, CAPILLARY
Glucose-Capillary: 108 mg/dL — ABNORMAL HIGH (ref 70–99)
Glucose-Capillary: 124 mg/dL — ABNORMAL HIGH (ref 70–99)
Glucose-Capillary: 103 mg/dL — ABNORMAL HIGH (ref 70–99)
Glucose-Capillary: 100 mg/dL — ABNORMAL HIGH (ref 70–99)

## 2012-05-10 MED ORDER — POTASSIUM CHLORIDE CRYS ER 20 MEQ PO TBCR
20.0000 meq | EXTENDED_RELEASE_TABLET | Freq: Once | ORAL | Status: AC
  Administered 2012-05-10: 20 meq via ORAL
  Filled 2012-05-10: qty 1

## 2012-05-10 NOTE — Consult Note (Addendum)
Urology Consult  Referring physician: Dr. Trula Ore Rama Reason for referral: Urinary tract infection, left hydronephrosis  Chief Complaint: Fever, decreased appetite  History of Present Illness: [name] is 72 is a 72 years old male who was found during workup of gross hematuria to have a left hydronephrosis. Cystoscopy showed trilobar prostatic hypertrophy. The ureteral orifices could not be identified. He had left percutaneous nephrostomy that was internalized a week ago. He was doing well until Sunday morning when he had a temperature of 104. He was taken to the  [facility] emergency room and was then transferred to [facility] for further evaluation. He also has been having decreased appetite since last Wednesday. And now every time he eats he has to go to the bathroom. He doesn't have diarrhea however it is loose bowel movement. He doesn't have any flank pain at this time. He has left flank discomfort on urination which is probably secondary to reflux through the stent. He voids well. He denies frequency, urgency, dysuria, hematuria. His temperature today is 101.9. He is on vancomycin and Zosyn.  Past Medical History  Diagnosis Date  . Hyperlipidemia   . Hypertension   . Hydronephrosis, left   . Gross hematuria   . BPH (benign prostatic hypertrophy)   . Elevated PSA   . Acid reflux OCCASIONALLY TAKE TUMS  . OA (osteoarthritis) knees   Past Surgical History  Procedure Laterality Date  . Hip surgery      Replacement  . Total hip arthroplasty  07-09-2005  DR ALUISIO    RIGHT HIP OA  . Left knee surgery  1993  (APPROX)  . Appendectomy  AGE 72  . Cataract extraction w/ intraocular lens implant  2013    RIGHT EYE  . Cystoscopy  02/13/2012    Procedure: CYSTOSCOPY FLEXIBLE;  Surgeon: Lindaann Slough, MD;  Location: [facility];  Service: Urology;  Laterality: N/A;    Medications: Norvasc, HydroDIURIL,Loveza,Livalo Allergies:  Allergies  Allergen Reactions  .  Lipitor (Atorvastatin)     Muscle ache    Family History  Problem Relation Age of Onset  . Hypertension Mother   . Cancer Father     prostate  . Hypertension Father   . Diabetes Father    Social History:  reports that he has never smoked. He has never used smokeless tobacco. He reports that he drinks about 1.8 ounces of alcohol per week. He reports that he does not use illicit drugs.  ROS: All systems are reviewed and negative except as noted.   Physical Exam:  Vital signs in last 24 hours: Temp:  [98.1 F (36.7 C)-101.9 F (38.8 C)] 101.9 F (38.8 C) (02/17 1515) Pulse Rate:  [75-88] 75 (02/17 1412) Resp:  [16-18] 18 (02/17 1412) BP: (135-160)/(65-74) 149/73 mmHg (02/17 1412) SpO2:  [98 %] 98 % (02/17 1412) Weight:  [207 lb 8 oz (94.121 kg)-207 lb 11.2 oz (94.212 kg)] 207 lb 11.2 oz (94.212 kg) (02/17 0517)  Cardiovascular: Skin warm; not flushed Respiratory: Breaths quiet; no shortness of breath Abdomen: No masses Neurological: Normal sensation to touch Musculoskeletal: Normal motor function arms and legs Lymphatics: No inguinal adenopathy Skin: No rashes Genitourinary:Penis and scrotal contents are within normal limits. Urine culture is pending. White count is down to 11.8 from 14.4 yesterday Laboratory Data:  Results for orders placed during the hospital encounter of 05/09/12 (from the past 72 hour(s))  PROCALCITONIN     Status: None   Collection Time    05/09/12  7:18 PM  Result Value Range   Procalcitonin 1.12     Comment:            Interpretation:     PCT > 0.5 ng/mL and <= 2 ng/mL:     Systemic infection (sepsis) is possible,     but other conditions are known to elevate     PCT as well.     (NOTE)             ICU PCT Algorithm               Non ICU PCT Algorithm        ----------------------------     ------------------------------             PCT < 0.25 ng/mL                 PCT < 0.1 ng/mL         Stopping of antibiotics            Stopping of  antibiotics           strongly encouraged.               strongly encouraged.        ----------------------------     ------------------------------           PCT level decrease by               PCT < 0.25 ng/mL           >= 80% from peak PCT           OR PCT 0.25 - 0.5 ng/mL          Stopping of antibiotics                                                 encouraged.         Stopping of antibiotics               encouraged.        ----------------------------     ------------------------------           PCT level decrease by              PCT >= 0.25 ng/mL           < 80% from peak PCT            AND PCT >= 0.5 ng/mL            Continuing antibiotics                                                  encouraged.           Continuing antibiotics                encouraged.        ----------------------------     ------------------------------         PCT level increase compared          PCT > 0.5 ng/mL             with peak PCT AND  PCT >= 0.5 ng/mL             Escalation of antibiotics                                              strongly encouraged.          Escalation of antibiotics            strongly encouraged.  COMPREHENSIVE METABOLIC PANEL     Status: Abnormal   Collection Time    05/09/12  8:00 PM      Result Value Range   Sodium 132 (*) 135 - 145 mEq/L   Potassium 3.3 (*) 3.5 - 5.1 mEq/L   Chloride 99  96 - 112 mEq/L   CO2 24  19 - 32 mEq/L   Glucose, Bld 117 (*) 70 - 99 mg/dL   BUN 23  6 - 23 mg/dL   Creatinine, Ser 1.61 (*) 0.50 - 1.35 mg/dL   Calcium 8.6  8.4 - 09.6 mg/dL   Total Protein 7.4  6.0 - 8.3 g/dL   Albumin 3.1 (*) 3.5 - 5.2 g/dL   AST 31  0 - 37 U/L   ALT 31  0 - 53 U/L   Alkaline Phosphatase 62  39 - 117 U/L   Total Bilirubin 0.7  0.3 - 1.2 mg/dL   GFR calc non Af Amer 37 (*) >90 mL/min   GFR calc Af Amer 43 (*) >90 mL/min   Comment:            The eGFR has been calculated     using the CKD EPI equation.     This calculation has not been      validated in all clinical     situations.     eGFR's persistently     <90 mL/min signify     possible Chronic Kidney Disease.  MAGNESIUM     Status: None   Collection Time    05/09/12  8:00 PM      Result Value Range   Magnesium 1.8  1.5 - 2.5 mg/dL  PHOSPHORUS     Status: None   Collection Time    05/09/12  8:00 PM      Result Value Range   Phosphorus 2.9  2.3 - 4.6 mg/dL  CBC WITH DIFFERENTIAL     Status: Abnormal   Collection Time    05/09/12  8:00 PM      Result Value Range   WBC 14.4 (*) 4.0 - 10.5 K/uL   RBC 3.65 (*) 4.22 - 5.81 MIL/uL   Hemoglobin 12.1 (*) 13.0 - 17.0 g/dL   HCT 04.5 (*) 40.9 - 81.1 %   MCV 95.1  78.0 - 100.0 fL   MCH 33.2  26.0 - 34.0 pg   MCHC 34.9  30.0 - 36.0 g/dL   RDW 91.4  78.2 - 95.6 %   Platelets 291  150 - 400 K/uL   Neutrophils Relative 80 (*) 43 - 77 %   Neutro Abs 11.5 (*) 1.7 - 7.7 K/uL   Lymphocytes Relative 6 (*) 12 - 46 %   Lymphs Abs 0.8  0.7 - 4.0 K/uL   Monocytes Relative 15 (*) 3 - 12 %   Monocytes Absolute 2.1 (*) 0.1 - 1.0 K/uL   Eosinophils Relative 0  0 - 5 %   Eosinophils Absolute  0.0  0.0 - 0.7 K/uL   Basophils Relative 0  0 - 1 %   Basophils Absolute 0.0  0.0 - 0.1 K/uL  APTT     Status: None   Collection Time    05/09/12  8:00 PM      Result Value Range   aPTT 34  24 - 37 seconds  PROTIME-INR     Status: None   Collection Time    05/09/12  8:00 PM      Result Value Range   Prothrombin Time 14.8  11.6 - 15.2 seconds   INR 1.18  0.00 - 1.49  LACTIC ACID, PLASMA     Status: None   Collection Time    05/09/12  8:00 PM      Result Value Range   Lactic Acid, Venous 1.0  0.5 - 2.2 mmol/L  COMPREHENSIVE METABOLIC PANEL     Status: Abnormal   Collection Time    05/10/12  5:09 AM      Result Value Range   Sodium 132 (*) 135 - 145 mEq/L   Potassium 3.4 (*) 3.5 - 5.1 mEq/L   Chloride 100  96 - 112 mEq/L   CO2 22  19 - 32 mEq/L   Glucose, Bld 109 (*) 70 - 99 mg/dL   BUN 21  6 - 23 mg/dL   Creatinine, Ser 1.61  (*) 0.50 - 1.35 mg/dL   Calcium 8.7  8.4 - 09.6 mg/dL   Total Protein 7.2  6.0 - 8.3 g/dL   Albumin 3.0 (*) 3.5 - 5.2 g/dL   AST 31  0 - 37 U/L   ALT 34  0 - 53 U/L   Alkaline Phosphatase 62  39 - 117 U/L   Total Bilirubin 0.5  0.3 - 1.2 mg/dL   GFR calc non Af Amer 40 (*) >90 mL/min   GFR calc Af Amer 46 (*) >90 mL/min   Comment:            The eGFR has been calculated     using the CKD EPI equation.     This calculation has not been     validated in all clinical     situations.     eGFR's persistently     <90 mL/min signify     possible Chronic Kidney Disease.  CBC     Status: Abnormal   Collection Time    05/10/12  5:09 AM      Result Value Range   WBC 11.8 (*) 4.0 - 10.5 K/uL   RBC 3.55 (*) 4.22 - 5.81 MIL/uL   Hemoglobin 11.3 (*) 13.0 - 17.0 g/dL   HCT 04.5 (*) 40.9 - 81.1 %   MCV 95.2  78.0 - 100.0 fL   MCH 31.8  26.0 - 34.0 pg   MCHC 33.4  30.0 - 36.0 g/dL   RDW 91.4  78.2 - 95.6 %   Platelets 264  150 - 400 K/uL  GLUCOSE, CAPILLARY     Status: Abnormal   Collection Time    05/10/12  7:52 AM      Result Value Range   Glucose-Capillary 108 (*) 70 - 99 mg/dL  URINALYSIS, ROUTINE W REFLEX MICROSCOPIC     Status: Abnormal   Collection Time    05/10/12 10:55 AM      Result Value Range   Color, Urine YELLOW  YELLOW   APPearance CLOUDY (*) CLEAR   Specific Gravity, Urine 1.022  1.005 - 1.030  pH 5.5  5.0 - 8.0   Glucose, UA NEGATIVE  NEGATIVE mg/dL   Hgb urine dipstick MODERATE (*) NEGATIVE   Bilirubin Urine NEGATIVE  NEGATIVE   Ketones, ur NEGATIVE  NEGATIVE mg/dL   Protein, ur 829 (*) NEGATIVE mg/dL   Urobilinogen, UA 1.0  0.0 - 1.0 mg/dL   Nitrite NEGATIVE  NEGATIVE   Leukocytes, UA MODERATE (*) NEGATIVE  URINE MICROSCOPIC-ADD ON     Status: Abnormal   Collection Time    05/10/12 10:55 AM      Result Value Range   Squamous Epithelial / LPF RARE  RARE   WBC, UA 11-20  <3 WBC/hpf   RBC / HPF 3-6  <3 RBC/hpf   Bacteria, UA FEW (*) RARE  GLUCOSE, CAPILLARY      Status: Abnormal   Collection Time    05/10/12 12:01 PM      Result Value Range   Glucose-Capillary 124 (*) 70 - 99 mg/dL   Comment 1 Incorrect date on first run (03/25/2011)    GLUCOSE, CAPILLARY     Status: Abnormal   Collection Time    05/10/12  1:35 PM      Result Value Range   Glucose-Capillary 103 (*) 70 - 99 mg/dL  GLUCOSE, CAPILLARY     Status: Abnormal   Collection Time    05/10/12  4:52 PM      Result Value Range   Glucose-Capillary 100 (*) 70 - 99 mg/dL   No results found for this or any previous visit (from the past 240 hour(s)). Creatinine:  Recent Labs  05/05/12 1145 05/09/12 2000 05/10/12 0509  CREATININE 1.41* 1.76* 1.67*    Xrays:   Impression/Assessment:  Left hydronephrosis. UTI.  Plan:  Continue IV antibiotics.  Mikie Misner-HENRY 05/10/2012, 7:22 PM    CC: Dr. Trula Ore Rama

## 2012-05-10 NOTE — Evaluation (Signed)
Physical Therapy Evaluation Patient Details Name: Albert Hicks MRN: [id number] DOB: 03/16/1941 Today's Date: 05/10/2012 Time: 4098-1191 PT Time Calculation (min): 19 min  PT Assessment / Plan / Recommendation Clinical Impression  72 yo male admitted with pyelonephritis, L flank pain. Pt Independent prior to admission. On eval, pt requires Min-guard assist for mobility. Gait unsteady at times. Recommend daily mobility with nursing in hallway during stay. Will plan to follow. Do not anticipate any follow up PT needs at discharge.     PT Assessment  Patient needs continued PT services    Follow Up Recommendations  No PT follow up    Does the patient have the potential to tolerate intense rehabilitation      Barriers to Discharge        Equipment Recommendations  None recommended by PT    Recommendations for Other Services     Frequency Min 3X/week    Precautions / Restrictions Precautions Precautions: Fall Restrictions Weight Bearing Restrictions: No   Pertinent Vitals/Pain L flank pain 2/10       Mobility  Bed Mobility Bed Mobility: Supine to Sit Supine to Sit: HOB flat; Min-guard assist Details for Bed Mobility Assistance: Pt uses momentum to get up into full upright  Transfers Transfers: Sit to Stand;Stand to Sit Sit to Stand: 4: Min guard;From bed;From toilet Stand to Sit: 4: Min guard;To toilet;To bed Details for Transfer Assistance: Increased time to rise, especially from lower surfaces,  and uncontrolled descent noted.  Ambulation/Gait Ambulation/Gait Assistance: 4: Min guard Ambulation Distance (Feet): 150 Feet Assistive device: None Ambulation/Gait Assistance Details: Pt incontinent of urine while ambulating to bathroom from bed. Pt intermittently reaching out for handrail to hold onto in hallway. Gait mildly unsteady. Gait Pattern: Step-through pattern;Decreased stride length    Exercises     PT Diagnosis: Difficulty walking;Abnormality of gait  PT  Problem List: Decreased mobility;Decreased balance;Decreased activity tolerance PT Treatment Interventions: Gait training;Stair training;Functional mobility training;Therapeutic activities;Therapeutic exercise;Balance training   PT Goals Acute Rehab PT Goals PT Goal Formulation: With patient Potential to Achieve Goals: Good Pt will go Sit to Stand: with modified independence PT Goal: Sit to Stand - Progress: Goal set today Pt will Ambulate: >150 feet;with modified independence PT Goal: Ambulate - Progress: Goal set today Pt will Go Up / Down Stairs: with modified independence;6-9 stairs;with rail(s) PT Goal: Up/Down Stairs - Progress: Goal set today  Visit Information  Last PT Received On: 05/10/12 Assistance Needed: +1    Subjective Data  Subjective: "Im lazy...how far are we going?" Patient Stated Goal: home   Prior Functioning  Home Living Lives With: Significant other Type of Home: House Home Access: Stairs to enter Entrance Stairs-Number of Steps: 3 Entrance Stairs-Rails: None Home Layout: Two level;Bed/bath upstairs Home Adaptive Equipment: None Prior Function Level of Independence: Independent Driving: Yes Communication Communication: No difficulties    Cognition  Cognition Overall Cognitive Status: Appears within functional limits for tasks assessed/performed Arousal/Alertness: Awake/alert Orientation Level: Appears intact for tasks assessed Behavior During Session: [facility] for tasks performed    Extremity/Trunk Assessment Right Lower Extremity Assessment RLE ROM/Strength/Tone: Deficits RLE ROM/Strength/Tone Deficits: Strength at least 4/5 with functional activity Left Lower Extremity Assessment LLE ROM/Strength/Tone: Deficits LLE ROM/Strength/Tone Deficits: Strength at least 4/5 with functional activity   Balance    End of Session PT - End of Session Activity Tolerance: Patient limited by fatigue Patient left: in chair;with call bell/phone within reach  GP      Rebeca Alert [facility] 05/10/2012, 9:20 AM [phone]

## 2012-05-10 NOTE — Care Management Note (Signed)
    Page 1 of 1   05/13/2012     3:13:59 PM   CARE MANAGEMENT NOTE 05/13/2012  Patient:  BEAU, RAMSBURG   Account Number:  [id number]  Date Initiated:  05/10/2012  Documentation initiated by:  Lanier Clam  Subjective/Objective Assessment:   ADMITTED W/FEVER, L FLANK PAIN.RECENT ADMISSION ON 2/12-L HYDRONEPHROSIS-L URETERAL STENT.     Action/Plan:   FROM HOME W/SPOUSE.HAS PCP,PHARMACY.   Anticipated DC Date:  05/13/2012   Anticipated DC Plan:  HOME/SELF CARE      DC Planning Services  CM consult      Choice offered to / List presented to:             Status of service:  Completed, signed off Medicare Important Message given?   (If response is "NO", the following Medicare IM given date fields will be blank) Date Medicare IM given:   Date Additional Medicare IM given:    Discharge Disposition:  HOME/SELF CARE  Per UR Regulation:  Reviewed for med. necessity/level of care/duration of stay  If discussed at Long Length of Stay Meetings, dates discussed:    Comments:  05/13/12 Treshawn Allen RN,BSN NCM [phone] D/C HOME.NO ORDERS OR D/C NEEDS.  05/10/12 Emmanuela Ghazi RN,BSN NCM [phone]

## 2012-05-10 NOTE — Progress Notes (Signed)
[facility] PROGRESS NOTE  Thien Berka ZOX:[id number] DOB: January 12, 1941 DOA: 05/09/2012 PCP: Tandy Gaw, PA-C  Brief narrative: Pritesh Sobecki is an 72 y.o. male past medical history of hypertension, dyslipidemia, benign prostatic hypertrophy, recent left hydronephrosis status post left percutaneous nephrostomy on 04/20/2012 followed by exchange of the left percutaneous nephrostomy tube to a double JJ stent on 05/05/2012 who presented to an outside facility with fever and left flank pain. He subsequently was transferred to [facility] for further evaluation. Urinalysis showed cloudy urine with moderate leukocytes but no nitrites. He has been placed on empiric vancomycin and Zosyn pending culture studies.  Assessment/Plan: Principal Problem:   Sepsis due to a urinary tract infection/ Pyelonephritis / obstruction of kidney / leukocytosis -Complicated in the setting of recent instrumentation and indwelling JJ stent. -Urology consultation requested. -Continue empiric Vancomycin and Zosyn. -Lactic acid level 1.0 and procalcitonin level 1.2. Active Problems:   Hyperlipidemia -Continue atorvastatin.   Hypertension -Continue Norvasc. Hold hydrochlorothiazide. This is not a good medication to use in patients with chronic kidney disease and, if his blood pressures not controlled with Norvasc, we'll need to consider an alternative agent.   CKD (chronic kidney disease) stage 3, GFR 30-59 ml/min -Monitor creatinine. Continue IV fluids.   Hypokalemia -Given a 20 mEq dose of potassium today.   Hyponatremia -Mild. Monitor.  Code Status: Full. Family Communication: Wife updated at bedside. Disposition Plan: Home when stable.   Medical Consultants:  Urology  Other Consultants:  None.  Anti-infectives:  Vancomycin 05/09/2012--->  Zosyn 05/09/2012--->  HPI/Subjective: Ayron Fillinger is feeling a bit better. He was febrile overnight with complaints of flank pain. Appetite good. No  other complaints.  Objective: Filed Vitals:   05/10/12 0931 05/10/12 1412 05/10/12 1433 05/10/12 1515  BP: 138/68 149/73    Pulse:  75    Temp:  98.1 F (36.7 C) 99.3 F (37.4 C) 101.9 F (38.8 C)  TempSrc:  Oral Oral Oral  Resp:  18    Height:      Weight:      SpO2:  98%      Intake/Output Summary (Last 24 hours) at 05/10/12 1631 Last data filed at 05/10/12 1555  Gross per 24 hour  Intake   1320 ml  Output    500 ml  Net    820 ml    Exam: Gen:  NAD Cardiovascular:  RRR, No M/R/G Respiratory:  Lungs CTAB Gastrointestinal:  Abdomen soft, NT/ND, + BS Extremities:  No C/E/C  Data Reviewed: Basic Metabolic Panel:  Recent Labs Lab 05/05/12 1145 05/09/12 2000 05/10/12 0509  NA 139 132* 132*  K 3.6 3.3* 3.4*  CL 102 99 100  CO2 26 24 22   GLUCOSE 111* 117* 109*  BUN 17 23 21   CREATININE 1.41* 1.76* 1.67*  CALCIUM 9.3 8.6 8.7  MG  --  1.8  --   PHOS  --  2.9  --    GFR Estimated Creatinine Clearance: 45.9 ml/min (by C-G formula based on Cr of 1.67). Liver Function Tests:  Recent Labs Lab 05/09/12 2000 05/10/12 0509  AST 31 31  ALT 31 34  ALKPHOS 62 62  BILITOT 0.7 0.5  PROT 7.4 7.2  ALBUMIN 3.1* 3.0*   Coagulation profile  Recent Labs Lab 05/05/12 1145 05/09/12 2000  INR 1.01 1.18    CBC:  Recent Labs Lab 05/05/12 1145 05/09/12 2000 05/10/12 0509  WBC 5.8 14.4* 11.8*  NEUTROABS  --  11.5*  --   HGB 12.9* 12.1*  11.3*  HCT 38.3* 34.7* 33.8*  MCV 96.7 95.1 95.2  PLT 404* 291 264   CBG:  Recent Labs Lab 05/10/12 0752 05/10/12 1201 05/10/12 1335  GLUCAP 108* 124* 103*   Urinalysis    Component Value Date/Time   COLORURINE YELLOW 05/10/2012 1055   APPEARANCEUR CLOUDY* 05/10/2012 1055   LABSPEC 1.022 05/10/2012 1055   PHURINE 5.5 05/10/2012 1055   GLUCOSEU NEGATIVE 05/10/2012 1055   HGBUR MODERATE* 05/10/2012 1055   BILIRUBINUR NEGATIVE 05/10/2012 1055   KETONESUR NEGATIVE 05/10/2012 1055   PROTEINUR 100* 05/10/2012 1055    UROBILINOGEN 1.0 05/10/2012 1055   NITRITE NEGATIVE 05/10/2012 1055   LEUKOCYTESUR MODERATE* 05/10/2012 1055   Microbiology No results found for this or any previous visit (from the past 240 hour(s)).   Procedures and Diagnostic Studies: Ir Nephrostogram Left  05/05/2012  *RADIOLOGY REPORT*  Clinical Data: Left sided hydronephrosis  1.  LEFT SIDED NEPHROSTOGRAM. 2.  LEFT SIDED URETERAL STENT PLACEMENT  Comparison:  Left-sided percutaneous nephrostomy catheter placement - 02/18/2013  Intravenous medications:  Versed 4 mg IV; Fentanyl 200 mcg IV; Ciprofloxacin 400 mg IV the antibiotics were administered within 1 hour of the procedure  Total Moderate Sedation Time: 25 minutes.  Contrast:  50 ml Omnipaque-300; administered into the urinary system  Fluoroscopy Time: 8.5 minutes.  Complications:  None immediate  Procedure:  The procedure, risks, benefits, and alternatives were explained to the patient.  Questions regarding the procedure were encouraged and answered.  The patient understands and consents to the procedure.  The nephrostomy tubes and surrounding skin were prepped with Betadine in a sterile fashion, and a sterile drape was applied covering the operative field.  A sterile gown and sterile gloves were used for the procedure. Local anesthesia was provided with 1% Lidocaine.  The preexisting left-sided nephrostomy tube was injected with contrast material.  Fluoroscopic spot images were obtained of the collecting system and ureter to the level of the urinary bladder. The existing nephrostomy catheter was cut and cannulated with a Benson wire.  Under intermittent fluoroscopic guidance, the existing nephrostomy catheter was exchanged for a 9-French vascular sheath which was advanced into the renal pelvis and utilized to advance an additional Benson wire into the renal collecting system to be used as a Museum/gallery conservator.  The vascular sheath was exchanged for a Kumpe catheter which was manipulated to the level of  the urinary bladder with the use of a stiff Glidewire.  The Kumpe catheter was utilized to estimate to appropriate length of the ureteral stent.  An 8-French, 28 cm ureteral stent was then advanced over an Amplatz superstiff guidewire.  The distal portion was formed at the level of the bladder.  Proximal portion was then formed at the level of the renal collecting system.  Contrast injection was performed demonstrating brisk passage of contrast to the double-J ureteral stent and into the urinary bladder. As such, the percutaneous access site was abandoned. A dressing was placed.  The patient tolerated procedure well without immediate postprocedural complication.  Findings:  Nephrostogram demonstrates appropriate positioning of the existing left-sided percutaneous nephrostomy catheter with resolution of previously noted marked hydronephrosis. There are no discrete irregular filling defects within the left ureter.  A left-sided ureteral stent was able to be placed spanning from the left renal collecting system to the bladder. Contrast injection confirmed appropriate positioning of functioning of the ureteral stent as such, percutaneous access was abandoned.  IMPRESSION:  Successful conversion of existing left sided nephrostomy catheter to a 28  cm ureteral stent.   Original Report Authenticated By: Tacey Ruiz, MD    Ir Perc Nephrostomy Left  04/20/2012  *RADIOLOGY REPORT*  Indication: History of enlarged prostate, now with left sided hydronephrosis, post failed attempted retrograde ureteral stent placement  1.  ULTRASOUND GUIDANCE FOR PUNCTURE OF THE LEFT RENAL COLLECTING SYSTEM. 2.  LEFT PERCUTANEOUS NEPHROSTOMY TUBE PLACEMENT  Comparison: Renal ultrasound - 04/15/2012; CT abdomen pelvis - 02/07/2012 [facility] examination  Sedation: Fentanyl 150 mcg IV; Versed 4 mg IV; Ciprofloxacin 400 mg IV;  The antibiotic was administered in an appropriate time frame prior to skin puncture.  Contrast: 25  Omnipaque-300 in addition into the collecting system  Total Moderate Sedation Time: 40 minutes.  Fluoroscopy Time: 8.5 minutes.  Complications: None immediate  Procedure:  The procedure, risks, benefits, and alternatives were explained to the patient.  Questions regarding the procedure were encouraged and answered.  The patient understands and consents to the procedure. A timeout was performed prior to the initiation of the procedure.  The left flank region was prepped with Betadine in a sterile fashion, and a sterile drape was applied covering the operative field.  A sterile gown and sterile gloves were used for the procedure. Local anesthesia was provided with 1% Lidocaine with epinephrine.  Ultrasound was used to localize the  kidney.  Under direct ultrasound guidance, a 21 gauge needle was advanced into the renal collecting system.  An ultrasound image documentation was performed.  Access within the collecting system was confirmed with the efflux of urine followed by contrast injection.  Over a Nitrex wire, the tract was dilated with an Accustick stent. Over a Benson wire, the track was dilated and a 10 French nephrostomy catheter was attempted to be advanced into the collecting system, however as there was some resistance, the nephrostomy was exchanged for a Kumpe catheter.  Now over an Amplatz stiff guide wire, a 10 French nephrostomy catheter was advanced into the left renal pelvis, coiled and locked.  Contrast was injected and several sport radiographs were obtained in various obliquities confirming access.  The catheter was secured at the skin with a Prolene retention suture and a gravity bag was placed.  A dressing was placed.  The patient tolerated procedure well without immediate postprocedural complication.  Findings:  Ultrasound scanning demonstrates a severely dilated left-sided renal collecting system, no outside abdominal CT.  Under ultrasound guidance, a posterior inferior calix was targeted  ultimately allowing placement of an 10-French percutaneous nephrostomy catheter under intermittent fluoroscopic guidance. Contrast injection confirmed appropriate positioning.  A small amount of postprocedural blood was noted within the collecting system. Incidental note is made of partial duplication of the left renal collecting system.  IMPRESSION:  Successful ultrasound and fluoroscopic guided placement of a left sided 10 French PCN.  Plan:  The patient may return at a later date for formal antegrade nephrostogram and potential internalization of the nephrostomy catheter.   Original Report Authenticated By: Tacey Ruiz, MD    Ir US Guide Bx Asp/drain  04/20/2012  *RADIOLOGY REPORT*  Indication: History of enlarged prostate, now with left sided hydronephrosis, post failed attempted retrograde ureteral stent placement  1.  ULTRASOUND GUIDANCE FOR PUNCTURE OF THE LEFT RENAL COLLECTING SYSTEM. 2.  LEFT PERCUTANEOUS NEPHROSTOMY TUBE PLACEMENT  Comparison: Renal ultrasound - 04/15/2012; CT abdomen pelvis - 02/07/2012 [facility] examination  Sedation: Fentanyl 150 mcg IV; Versed 4 mg IV; Ciprofloxacin 400 mg IV;  The antibiotic was administered in an appropriate time  frame prior to skin puncture.  Contrast: 25 Omnipaque-300 in addition into the collecting system  Total Moderate Sedation Time: 40 minutes.  Fluoroscopy Time: 8.5 minutes.  Complications: None immediate  Procedure:  The procedure, risks, benefits, and alternatives were explained to the patient.  Questions regarding the procedure were encouraged and answered.  The patient understands and consents to the procedure. A timeout was performed prior to the initiation of the procedure.  The left flank region was prepped with Betadine in a sterile fashion, and a sterile drape was applied covering the operative field.  A sterile gown and sterile gloves were used for the procedure. Local anesthesia was provided with 1% Lidocaine with epinephrine.   Ultrasound was used to localize the  kidney.  Under direct ultrasound guidance, a 21 gauge needle was advanced into the renal collecting system.  An ultrasound image documentation was performed.  Access within the collecting system was confirmed with the efflux of urine followed by contrast injection.  Over a Nitrex wire, the tract was dilated with an Accustick stent. Over a Benson wire, the track was dilated and a 10 French nephrostomy catheter was attempted to be advanced into the collecting system, however as there was some resistance, the nephrostomy was exchanged for a Kumpe catheter.  Now over an Amplatz stiff guide wire, a 10 French nephrostomy catheter was advanced into the left renal pelvis, coiled and locked.  Contrast was injected and several sport radiographs were obtained in various obliquities confirming access.  The catheter was secured at the skin with a Prolene retention suture and a gravity bag was placed.  A dressing was placed.  The patient tolerated procedure well without immediate postprocedural complication.  Findings:  Ultrasound scanning demonstrates a severely dilated left-sided renal collecting system, no outside abdominal CT.  Under ultrasound guidance, a posterior inferior calix was targeted ultimately allowing placement of an 10-French percutaneous nephrostomy catheter under intermittent fluoroscopic guidance. Contrast injection confirmed appropriate positioning.  A small amount of postprocedural blood was noted within the collecting system. Incidental note is made of partial duplication of the left renal collecting system.  IMPRESSION:  Successful ultrasound and fluoroscopic guided placement of a left sided 10 French PCN.  Plan:  The patient may return at a later date for formal antegrade nephrostogram and potential internalization of the nephrostomy catheter.   Original Report Authenticated By: Tacey Ruiz, MD    Dg Chest Port 1 View  05/09/2012  *RADIOLOGY REPORT*  Clinical  Data: Fever, weakness  PORTABLE CHEST - 1 VIEW  Comparison: Prior chest x-ray 01/22/2011  Findings: Very low inspiratory volumes with patchy bibasilar opacities.  Low inspiratory volumes exaggerates the cardial pericardial silhouette result in crowding of the pulmonary vasculature.  The heart size is likely within normal limits.  No pleural effusion or pneumothorax.  No acute osseous abnormality.  IMPRESSION: Very low inspiratory volumes with bibasilar opacities which may reflect atelectasis or early infiltrate.  Dedicated PA and lateral chest x-ray may be helpful if clinically warranted.   Original Report Authenticated By: Malachy Moan, M.D.    Ir [place] Abts Cath Perc Left  05/05/2012  *RADIOLOGY REPORT*  Clinical Data: Left sided hydronephrosis  1.  LEFT SIDED NEPHROSTOGRAM. 2.  LEFT SIDED URETERAL STENT PLACEMENT  Comparison:  Left-sided percutaneous nephrostomy catheter placement - 02/18/2013  Intravenous medications:  Versed 4 mg IV; Fentanyl 200 mcg IV; Ciprofloxacin 400 mg IV the antibiotics were administered within 1 hour of the procedure  Total Moderate Sedation Time: 25 minutes.  Contrast:  50 ml Omnipaque-300; administered into the urinary system  Fluoroscopy Time: 8.5 minutes.  Complications:  None immediate  Procedure:  The procedure, risks, benefits, and alternatives were explained to the patient.  Questions regarding the procedure were encouraged and answered.  The patient understands and consents to the procedure.  The nephrostomy tubes and surrounding skin were prepped with Betadine in a sterile fashion, and a sterile drape was applied covering the operative field.  A sterile gown and sterile gloves were used for the procedure. Local anesthesia was provided with 1% Lidocaine.  The preexisting left-sided nephrostomy tube was injected with contrast material.  Fluoroscopic spot images were obtained of the collecting system and ureter to the level of the urinary bladder. The existing nephrostomy  catheter was cut and cannulated with a Benson wire.  Under intermittent fluoroscopic guidance, the existing nephrostomy catheter was exchanged for a 9-French vascular sheath which was advanced into the renal pelvis and utilized to advance an additional Benson wire into the renal collecting system to be used as a Museum/gallery conservator.  The vascular sheath was exchanged for a Kumpe catheter which was manipulated to the level of the urinary bladder with the use of a stiff Glidewire.  The Kumpe catheter was utilized to estimate to appropriate length of the ureteral stent.  An 8-French, 28 cm ureteral stent was then advanced over an Amplatz superstiff guidewire.  The distal portion was formed at the level of the bladder.  Proximal portion was then formed at the level of the renal collecting system.  Contrast injection was performed demonstrating brisk passage of contrast to the double-J ureteral stent and into the urinary bladder. As such, the percutaneous access site was abandoned. A dressing was placed.  The patient tolerated procedure well without immediate postprocedural complication.  Findings:  Nephrostogram demonstrates appropriate positioning of the existing left-sided percutaneous nephrostomy catheter with resolution of previously noted marked hydronephrosis. There are no discrete irregular filling defects within the left ureter.  A left-sided ureteral stent was able to be placed spanning from the left renal collecting system to the bladder. Contrast injection confirmed appropriate positioning of functioning of the ureteral stent as such, percutaneous access was abandoned.  IMPRESSION:  Successful conversion of existing left sided nephrostomy catheter to a 28 cm ureteral stent.   Original Report Authenticated By: Tacey Ruiz, MD     Scheduled Meds: . amLODipine  5 mg Oral q morning - 10a  . hydrochlorothiazide  25 mg Oral q morning - 10a  . omega-3 acid ethyl esters  2 g Oral Daily  . piperacillin-tazobactam  (ZOSYN)  IV  3.375 g Intravenous Q8H  . simvastatin  20 mg Oral q1800  . vancomycin  1,250 mg Intravenous Q24H   Continuous Infusions: . sodium chloride 75 mL/hr at 05/10/12 0832    Time spent: 35 minutes.   LOS: 1 day   RAMA,CHRISTINA  [facility] Pager [phone].  If 8PM-8AM, please contact night-coverage at [email or web address], password [facility] 05/10/2012, 4:31 PM

## 2012-05-11 DIAGNOSIS — R197 Diarrhea, unspecified: Secondary | ICD-10-CM | POA: Diagnosis present

## 2012-05-11 LAB — CLOSTRIDIUM DIFFICILE BY PCR: Toxigenic C. Difficile by PCR: NEGATIVE

## 2012-05-11 LAB — BASIC METABOLIC PANEL
BUN: 18 mg/dL (ref 6–23)
CO2: 22 mEq/L (ref 19–32)
Calcium: 9 mg/dL (ref 8.4–10.5)
Chloride: 99 mEq/L (ref 96–112)
Creatinine, Ser: 1.71 mg/dL — ABNORMAL HIGH (ref 0.50–1.35)
GFR calc Af Amer: 45 mL/min — ABNORMAL LOW (ref >90)
GFR calc non Af Amer: 38 mL/min — ABNORMAL LOW (ref >90)
Glucose, Bld: 104 mg/dL — ABNORMAL HIGH (ref 70–99)
Potassium: 3.4 mEq/L — ABNORMAL LOW (ref 3.5–5.1)
Sodium: 135 mEq/L (ref 135–145)

## 2012-05-11 LAB — URINE CULTURE
Colony Count: NO GROWTH
Culture: NO GROWTH

## 2012-05-11 LAB — CBC
HCT: 37.1 % — ABNORMAL LOW (ref 39.0–52.0)
Hemoglobin: 12.6 g/dL — ABNORMAL LOW (ref 13.0–17.0)
MCH: 32.7 pg (ref 26.0–34.0)
MCHC: 34 g/dL (ref 30.0–36.0)
MCV: 96.4 fL (ref 78.0–100.0)
Platelets: 319 10*3/uL (ref 150–400)
RBC: 3.85 MIL/uL — ABNORMAL LOW (ref 4.22–5.81)
RDW: 13.1 % (ref 11.5–15.5)
WBC: 11 10*3/uL — ABNORMAL HIGH (ref 4.0–10.5)

## 2012-05-11 MED ORDER — POTASSIUM CHLORIDE CRYS ER 20 MEQ PO TBCR
20.0000 meq | EXTENDED_RELEASE_TABLET | Freq: Two times a day (BID) | ORAL | Status: DC
  Administered 2012-05-11 – 2012-05-13 (×5): 20 meq via ORAL
  Filled 2012-05-11 (×6): qty 1

## 2012-05-11 MED ORDER — ENSURE COMPLETE PO LIQD
237.0000 mL | Freq: Three times a day (TID) | ORAL | Status: DC
  Administered 2012-05-11 – 2012-05-13 (×3): 237 mL via ORAL

## 2012-05-11 NOTE — Progress Notes (Signed)
[facility] PROGRESS NOTE  Albert Hicks NWG:[id number] DOB: [birth year] DOA: 05/09/2012 PCP: Tandy Gaw, PA-C  Brief narrative: Albert Hicks is an 72 y.o. male with a past medical history of hypertension, dyslipidemia, benign prostatic hypertrophy, recent left hydronephrosis status post left percutaneous nephrostomy on 04/20/2012 followed by exchange of the left percutaneous nephrostomy tube to a double JJ stent on 05/05/2012 who presented to an outside facility with fever and left flank pain. He subsequently was transferred to [facility] for further evaluation. Urinalysis showed cloudy urine with moderate leukocytes but no nitrites. He has been placed on empiric vancomycin and Zosyn pending culture studies.   Assessment/Plan: Principal Problem:  Sepsis due to a urinary tract infection/ Pyelonephritis / obstruction of kidney / leukocytosis  -Complicated in the setting of recent instrumentation and indwelling JJ stent.  -Seen by Dr. Brunilda Payor of Urology with recommendations to continue antibiotics.  -Continue empiric Vancomycin and Zosyn. Followup urine cultures and narrow antibiotics once culture data available.  F/U BCUX. -Lactic acid level 1.0 and procalcitonin level 1.2 on admission.  Active Problems: Diarrhea -Check stools for C. Diff PCR, no history of prior antibiotic use.  Hyperlipidemia  -Continue atorvastatin.  Hypertension  -Continue Norvasc. Hold hydrochlorothiazide. This is not a good medication to use in patients with chronic kidney disease and, if his blood pressures not controlled with Norvasc, we'll need to consider an alternative agent.  CKD (chronic kidney disease) stage 3, GFR 30-59 ml/min  -Monitor creatinine. Continue IV fluids.  Hypokalemia  -Place on routine potassium supplementation.  Hyponatremia  -Mild. Monitor.  Code Status: Full.  Family Communication: Wife updated at bedside 05/10/12, no one at bedside today.  Disposition Plan: Home when stable.    Medical Consultants:  Dr. Su Grand, Urology Other Consultants:  None. Anti-infectives:  Vancomycin 05/09/2012--->  Zosyn 05/09/2012--->   HPI/Subjective: Albert Hicks continues to feel unwell.  Still spiking fevers.  No appetite.  No complaints of abdominal or flank pain.  Had frequent loose stools last night, but only one this morning.  Objective: Filed Vitals:   05/10/12 2214 05/11/12 0135 05/11/12 0624 05/11/12 0713  BP: 165/77   137/61  Pulse: 78   83  Temp: 102.5 F (39.2 C) 98.7 F (37.1 C)  101.7 F (38.7 C)  TempSrc: Oral Oral  Oral  Resp: 18   18  Height:      Weight:   93.9 kg (207 lb 0.2 oz)   SpO2: 99%   98%    Intake/Output Summary (Last 24 hours) at 05/11/12 1021 Last data filed at 05/11/12 0700  Gross per 24 hour  Intake   2680 ml  Output    300 ml  Net   2380 ml    Exam: Gen:  NAD Cardiovascular:  RRR, No M/R/G Respiratory:  Lungs CTAB Gastrointestinal:  Abdomen soft, NT/ND, + BS Extremities:  No C/E/C  Data Reviewed: Basic Metabolic Panel:  Recent Labs Lab 05/05/12 1145 05/09/12 2000 05/10/12 0509 05/11/12 0446  NA 139 132* 132* 135  K 3.6 3.3* 3.4* 3.4*  CL 102 99 100 99  CO2 26 24 22 22   GLUCOSE 111* 117* 109* 104*  BUN 17 23 21 18   CREATININE 1.41* 1.76* 1.67* 1.71*  CALCIUM 9.3 8.6 8.7 9.0  MG  --  1.8  --   --   PHOS  --  2.9  --   --    GFR Estimated Creatinine Clearance: 44.8 ml/min (by C-G formula based on Cr of 1.71). Liver Function  Tests:  Recent Labs Lab 05/09/12 2000 05/10/12 0509  AST 31 31  ALT 31 34  ALKPHOS 62 62  BILITOT 0.7 0.5  PROT 7.4 7.2  ALBUMIN 3.1* 3.0*   Coagulation profile  Recent Labs Lab 05/05/12 1145 05/09/12 2000  INR 1.01 1.18    CBC:  Recent Labs Lab 05/05/12 1145 05/09/12 2000 05/10/12 0509 05/11/12 0446  WBC 5.8 14.4* 11.8* 11.0*  NEUTROABS  --  11.5*  --   --   HGB 12.9* 12.1* 11.3* 12.6*  HCT 38.3* 34.7* 33.8* 37.1*  MCV 96.7 95.1 95.2 96.4  PLT 404* 291 264 319    CBG:  Recent Labs Lab 05/10/12 0752 05/10/12 1201 05/10/12 1335 05/10/12 1652  GLUCAP 108* 124* 103* 100*   Microbiology Recent Results (from the past 240 hour(s))  CULTURE, BLOOD (ROUTINE X 2)     Status: None   Collection Time    05/10/12  5:05 AM      Result Value Range Status   Specimen Description BLOOD RIGHT ANTECUBITAL   Final   Special Requests BOTTLES DRAWN AEROBIC AND ANAEROBIC   Final   Culture  Setup Time 05/10/2012 08:26   Final   Culture     Final   Value:        BLOOD CULTURE RECEIVED NO GROWTH TO DATE CULTURE WILL BE HELD FOR 5 DAYS BEFORE ISSUING A FINAL NEGATIVE REPORT   Report Status PENDING   Incomplete  CULTURE, BLOOD (ROUTINE X 2)     Status: None   Collection Time    05/10/12  5:09 AM      Result Value Range Status   Specimen Description BLOOD RIGHT HAND   Final   Special Requests BOTTLES DRAWN AEROBIC AND ANAEROBIC   Final   Culture  Setup Time 05/10/2012 08:26   Final   Culture     Final   Value:        BLOOD CULTURE RECEIVED NO GROWTH TO DATE CULTURE WILL BE HELD FOR 5 DAYS BEFORE ISSUING A FINAL NEGATIVE REPORT   Report Status PENDING   Incomplete     Procedures and Diagnostic Studies: Ir Nephrostogram Left  05/05/2012  *RADIOLOGY REPORT*  Clinical Data: Left sided hydronephrosis  1.  LEFT SIDED NEPHROSTOGRAM. 2.  LEFT SIDED URETERAL STENT PLACEMENT  Comparison:  Left-sided percutaneous nephrostomy catheter placement - 02/18/2013  Intravenous medications:  Versed 4 mg IV; Fentanyl 200 mcg IV; Ciprofloxacin 400 mg IV the antibiotics were administered within 1 hour of the procedure  Total Moderate Sedation Time: 25 minutes.  Contrast:  50 ml Omnipaque-300; administered into the urinary system  Fluoroscopy Time: 8.5 minutes.  Complications:  None immediate  Procedure:  The procedure, risks, benefits, and alternatives were explained to the patient.  Questions regarding the procedure were encouraged and answered.  The patient understands and  consents to the procedure.  The nephrostomy tubes and surrounding skin were prepped with Betadine in a sterile fashion, and a sterile drape was applied covering the operative field.  A sterile gown and sterile gloves were used for the procedure. Local anesthesia was provided with 1% Lidocaine.  The preexisting left-sided nephrostomy tube was injected with contrast material.  Fluoroscopic spot images were obtained of the collecting system and ureter to the level of the urinary bladder. The existing nephrostomy catheter was cut and cannulated with a Benson wire.  Under intermittent fluoroscopic guidance, the existing nephrostomy catheter was exchanged for a 9-French vascular sheath which was advanced  into the renal pelvis and utilized to advance an additional Benson wire into the renal collecting system to be used as a Museum/gallery conservator.  The vascular sheath was exchanged for a Kumpe catheter which was manipulated to the level of the urinary bladder with the use of a stiff Glidewire.  The Kumpe catheter was utilized to estimate to appropriate length of the ureteral stent.  An 8-French, 28 cm ureteral stent was then advanced over an Amplatz superstiff guidewire.  The distal portion was formed at the level of the bladder.  Proximal portion was then formed at the level of the renal collecting system.  Contrast injection was performed demonstrating brisk passage of contrast to the double-J ureteral stent and into the urinary bladder. As such, the percutaneous access site was abandoned. A dressing was placed.  The patient tolerated procedure well without immediate postprocedural complication.  Findings:  Nephrostogram demonstrates appropriate positioning of the existing left-sided percutaneous nephrostomy catheter with resolution of previously noted marked hydronephrosis. There are no discrete irregular filling defects within the left ureter.  A left-sided ureteral stent was able to be placed spanning from the left renal  collecting system to the bladder. Contrast injection confirmed appropriate positioning of functioning of the ureteral stent as such, percutaneous access was abandoned.  IMPRESSION:  Successful conversion of existing left sided nephrostomy catheter to a 28 cm ureteral stent.   Original Report Authenticated By: Tacey Ruiz, MD    Ir Perc Nephrostomy Left  04/20/2012  *RADIOLOGY REPORT*  Indication: History of enlarged prostate, now with left sided hydronephrosis, post failed attempted retrograde ureteral stent placement  1.  ULTRASOUND GUIDANCE FOR PUNCTURE OF THE LEFT RENAL COLLECTING SYSTEM. 2.  LEFT PERCUTANEOUS NEPHROSTOMY TUBE PLACEMENT  Comparison: Renal ultrasound - 04/15/2012; CT abdomen pelvis - 02/07/2012 [facility] examination  Sedation: Fentanyl 150 mcg IV; Versed 4 mg IV; Ciprofloxacin 400 mg IV;  The antibiotic was administered in an appropriate time frame prior to skin puncture.  Contrast: 25 Omnipaque-300 in addition into the collecting system  Total Moderate Sedation Time: 40 minutes.  Fluoroscopy Time: 8.5 minutes.  Complications: None immediate  Procedure:  The procedure, risks, benefits, and alternatives were explained to the patient.  Questions regarding the procedure were encouraged and answered.  The patient understands and consents to the procedure. A timeout was performed prior to the initiation of the procedure.  The left flank region was prepped with Betadine in a sterile fashion, and a sterile drape was applied covering the operative field.  A sterile gown and sterile gloves were used for the procedure. Local anesthesia was provided with 1% Lidocaine with epinephrine.  Ultrasound was used to localize the  kidney.  Under direct ultrasound guidance, a 21 gauge needle was advanced into the renal collecting system.  An ultrasound image documentation was performed.  Access within the collecting system was confirmed with the efflux of urine followed by contrast injection.   Over a Nitrex wire, the tract was dilated with an Accustick stent. Over a Benson wire, the track was dilated and a 10 French nephrostomy catheter was attempted to be advanced into the collecting system, however as there was some resistance, the nephrostomy was exchanged for a Kumpe catheter.  Now over an Amplatz stiff guide wire, a 10 French nephrostomy catheter was advanced into the left renal pelvis, coiled and locked.  Contrast was injected and several sport radiographs were obtained in various obliquities confirming access.  The catheter was secured at the skin with a  Prolene retention suture and a gravity bag was placed.  A dressing was placed.  The patient tolerated procedure well without immediate postprocedural complication.  Findings:  Ultrasound scanning demonstrates a severely dilated left-sided renal collecting system, no outside abdominal CT.  Under ultrasound guidance, a posterior inferior calix was targeted ultimately allowing placement of an 10-French percutaneous nephrostomy catheter under intermittent fluoroscopic guidance. Contrast injection confirmed appropriate positioning.  A small amount of postprocedural blood was noted within the collecting system. Incidental note is made of partial duplication of the left renal collecting system.  IMPRESSION:  Successful ultrasound and fluoroscopic guided placement of a left sided 10 French PCN.  Plan:  The patient may return at a later date for formal antegrade nephrostogram and potential internalization of the nephrostomy catheter.   Original Report Authenticated By: Tacey Ruiz, MD    Ir US Guide Bx Asp/drain  04/20/2012  *RADIOLOGY REPORT*  Indication: History of enlarged prostate, now with left sided hydronephrosis, post failed attempted retrograde ureteral stent placement  1.  ULTRASOUND GUIDANCE FOR PUNCTURE OF THE LEFT RENAL COLLECTING SYSTEM. 2.  LEFT PERCUTANEOUS NEPHROSTOMY TUBE PLACEMENT  Comparison: Renal ultrasound - 04/15/2012; CT abdomen  pelvis - 02/07/2012 [facility] examination  Sedation: Fentanyl 150 mcg IV; Versed 4 mg IV; Ciprofloxacin 400 mg IV;  The antibiotic was administered in an appropriate time frame prior to skin puncture.  Contrast: 25 Omnipaque-300 in addition into the collecting system  Total Moderate Sedation Time: 40 minutes.  Fluoroscopy Time: 8.5 minutes.  Complications: None immediate  Procedure:  The procedure, risks, benefits, and alternatives were explained to the patient.  Questions regarding the procedure were encouraged and answered.  The patient understands and consents to the procedure. A timeout was performed prior to the initiation of the procedure.  The left flank region was prepped with Betadine in a sterile fashion, and a sterile drape was applied covering the operative field.  A sterile gown and sterile gloves were used for the procedure. Local anesthesia was provided with 1% Lidocaine with epinephrine.  Ultrasound was used to localize the  kidney.  Under direct ultrasound guidance, a 21 gauge needle was advanced into the renal collecting system.  An ultrasound image documentation was performed.  Access within the collecting system was confirmed with the efflux of urine followed by contrast injection.  Over a Nitrex wire, the tract was dilated with an Accustick stent. Over a Benson wire, the track was dilated and a 10 French nephrostomy catheter was attempted to be advanced into the collecting system, however as there was some resistance, the nephrostomy was exchanged for a Kumpe catheter.  Now over an Amplatz stiff guide wire, a 10 French nephrostomy catheter was advanced into the left renal pelvis, coiled and locked.  Contrast was injected and several sport radiographs were obtained in various obliquities confirming access.  The catheter was secured at the skin with a Prolene retention suture and a gravity bag was placed.  A dressing was placed.  The patient tolerated procedure well without  immediate postprocedural complication.  Findings:  Ultrasound scanning demonstrates a severely dilated left-sided renal collecting system, no outside abdominal CT.  Under ultrasound guidance, a posterior inferior calix was targeted ultimately allowing placement of an 10-French percutaneous nephrostomy catheter under intermittent fluoroscopic guidance. Contrast injection confirmed appropriate positioning.  A small amount of postprocedural blood was noted within the collecting system. Incidental note is made of partial duplication of the left renal collecting system.  IMPRESSION:  Successful ultrasound and  fluoroscopic guided placement of a left sided 10 [place] PCN.  Plan:  The patient may return at a later date for formal antegrade nephrostogram and potential internalization of the nephrostomy catheter.   Original Report Authenticated By: Tacey Ruiz, MD    Dg Chest Port 1 View  05/09/2012  *RADIOLOGY REPORT*  Clinical Data: Fever, weakness  PORTABLE CHEST - 1 VIEW  Comparison: Prior chest x-ray 01/22/2011  Findings: Very low inspiratory volumes with patchy bibasilar opacities.  Low inspiratory volumes exaggerates the cardial pericardial silhouette result in crowding of the pulmonary vasculature.  The heart size is likely within normal limits.  No pleural effusion or pneumothorax.  No acute osseous abnormality.  IMPRESSION: Very low inspiratory volumes with bibasilar opacities which may reflect atelectasis or early infiltrate.  Dedicated PA and lateral chest x-ray may be helpful if clinically warranted.   Original Report Authenticated By: Malachy Moan, M.D.    Ir [place] Abts Cath Perc Left  05/05/2012  *RADIOLOGY REPORT*  Clinical Data: Left sided hydronephrosis  1.  LEFT SIDED NEPHROSTOGRAM. 2.  LEFT SIDED URETERAL STENT PLACEMENT  Comparison:  Left-sided percutaneous nephrostomy catheter placement - 02/18/2013  Intravenous medications:  Versed 4 mg IV; Fentanyl 200 mcg IV; Ciprofloxacin 400 mg IV the  antibiotics were administered within 1 hour of the procedure  Total Moderate Sedation Time: 25 minutes.  Contrast:  50 ml Omnipaque-300; administered into the urinary system  Fluoroscopy Time: 8.5 minutes.  Complications:  None immediate  Procedure:  The procedure, risks, benefits, and alternatives were explained to the patient.  Questions regarding the procedure were encouraged and answered.  The patient understands and consents to the procedure.  The nephrostomy tubes and surrounding skin were prepped with Betadine in a sterile fashion, and a sterile drape was applied covering the operative field.  A sterile gown and sterile gloves were used for the procedure. Local anesthesia was provided with 1% Lidocaine.  The preexisting left-sided nephrostomy tube was injected with contrast material.  Fluoroscopic spot images were obtained of the collecting system and ureter to the level of the urinary bladder. The existing nephrostomy catheter was cut and cannulated with a Benson wire.  Under intermittent fluoroscopic guidance, the existing nephrostomy catheter was exchanged for a 9-French vascular sheath which was advanced into the renal pelvis and utilized to advance an additional Benson wire into the renal collecting system to be used as a Museum/gallery conservator.  The vascular sheath was exchanged for a Kumpe catheter which was manipulated to the level of the urinary bladder with the use of a stiff Glidewire.  The Kumpe catheter was utilized to estimate to appropriate length of the ureteral stent.  An 8-French, 28 cm ureteral stent was then advanced over an Amplatz superstiff guidewire.  The distal portion was formed at the level of the bladder.  Proximal portion was then formed at the level of the renal collecting system.  Contrast injection was performed demonstrating brisk passage of contrast to the double-J ureteral stent and into the urinary bladder. As such, the percutaneous access site was abandoned. A dressing was placed.   The patient tolerated procedure well without immediate postprocedural complication.  Findings:  Nephrostogram demonstrates appropriate positioning of the existing left-sided percutaneous nephrostomy catheter with resolution of previously noted marked hydronephrosis. There are no discrete irregular filling defects within the left ureter.  A left-sided ureteral stent was able to be placed spanning from the left renal collecting system to the bladder. Contrast injection confirmed appropriate positioning of functioning  of the ureteral stent as such, percutaneous access was abandoned.  IMPRESSION:  Successful conversion of existing left sided nephrostomy catheter to a 28 cm ureteral stent.   Original Report Authenticated By: Tacey Ruiz, MD     Scheduled Meds: . amLODipine  5 mg Oral q morning - 10a  . omega-3 acid ethyl esters  2 g Oral Daily  . piperacillin-tazobactam (ZOSYN)  IV  3.375 g Intravenous Q8H  . simvastatin  20 mg Oral q1800  . vancomycin  1,250 mg Intravenous Q24H   Continuous Infusions: . sodium chloride 75 mL/hr at 05/11/12 0136    Time spent: 25 minutes.   LOS: 2 days   Albert Hicks  [facility] Pager [phone].  If 8PM-8AM, please contact night-coverage at [email or web address], password [facility] 05/11/2012, 10:21 AM

## 2012-05-11 NOTE — Progress Notes (Signed)
Physical Therapy Treatment Patient Details Name: Albert Hicks MRN: [id number] DOB: 11-Jun-1940 Today's Date: 05/11/2012 Time: 1478-2956 PT Time Calculation (min): 24 min  PT Assessment / Plan / Recommendation Comments on Treatment Session  Pt states he feels alittle better today but has had loose stools last 2 days with freq trips to [facility]. Amb pt in hallway.    Follow Up Recommendations  No PT follow up     Does the patient have the potential to tolerate intense rehabilitation     Barriers to Discharge        Equipment Recommendations  None recommended by PT    Recommendations for Other Services    Frequency Min 3X/week   Plan      Precautions / Restrictions Precautions Precautions: None Restrictions Weight Bearing Restrictions: No   Pertinent Vitals/Pain No c/o pain    Mobility  Bed Mobility Bed Mobility: Supine to Sit;Sit to Supine Supine to Sit: 5: Supervision Sit to Supine: 5: Supervision Details for Bed Mobility Assistance: Pt uses momentum to get up into full upright  Transfers Transfers: Sit to Stand;Stand to Sit Sit to Stand: 5: Supervision;From bed Stand to Sit: 5: Supervision;To bed Details for Transfer Assistance: Increased time to rise, especially from lower surfaces,  and uncontrolled descent noted.  Ambulation/Gait Ambulation/Gait Assistance: 4: Min guard;5: Supervision Ambulation Distance (Feet): 350 Feet Assistive device: None Ambulation/Gait Assistance Details: Noted increased balance and tolerance today.  No LOB.  Good alternationg gait. One VC with safety on turns in reguard to IV line. Gait velocity: WFL          PT Goals                                        progressing    Visit Information  Last PT Received On: 05/11/12 Assistance Needed: +1    Subjective Data  Subjective: I'm feeling alittle better but still no appetite Patient Stated Goal: home   Cognition    good   Balance   fair +  End of Session PT - End of  Session Equipment Utilized During Treatment: Gait belt Activity Tolerance: Patient tolerated treatment well Patient left: in bed;with call bell/phone within reach   Felecia Shelling  PTA [facility]  Acute  Rehab Pager      [phone]

## 2012-05-11 NOTE — Progress Notes (Signed)
  Subjective: Patient reports: Has diarrhea.  No dysuria. Voids well.  Objective: Vital signs in last 24 hours: Temp:  [98.1 F (36.7 C)-102.5 F (39.2 C)] 101.7 F (38.7 C) (02/18 0713) Pulse Rate:  [75-83] 83 (02/18 0713) Resp:  [18] 18 (02/18 0713) BP: (137-165)/(61-77) 137/61 mmHg (02/18 0713) SpO2:  [98 %-99 %] 98 % (02/18 0713) Weight:  [207 lb 0.2 oz (93.9 kg)] 207 lb 0.2 oz (93.9 kg) (02/18 0624)  Intake/Output from previous day: 02/17 0701 - 02/18 0700 In: 2680 [P.O.:480; I.V.:1800; IV Piggyback:400] Out: 300 [Urine:300] Intake/Output this shift:    Physical Exam:   Lungs - Normal respiratory effort, chest expands symmetrically.  Abdomen - Soft, non-tender & non-distended Urine culture: pending Lab Results:  Recent Labs  05/09/12 2000 05/10/12 0509 05/11/12 0446  HGB 12.1* 11.3* 12.6*  HCT 34.7* 33.8* 37.1*   BMET  Recent Labs  05/10/12 0509 05/11/12 0446  NA 132* 135  K 3.4* 3.4*  CL 100 99  CO2 22 22  GLUCOSE 109* 104*  BUN 21 18  CREATININE 1.67* 1.71*  CALCIUM 8.7 9.0    Recent Labs  05/09/12 2000  INR 1.18   No results found for this basename: LABURIN,  in the last 72 hours Results for orders placed during the hospital encounter of 05/09/12  CULTURE, BLOOD (ROUTINE X 2)     Status: None   Collection Time    05/10/12  5:05 AM      Result Value Range Status   Specimen Description BLOOD RIGHT ANTECUBITAL   Final   Special Requests BOTTLES DRAWN AEROBIC AND ANAEROBIC   Final   Culture  Setup Time 05/10/2012 08:26   Final   Culture     Final   Value:        BLOOD CULTURE RECEIVED NO GROWTH TO DATE CULTURE WILL BE HELD FOR 5 DAYS BEFORE ISSUING A FINAL NEGATIVE REPORT   Report Status PENDING   Incomplete  CULTURE, BLOOD (ROUTINE X 2)     Status: None   Collection Time    05/10/12  5:09 AM      Result Value Range Status   Specimen Description BLOOD RIGHT HAND   Final   Special Requests BOTTLES DRAWN AEROBIC AND ANAEROBIC    Final   Culture  Setup Time 05/10/2012 08:26   Final   Culture     Final   Value:        BLOOD CULTURE RECEIVED NO GROWTH TO DATE CULTURE WILL BE HELD FOR 5 DAYS BEFORE ISSUING A FINAL NEGATIVE REPORT   Report Status PENDING   Incomplete    Studies/Results: Dg Chest Port 1 View  05/09/2012  *RADIOLOGY REPORT*  Clinical Data: Fever, weakness  PORTABLE CHEST - 1 VIEW  Comparison: Prior chest x-ray 01/22/2011  Findings: Very low inspiratory volumes with patchy bibasilar opacities.  Low inspiratory volumes exaggerates the cardial pericardial silhouette result in crowding of the pulmonary vasculature.  The heart size is likely within normal limits.  No pleural effusion or pneumothorax.  No acute osseous abnormality.  IMPRESSION: Very low inspiratory volumes with bibasilar opacities which may reflect atelectasis or early infiltrate.  Dedicated PA and lateral chest x-ray may be helpful if clinically warranted.   Original Report Authenticated By: Malachy Moan, M.D.     Assessment/Plan:  Left hydronephrosis.  UTI.  ? Pseudomembranous colitis.  C. Difficile culture   LOS: 2 days   Albert Hicks 05/11/2012, 8:55 AM

## 2012-05-12 ENCOUNTER — Inpatient Hospital Stay (HOSPITAL_COMMUNITY): Payer: Medicare Other

## 2012-05-12 DIAGNOSIS — E871 Hypo-osmolality and hyponatremia: Secondary | ICD-10-CM

## 2012-05-12 DIAGNOSIS — A419 Sepsis, unspecified organism: Principal | ICD-10-CM

## 2012-05-12 LAB — CREATININE, SERUM
Creatinine, Ser: 1.43 mg/dL — ABNORMAL HIGH (ref 0.50–1.35)
GFR calc Af Amer: 55 mL/min — ABNORMAL LOW (ref >90)
GFR calc non Af Amer: 48 mL/min — ABNORMAL LOW (ref >90)

## 2012-05-12 LAB — VANCOMYCIN, TROUGH: Vancomycin Tr: 7.1 ug/mL — ABNORMAL LOW (ref 10.0–20.0)

## 2012-05-12 MED ORDER — VANCOMYCIN HCL 10 G IV SOLR
1500.0000 mg | INTRAVENOUS | Status: DC
  Filled 2012-05-12: qty 1500

## 2012-05-12 NOTE — Progress Notes (Signed)
Brief Pharmacy Note: Vancomycin  Vancomycin 1250mg  q24hr for pyelonephritis, concurrent Zosyn. Renal fx improving.  Vanc trough 7.4 mcg/ml, aiming for 15-20; clearing Vancomycin without difficulty.  Urine cx negative, antibiotics may be narrowed.  Will increase Vancomycin to 1500mg  q24 hr with next dose tomorrow.  Thank you, Otho Bellows PharmD Pager [phone] 05/12/2012 6:57 PM

## 2012-05-12 NOTE — Progress Notes (Signed)
ANTIBIOTIC CONSULT NOTE - FOLLOW UP  Pharmacy Consult for Vancomycin and Zosyn Indication: Pyelonephritis/Sepsis  Allergies  Allergen Reactions  . Lipitor (Atorvastatin)     Muscle ache    Patient Measurements: Height: 6\' 1"  (185.4 cm) Weight: 206 lb 9.6 oz (93.713 kg) IBW/kg (Calculated) : 79.9  Vital Signs: Temp: 98.8 F (37.1 C) (02/19 1147) Temp src: Oral (02/19 1147) BP: 134/74 mmHg (02/19 0500) Pulse Rate: 74 (02/19 0500) Intake/Output from previous day: 02/18 0701 - 02/19 0700 In: 2343.8 [P.O.:240; I.V.:1703.8; IV Piggyback:400] Out: -  Intake/Output from this shift:    Labs:  Recent Labs  05/09/12 2000 05/10/12 0509 05/11/12 0446  WBC 14.4* 11.8* 11.0*  HGB 12.1* 11.3* 12.6*  PLT 291 264 319  CREATININE 1.76* 1.67* 1.71*   Estimated Creatinine Clearance: 44.8 ml/min (by C-G formula based on Cr of 1.71).  40 ml/min/1.47m2 (normalized)   Microbiology: Recent Results (from the past 720 hour(s))  CULTURE, BLOOD (ROUTINE X 2)     Status: None   Collection Time    05/10/12  5:05 AM      Result Value Range Status   Specimen Description BLOOD RIGHT ANTECUBITAL   Final   Special Requests BOTTLES DRAWN AEROBIC AND ANAEROBIC   Final   Culture  Setup Time 05/10/2012 08:26   Final   Culture     Final   Value:        BLOOD CULTURE RECEIVED NO GROWTH TO DATE CULTURE WILL BE HELD FOR 5 DAYS BEFORE ISSUING A FINAL NEGATIVE REPORT   Report Status PENDING   Incomplete  CULTURE, BLOOD (ROUTINE X 2)     Status: None   Collection Time    05/10/12  5:09 AM      Result Value Range Status   Specimen Description BLOOD RIGHT HAND   Final   Special Requests BOTTLES DRAWN AEROBIC AND ANAEROBIC   Final   Culture  Setup Time 05/10/2012 08:26   Final   Culture     Final   Value:        BLOOD CULTURE RECEIVED NO GROWTH TO DATE CULTURE WILL BE HELD FOR 5 DAYS BEFORE ISSUING A FINAL NEGATIVE REPORT   Report Status PENDING   Incomplete  URINE CULTURE     Status: None   Collection Time    05/10/12 10:55 AM      Result Value Range Status   Specimen Description URINE, CLEAN CATCH   Final   Special Requests NONE   Final   Culture  Setup Time 05/11/2012 01:46   Final   Colony Count NO GROWTH   Final   Culture NO GROWTH   Final   Report Status 05/11/2012 FINAL   Final  CLOSTRIDIUM DIFFICILE BY PCR     Status: None   Collection Time    05/11/12 11:26 AM      Result Value Range Status   C difficile by pcr NEGATIVE  NEGATIVE Final    Anti-infectives   Start     Dose/Rate Route Frequency Ordered Stop   05/10/12 1800  vancomycin (VANCOCIN) 1,250 mg in sodium chloride 0.9 % 250 mL IVPB     1,250 mg 166.7 mL/hr over 90 Minutes Intravenous Every 24 hours 05/09/12 2050     05/10/12 0600  piperacillin-tazobactam (ZOSYN) IVPB 3.375 g     3.375 g 12.5 mL/hr over 240 Minutes Intravenous Every 8 hours 05/09/12 2050     05/09/12 1930  vancomycin (VANCOCIN) IVPB 1000 mg/200 mL  premix     1,000 mg 200 mL/hr over 60 Minutes Intravenous  Once 05/09/12 1917 05/09/12 2336   05/09/12 1930  piperacillin-tazobactam (ZOSYN) IVPB 3.375 g     3.375 g 12.5 mL/hr over 240 Minutes Intravenous  Once 05/09/12 1917 05/10/12 0339      Assessment: 72 yo male with L PCN placed on 1/28 and exchanged for JJ stent on 2/12, presented 2/15 with fever and flank pain.  Today is day #4 Vanc/Zosyn for presumed pyelonephritis with sepsis  SCr elevated 1.71 on 2/18 with CrCl ~35ml/min  Temps improving, max 100.2 in past 24hrs  WBC elevated but improved from admission  Urine cx negative, blood negative to date   Goal of Therapy:  Vancomycin trough level 15-20 mcg/ml  Plan:   Check Vancomycin trough and SCr tonight before 4th dose; this will not be at true steady state given age and renal function, but will help r/o accumulation.  Continue Zosyn 3.375gm IV q8h (4hr extended infusions) Follow up cultures and de-escalate abx as appropriate  Loralee Pacas, PharmD, BCPS Pager:  [phone] 05/12/2012,11:51 AM

## 2012-05-12 NOTE — Progress Notes (Signed)
[facility] PROGRESS NOTE  Albert Hicks ZOX:[id number] DOB: 16-Oct-1940 DOA: 05/09/2012 PCP: Tandy Gaw, PA-C  Assessment/Plan: Sepsis due to a urinary tract infection/ Pyelonephritis / obstruction of kidney / leukocytosis  -Still having fevers but trending down, 101.40F on 05/11/2012 p.m. -CT abdomen and pelvis without contrast to rule out abscess -Complicated in the setting of recent instrumentation and indwelling JJ stent.  -Seen by Dr. Brunilda Payor of Urology with recommendations to continue antibiotics.  -Continue empiric Vancomycin and Zosyn. Followup urine cultures   -Blood cultures negative to date -Lactic acid level 1.0 and procalcitonin level 1.2 on admission.  -Status post left JJ stent 05/05/12 with internalization of left nephrostomy tube at that time Active Problems:  Diarrhea  -Check stools for C. Diff PCR-negative--loose stools have improved Hyperlipidemia  -Continue atorvastatin.  Hypertension  -Continue Norvasc. Hold hydrochlorothiazide. This is not a good medication to use in patients with chronic kidney disease  -Blood pressures well controlled CKD (chronic kidney disease) stage 3, GFR 30-59 ml/min  -Monitor creatinine. Continue IV fluids.  Hypokalemia  -Place on routine potassium supplementation.  -Check magnesium - Recheck BMP Hyponatremia  -Likely a combination of HCTZ and volume depletion -Mild. Monitor.      Family Communication:   wife at beside Disposition Plan:   Home when medically stable    Antibiotics:  Vancomycin/Zosyn 05/09/2012>>>    Procedures/Studies: Ir Nephrostogram Left  05/05/2012  *RADIOLOGY REPORT*  Clinical Data: Left sided hydronephrosis  1.  LEFT SIDED NEPHROSTOGRAM. 2.  LEFT SIDED URETERAL STENT PLACEMENT  Comparison:  Left-sided percutaneous nephrostomy catheter placement - 02/18/2013  Intravenous medications:  Versed 4 mg IV; Fentanyl 200 mcg IV; Ciprofloxacin 400 mg IV the antibiotics were administered within 1 hour of  the procedure  Total Moderate Sedation Time: 25 minutes.  Contrast:  50 ml Omnipaque-300; administered into the urinary system  Fluoroscopy Time: 8.5 minutes.  Complications:  None immediate  Procedure:  The procedure, risks, benefits, and alternatives were explained to the patient.  Questions regarding the procedure were encouraged and answered.  The patient understands and consents to the procedure.  The nephrostomy tubes and surrounding skin were prepped with Betadine in a sterile fashion, and a sterile drape was applied covering the operative field.  A sterile gown and sterile gloves were used for the procedure. Local anesthesia was provided with 1% Lidocaine.  The preexisting left-sided nephrostomy tube was injected with contrast material.  Fluoroscopic spot images were obtained of the collecting system and ureter to the level of the urinary bladder. The existing nephrostomy catheter was cut and cannulated with a Benson wire.  Under intermittent fluoroscopic guidance, the existing nephrostomy catheter was exchanged for a 9-French vascular sheath which was advanced into the renal pelvis and utilized to advance an additional Benson wire into the renal collecting system to be used as a Museum/gallery conservator.  The vascular sheath was exchanged for a Kumpe catheter which was manipulated to the level of the urinary bladder with the use of a stiff Glidewire.  The Kumpe catheter was utilized to estimate to appropriate length of the ureteral stent.  An 8-French, 28 cm ureteral stent was then advanced over an Amplatz superstiff guidewire.  The distal portion was formed at the level of the bladder.  Proximal portion was then formed at the level of the renal collecting system.  Contrast injection was performed demonstrating brisk passage of contrast to the double-J ureteral stent and into the urinary bladder. As such, the percutaneous access site was abandoned. A dressing  was placed.  The patient tolerated procedure well without  immediate postprocedural complication.  Findings:  Nephrostogram demonstrates appropriate positioning of the existing left-sided percutaneous nephrostomy catheter with resolution of previously noted marked hydronephrosis. There are no discrete irregular filling defects within the left ureter.  A left-sided ureteral stent was able to be placed spanning from the left renal collecting system to the bladder. Contrast injection confirmed appropriate positioning of functioning of the ureteral stent as such, percutaneous access was abandoned.  IMPRESSION:  Successful conversion of existing left sided nephrostomy catheter to a 28 cm ureteral stent.   Original Report Authenticated By: Tacey Ruiz, MD    Ir Perc Nephrostomy Left  04/20/2012  *RADIOLOGY REPORT*  Indication: History of enlarged prostate, now with left sided hydronephrosis, post failed attempted retrograde ureteral stent placement  1.  ULTRASOUND GUIDANCE FOR PUNCTURE OF THE LEFT RENAL COLLECTING SYSTEM. 2.  LEFT PERCUTANEOUS NEPHROSTOMY TUBE PLACEMENT  Comparison: Renal ultrasound - 04/15/2012; CT abdomen pelvis - 02/07/2012 [facility] examination  Sedation: Fentanyl 150 mcg IV; Versed 4 mg IV; Ciprofloxacin 400 mg IV;  The antibiotic was administered in an appropriate time frame prior to skin puncture.  Contrast: 25 Omnipaque-300 in addition into the collecting system  Total Moderate Sedation Time: 40 minutes.  Fluoroscopy Time: 8.5 minutes.  Complications: None immediate  Procedure:  The procedure, risks, benefits, and alternatives were explained to the patient.  Questions regarding the procedure were encouraged and answered.  The patient understands and consents to the procedure. A timeout was performed prior to the initiation of the procedure.  The left flank region was prepped with Betadine in a sterile fashion, and a sterile drape was applied covering the operative field.  A sterile gown and sterile gloves were used for the  procedure. Local anesthesia was provided with 1% Lidocaine with epinephrine.  Ultrasound was used to localize the  kidney.  Under direct ultrasound guidance, a 21 gauge needle was advanced into the renal collecting system.  An ultrasound image documentation was performed.  Access within the collecting system was confirmed with the efflux of urine followed by contrast injection.  Over a Nitrex wire, the tract was dilated with an Accustick stent. Over a Benson wire, the track was dilated and a 10 French nephrostomy catheter was attempted to be advanced into the collecting system, however as there was some resistance, the nephrostomy was exchanged for a Kumpe catheter.  Now over an Amplatz stiff guide wire, a 10 French nephrostomy catheter was advanced into the left renal pelvis, coiled and locked.  Contrast was injected and several sport radiographs were obtained in various obliquities confirming access.  The catheter was secured at the skin with a Prolene retention suture and a gravity bag was placed.  A dressing was placed.  The patient tolerated procedure well without immediate postprocedural complication.  Findings:  Ultrasound scanning demonstrates a severely dilated left-sided renal collecting system, no outside abdominal CT.  Under ultrasound guidance, a posterior inferior calix was targeted ultimately allowing placement of an 10-French percutaneous nephrostomy catheter under intermittent fluoroscopic guidance. Contrast injection confirmed appropriate positioning.  A small amount of postprocedural blood was noted within the collecting system. Incidental note is made of partial duplication of the left renal collecting system.  IMPRESSION:  Successful ultrasound and fluoroscopic guided placement of a left sided 10 French PCN.  Plan:  The patient may return at a later date for formal antegrade nephrostogram and potential internalization of the nephrostomy catheter.   Original Report  Authenticated By: Tacey Ruiz,  MD    Ir US Guide Bx Asp/drain  04/20/2012  *RADIOLOGY REPORT*  Indication: History of enlarged prostate, now with left sided hydronephrosis, post failed attempted retrograde ureteral stent placement  1.  ULTRASOUND GUIDANCE FOR PUNCTURE OF THE LEFT RENAL COLLECTING SYSTEM. 2.  LEFT PERCUTANEOUS NEPHROSTOMY TUBE PLACEMENT  Comparison: Renal ultrasound - 04/15/2012; CT abdomen pelvis - 02/07/2012 [facility] examination  Sedation: Fentanyl 150 mcg IV; Versed 4 mg IV; Ciprofloxacin 400 mg IV;  The antibiotic was administered in an appropriate time frame prior to skin puncture.  Contrast: 25 Omnipaque-300 in addition into the collecting system  Total Moderate Sedation Time: 40 minutes.  Fluoroscopy Time: 8.5 minutes.  Complications: None immediate  Procedure:  The procedure, risks, benefits, and alternatives were explained to the patient.  Questions regarding the procedure were encouraged and answered.  The patient understands and consents to the procedure. A timeout was performed prior to the initiation of the procedure.  The left flank region was prepped with Betadine in a sterile fashion, and a sterile drape was applied covering the operative field.  A sterile gown and sterile gloves were used for the procedure. Local anesthesia was provided with 1% Lidocaine with epinephrine.  Ultrasound was used to localize the  kidney.  Under direct ultrasound guidance, a 21 gauge needle was advanced into the renal collecting system.  An ultrasound image documentation was performed.  Access within the collecting system was confirmed with the efflux of urine followed by contrast injection.  Over a Nitrex wire, the tract was dilated with an Accustick stent. Over a Benson wire, the track was dilated and a 10 French nephrostomy catheter was attempted to be advanced into the collecting system, however as there was some resistance, the nephrostomy was exchanged for a Kumpe catheter.  Now over an Amplatz stiff guide  wire, a 10 French nephrostomy catheter was advanced into the left renal pelvis, coiled and locked.  Contrast was injected and several sport radiographs were obtained in various obliquities confirming access.  The catheter was secured at the skin with a Prolene retention suture and a gravity bag was placed.  A dressing was placed.  The patient tolerated procedure well without immediate postprocedural complication.  Findings:  Ultrasound scanning demonstrates a severely dilated left-sided renal collecting system, no outside abdominal CT.  Under ultrasound guidance, a posterior inferior calix was targeted ultimately allowing placement of an 10-French percutaneous nephrostomy catheter under intermittent fluoroscopic guidance. Contrast injection confirmed appropriate positioning.  A small amount of postprocedural blood was noted within the collecting system. Incidental note is made of partial duplication of the left renal collecting system.  IMPRESSION:  Successful ultrasound and fluoroscopic guided placement of a left sided 10 French PCN.  Plan:  The patient may return at a later date for formal antegrade nephrostogram and potential internalization of the nephrostomy catheter.   Original Report Authenticated By: Tacey Ruiz, MD    Dg Chest Port 1 View  05/09/2012  *RADIOLOGY REPORT*  Clinical Data: Fever, weakness  PORTABLE CHEST - 1 VIEW  Comparison: Prior chest x-ray 01/22/2011  Findings: Very low inspiratory volumes with patchy bibasilar opacities.  Low inspiratory volumes exaggerates the cardial pericardial silhouette result in crowding of the pulmonary vasculature.  The heart size is likely within normal limits.  No pleural effusion or pneumothorax.  No acute osseous abnormality.  IMPRESSION: Very low inspiratory volumes with bibasilar opacities which may reflect atelectasis or early infiltrate.  Dedicated PA  and lateral chest x-ray may be helpful if clinically warranted.   Original Report Authenticated By:  Malachy Moan, M.D.    Ir [place] Abts Cath Perc Left  05/05/2012  *RADIOLOGY REPORT*  Clinical Data: Left sided hydronephrosis  1.  LEFT SIDED NEPHROSTOGRAM. 2.  LEFT SIDED URETERAL STENT PLACEMENT  Comparison:  Left-sided percutaneous nephrostomy catheter placement - 02/18/2013  Intravenous medications:  Versed 4 mg IV; Fentanyl 200 mcg IV; Ciprofloxacin 400 mg IV the antibiotics were administered within 1 hour of the procedure  Total Moderate Sedation Time: 25 minutes.  Contrast:  50 ml Omnipaque-300; administered into the urinary system  Fluoroscopy Time: 8.5 minutes.  Complications:  None immediate  Procedure:  The procedure, risks, benefits, and alternatives were explained to the patient.  Questions regarding the procedure were encouraged and answered.  The patient understands and consents to the procedure.  The nephrostomy tubes and surrounding skin were prepped with Betadine in a sterile fashion, and a sterile drape was applied covering the operative field.  A sterile gown and sterile gloves were used for the procedure. Local anesthesia was provided with 1% Lidocaine.  The preexisting left-sided nephrostomy tube was injected with contrast material.  Fluoroscopic spot images were obtained of the collecting system and ureter to the level of the urinary bladder. The existing nephrostomy catheter was cut and cannulated with a Benson wire.  Under intermittent fluoroscopic guidance, the existing nephrostomy catheter was exchanged for a 9-French vascular sheath which was advanced into the renal pelvis and utilized to advance an additional Benson wire into the renal collecting system to be used as a Museum/gallery conservator.  The vascular sheath was exchanged for a Kumpe catheter which was manipulated to the level of the urinary bladder with the use of a stiff Glidewire.  The Kumpe catheter was utilized to estimate to appropriate length of the ureteral stent.  An 8-French, 28 cm ureteral stent was then advanced over an  Amplatz superstiff guidewire.  The distal portion was formed at the level of the bladder.  Proximal portion was then formed at the level of the renal collecting system.  Contrast injection was performed demonstrating brisk passage of contrast to the double-J ureteral stent and into the urinary bladder. As such, the percutaneous access site was abandoned. A dressing was placed.  The patient tolerated procedure well without immediate postprocedural complication.  Findings:  Nephrostogram demonstrates appropriate positioning of the existing left-sided percutaneous nephrostomy catheter with resolution of previously noted marked hydronephrosis. There are no discrete irregular filling defects within the left ureter.  A left-sided ureteral stent was able to be placed spanning from the left renal collecting system to the bladder. Contrast injection confirmed appropriate positioning of functioning of the ureteral stent as such, percutaneous access was abandoned.  IMPRESSION:  Successful conversion of existing left sided nephrostomy catheter to a 28 cm ureteral stent.   Original Report Authenticated By: Tacey Ruiz, MD          Subjective: Patient complains of left flank pain with urination or bowel movement. Otherwise denies any abdominal pain at this time. Denies chest pain, shortness breath, nausea, vomiting, diarrhea, abdominal pain, dysuria, rashes. He had fevers and chills last night  Objective: Filed Vitals:   05/11/12 2138 05/12/12 0500 05/12/12 1147 05/12/12 1501  BP: 118/68 134/74  130/72  Pulse: 84 74  73  Temp: 100.2 F (37.9 C) 99.5 F (37.5 C) 98.8 F (37.1 C) 98.7 F (37.1 C)  TempSrc: Oral Oral Oral Oral  Resp:  18 18  19   Height:      Weight:  93.713 kg (206 lb 9.6 oz)    SpO2: 99% 99%  99%    Intake/Output Summary (Last 24 hours) at 05/12/12 1554 Last data filed at 05/12/12 0544  Gross per 24 hour  Intake 2293.75 ml  Output      0 ml  Net 2293.75 ml   Weight change: -0.187  kg (-6.6 oz) Exam:   General:  Pt is alert, follows commands appropriately, not in acute distress  HEENT: No icterus, No thrush, No neck mass, [place]/AT  Cardiovascular: RRR, S1/S2, no rubs, no gallops  Respiratory: CTA bilaterally, no wheezing, no crackles, no rhonchi  Abdomen: Soft/+BS, non tender, non distended, no guarding; left flank old nephrostomy site is healed without erythema or drainage  Extremities: No edema, No lymphangitis, No petechiae, No rashes, no synovitis  Data Reviewed: Basic Metabolic Panel:  Recent Labs Lab 05/09/12 2000 05/10/12 0509 05/11/12 0446  NA 132* 132* 135  K 3.3* 3.4* 3.4*  CL 99 100 99  CO2 24 22 22   GLUCOSE 117* 109* 104*  BUN 23 21 18   CREATININE 1.76* 1.67* 1.71*  CALCIUM 8.6 8.7 9.0  MG 1.8  --   --   PHOS 2.9  --   --    Liver Function Tests:  Recent Labs Lab 05/09/12 2000 05/10/12 0509  AST 31 31  ALT 31 34  ALKPHOS 62 62  BILITOT 0.7 0.5  PROT 7.4 7.2  ALBUMIN 3.1* 3.0*   No results found for this basename: LIPASE, AMYLASE,  in the last 168 hours No results found for this basename: AMMONIA,  in the last 168 hours CBC:  Recent Labs Lab 05/09/12 2000 05/10/12 0509 05/11/12 0446  WBC 14.4* 11.8* 11.0*  NEUTROABS 11.5*  --   --   HGB 12.1* 11.3* 12.6*  HCT 34.7* 33.8* 37.1*  MCV 95.1 95.2 96.4  PLT 291 264 319   Cardiac Enzymes: No results found for this basename: CKTOTAL, CKMB, CKMBINDEX, TROPONINI,  in the last 168 hours BNP: No components found with this basename: POCBNP,  CBG:  Recent Labs Lab 05/10/12 0752 05/10/12 1201 05/10/12 1335 05/10/12 1652  GLUCAP 108* 124* 103* 100*    Recent Results (from the past 240 hour(s))  CULTURE, BLOOD (ROUTINE X 2)     Status: None   Collection Time    05/10/12  5:05 AM      Result Value Range Status   Specimen Description BLOOD RIGHT ANTECUBITAL   Final   Special Requests BOTTLES DRAWN AEROBIC AND ANAEROBIC   Final   Culture  Setup Time 05/10/2012 08:26    Final   Culture     Final   Value:        BLOOD CULTURE RECEIVED NO GROWTH TO DATE CULTURE WILL BE HELD FOR 5 DAYS BEFORE ISSUING A FINAL NEGATIVE REPORT   Report Status PENDING   Incomplete  CULTURE, BLOOD (ROUTINE X 2)     Status: None   Collection Time    05/10/12  5:09 AM      Result Value Range Status   Specimen Description BLOOD RIGHT HAND   Final   Special Requests BOTTLES DRAWN AEROBIC AND ANAEROBIC   Final   Culture  Setup Time 05/10/2012 08:26   Final   Culture     Final   Value:        BLOOD CULTURE RECEIVED NO GROWTH TO DATE CULTURE WILL BE  HELD FOR 5 DAYS BEFORE ISSUING A FINAL NEGATIVE REPORT   Report Status PENDING   Incomplete  URINE CULTURE     Status: None   Collection Time    05/10/12 10:55 AM      Result Value Range Status   Specimen Description URINE, CLEAN CATCH   Final   Special Requests NONE   Final   Culture  Setup Time 05/11/2012 01:46   Final   Colony Count NO GROWTH   Final   Culture NO GROWTH   Final   Report Status 05/11/2012 FINAL   Final  CLOSTRIDIUM DIFFICILE BY PCR     Status: None   Collection Time    05/11/12 11:26 AM      Result Value Range Status   C difficile by pcr NEGATIVE  NEGATIVE Final     Scheduled Meds: . amLODipine  5 mg Oral q morning - 10a  . feeding supplement  237 mL Oral TID WC  . omega-3 acid ethyl esters  2 g Oral Daily  . piperacillin-tazobactam (ZOSYN)  IV  3.375 g Intravenous Q8H  . potassium chloride  20 mEq Oral BID  . simvastatin  20 mg Oral q1800  . vancomycin  1,250 mg Intravenous Q24H   Continuous Infusions: . sodium chloride 75 mL/hr at 05/12/12 0543     Taneika Choi, DO  [facility] Pager [phone]  If 7PM-7AM, please contact night-coverage [email or web address] Password [facility] 05/12/2012, 3:54 PM   LOS: 3 days

## 2012-05-12 NOTE — Progress Notes (Signed)
Subjective: Patient reports Feels better.  Diarrhea improving. Has left flank pain on urination secondary to stent. Temperature coming down Appetite still poor  Objective: Vital signs in last 24 hours: Temp:  [99.5 F (37.5 C)-101.6 F (38.7 C)] 99.5 F (37.5 C) (02/19 0500) Pulse Rate:  [74-88] 74 (02/19 0500) Resp:  [18] 18 (02/19 0500) BP: (118-134)/(60-74) 134/74 mmHg (02/19 0500) SpO2:  [97 %-99 %] 99 % (02/19 0500) Weight:  [206 lb 9.6 oz (93.713 kg)] 206 lb 9.6 oz (93.713 kg) (02/19 0500)  Intake/Output from previous day: 02/18 0701 - 02/19 0700 In: 2343.8 [P.O.:240; I.V.:1703.8; IV Piggyback:400] Out: -  Intake/Output this shift:    Physical Exam:   Lungs - Normal respiratory effort, chest expands symmetrically.  Abdomen - Soft, non-tender & non-distended Urine culture: no growth.   C. Diff: neg Lab Results:  Recent Labs  05/09/12 2000 05/10/12 0509 05/11/12 0446  HGB 12.1* 11.3* 12.6*  HCT 34.7* 33.8* 37.1*   BMET  Recent Labs  05/10/12 0509 05/11/12 0446  NA 132* 135  K 3.4* 3.4*  CL 100 99  CO2 22 22  GLUCOSE 109* 104*  BUN 21 18  CREATININE 1.67* 1.71*  CALCIUM 8.7 9.0    Recent Labs  05/09/12 2000  INR 1.18   No results found for this basename: LABURIN,  in the last 72 hours Results for orders placed during the hospital encounter of 05/09/12  CULTURE, BLOOD (ROUTINE X 2)     Status: None   Collection Time    05/10/12  5:05 AM      Result Value Range Status   Specimen Description BLOOD RIGHT ANTECUBITAL   Final   Special Requests BOTTLES DRAWN AEROBIC AND ANAEROBIC   Final   Culture  Setup Time 05/10/2012 08:26   Final   Culture     Final   Value:        BLOOD CULTURE RECEIVED NO GROWTH TO DATE CULTURE WILL BE HELD FOR 5 DAYS BEFORE ISSUING A FINAL NEGATIVE REPORT   Report Status PENDING   Incomplete  CULTURE, BLOOD (ROUTINE X 2)     Status: None   Collection Time    05/10/12  5:09 AM      Result Value Range Status   Specimen Description BLOOD RIGHT HAND   Final   Special Requests BOTTLES DRAWN AEROBIC AND ANAEROBIC   Final   Culture  Setup Time 05/10/2012 08:26   Final   Culture     Final   Value:        BLOOD CULTURE RECEIVED NO GROWTH TO DATE CULTURE WILL BE HELD FOR 5 DAYS BEFORE ISSUING A FINAL NEGATIVE REPORT   Report Status PENDING   Incomplete  URINE CULTURE     Status: None   Collection Time    05/10/12 10:55 AM      Result Value Range Status   Specimen Description URINE, CLEAN CATCH   Final   Special Requests NONE   Final   Culture  Setup Time 05/11/2012 01:46   Final   Colony Count NO GROWTH   Final   Culture NO GROWTH   Final   Report Status 05/11/2012 FINAL   Final  CLOSTRIDIUM DIFFICILE BY PCR     Status: None   Collection Time    05/11/12 11:26 AM      Result Value Range Status   C difficile by pcr NEGATIVE  NEGATIVE Final    Studies/Results: No results found.  Assessment/Plan:  Left hydronephrosis.  BPH  Advance diet as tolerted    LOS: 3 days   Albert Hicks 05/12/2012, 8:17 AM

## 2012-05-13 LAB — BASIC METABOLIC PANEL
BUN: 14 mg/dL (ref 6–23)
CO2: 23 mEq/L (ref 19–32)
Calcium: 8.8 mg/dL (ref 8.4–10.5)
Chloride: 107 mEq/L (ref 96–112)
Creatinine, Ser: 1.5 mg/dL — ABNORMAL HIGH (ref 0.50–1.35)
GFR calc Af Amer: 52 mL/min — ABNORMAL LOW (ref >90)
GFR calc non Af Amer: 45 mL/min — ABNORMAL LOW (ref >90)
Glucose, Bld: 95 mg/dL (ref 70–99)
Potassium: 4 mEq/L (ref 3.5–5.1)
Sodium: 141 mEq/L (ref 135–145)

## 2012-05-13 LAB — CBC
HCT: 35 % — ABNORMAL LOW (ref 39.0–52.0)
Hemoglobin: 11.7 g/dL — ABNORMAL LOW (ref 13.0–17.0)
MCH: 32.1 pg (ref 26.0–34.0)
MCHC: 33.4 g/dL (ref 30.0–36.0)
MCV: 96.2 fL (ref 78.0–100.0)
Platelets: 357 10*3/uL (ref 150–400)
RBC: 3.64 MIL/uL — ABNORMAL LOW (ref 4.22–5.81)
RDW: 13.2 % (ref 11.5–15.5)
WBC: 6.9 10*3/uL (ref 4.0–10.5)

## 2012-05-13 LAB — MAGNESIUM: Magnesium: 1.9 mg/dL (ref 1.5–2.5)

## 2012-05-13 MED ORDER — LEVOFLOXACIN 750 MG PO TABS: 750.0000 mg | ORAL_TABLET | ORAL | Status: DC

## 2012-05-13 MED ORDER — LEVOFLOXACIN 750 MG PO TABS
750.0000 mg | ORAL_TABLET | ORAL | Status: DC
  Filled 2012-05-13: qty 1

## 2012-05-13 NOTE — Discharge Summary (Signed)
Physician Discharge Summary  Albert Hicks ZOX:[id number] DOB: 1940-08-28 DOA: 05/09/2012  PCP: Tandy Gaw, PA-C  Admit date: 05/09/2012 Discharge date: 05/13/2012  Recommendations for Outpatient Follow-up:  1. Pt will need to follow up with PCP in 2 weeks post discharge 2. Please obtain BMP to evaluate electrolytes and kidney function 3. Please also check CBC to evaluate Hg and Hct levels 4. Stop HCTZ.  Discuss with PCP if any additional antiHTN meds are needed  Discharge Diagnoses:  Principal Problem:   Pyelonephritis Active Problems:   Hyperlipidemia   Hypertension   Obstruction of kidney   CKD (chronic kidney disease) stage 3, GFR 30-59 ml/min   Hypokalemia   Sepsis due to urinary tract infection   Hyponatremia   Leukocytosis, unspecified   Diarrhea Sepsis due to a urinary tract infection/ Pyelonephritis / obstruction of kidney / leukocytosis  - Patient subsequently defervescence - Patient remained afebrile for greater than 24 hours prior to discharge -CT abdomen and pelvis without contrast was obtained due to his slow febrile response--negative for abscess, but showed continue left-sided hydronephrosis -Complicated in the setting of recent instrumentation and indwelling JJ stent.  -Seen by Dr. Brunilda Payor of Urology with recommendations to continue antibiotics.  - Initially started on empiric Vancomycin and Zosyn.  - His cultures remained negative; therefore he was empirically switched to oral Levaquin -Blood cultures negative to date  - Urine culture negative to date - Plan send patient home with Levaquin 750 mg by mouth every 48 hours x 6 doses -Lactic acid level 1.0 and procalcitonin level 1.2 on admission.  -Status post left JJ stent 05/05/12 with internalization of left nephrostomy tube at that time  - Patient was instructed to call his urologist or primary care provider immediately or report to the emergency department if he starts having any temperatures to 100.73F Active  Problems:  Diarrhea  -Check stools for C. Diff PCR-negative-- - Diarrhea improved Hyperlipidemia  -Continue pitavastatin at home Hypertension  -Continue Norvasc. Hold hydrochlorothiazide due to his CKD stage III -Blood pressures well controlled  off of HCTZ CKD (chronic kidney disease) stage 3, GFR 30-59 ml/min  -Monitor creatinine. Continue IV fluids.  - The patient was given intravenous fluids with improvement of his serum creatinine - Initial serum creatinine was 1.76. His serum creatinine was 1.50 on the day of discharge Hypokalemia  -Place on routine potassium supplementation.  -Check magnesium- 1.9 Hyponatremia  -Likely a combination of HCTZ and volume depletion  -Mild. Monitor. - Improved with IV fluids and discontinuation of HCTZ   Discharge Condition: stable  Disposition: home  Diet:heart healthy Wt Readings from Last 3 Encounters:  05/13/12 95.4 kg (210 lb 5.1 oz)  05/05/12 98.431 kg (217 lb)  03/23/12 98.431 kg (217 lb)    History of present illness:  72 year old gentleman with past medical history of HTN, Dyslipidemia and BPH, recent left hydronephrosis and status post successful left percutaneous nephrostomy on 04/20/2012, had hematuria and then underwent left ureteral stent placement 05/05/2012.  Patient was in [facility] but was transferred to [facility] as urology will follow up once patient in [facility]. Patient now presents with fever and left flank pain started on the morning of admission.. Fever at home 104 F. No reports of hematuria, no nausea or vomiting. No chest pain, no shortness of breath, no palpitations. No cough, no lightheadedness or loss of consciousness.    Consultants: Urology, Dr. Brunilda Payor  Discharge Exam: Filed Vitals:   05/13/12 1340  BP: 137/77  Pulse: 76  Temp:  98 F (36.7 C)  Resp: 20   Filed Vitals:   05/12/12 2053 05/13/12 0517 05/13/12 0955 05/13/12 1340  BP: 152/82 159/74 143/73 137/77  Pulse: 79 74  76  Temp: 98.6 F (37 C)  98.5 F (36.9 C)  98 F (36.7 C)  TempSrc: Oral Oral  Oral  Resp: 18 16  20   Height:      Weight:  95.4 kg (210 lb 5.1 oz)    SpO2: 100% 98%  98%   General: A&O x 3, NAD, pleasant, cooperative Cardiovascular: RRR, no rub, no gallop, no S3 Respiratory: CTAB, no wheeze, no rhonchi Abdomen:soft, nontender, nondistended, positive bowel sounds Extremities: No edema, No lymphangitis, no petechiae  Discharge Instructions  Discharge Orders   Future Orders Complete By Expires     Diet - low sodium heart healthy  As directed     Discharge instructions  As directed     Comments:      Stop hydrochlorothiazide.  See your primary care physician and discuss with him/her if you need any other antihypertensive medications    Increase activity slowly  As directed         Medication List    STOP taking these medications       hydrochlorothiazide 25 MG tablet  Commonly known as:  HYDRODIURIL      TAKE these medications       acetaminophen 500 MG tablet  Commonly known as:  TYLENOL  Take 1,000 mg by mouth every 6 (six) hours as needed. For pain     amLODipine 5 MG tablet  Commonly known as:  NORVASC  Take 5 mg by mouth every morning.     levofloxacin 750 MG tablet  Commonly known as:  LEVAQUIN  Take 1 tablet (750 mg total) by mouth every other day.     LIVALO 2 MG Tabs  Generic drug:  Pitavastatin Calcium  Take 2 mg by mouth daily.     omega-3 acid ethyl esters 1 G capsule  Commonly known as:  LOVAZA  Take 2 g by mouth daily.         The results of significant diagnostics from this hospitalization (including imaging, microbiology, ancillary and laboratory) are listed below for reference.    Significant Diagnostic Studies: Ct Abdomen Pelvis Wo Contrast  05/12/2012  *RADIOLOGY REPORT*  Clinical Data: The patient with recent conversion of a left nephrostomy to a left ureteral stents.  Still having fevers, but views are lower.  CT is to rule out abscess.  CT ABDOMEN AND PELVIS  WITHOUT CONTRAST  Technique:  Multidetector CT imaging of the abdomen and pelvis was performed following the standard protocol without intravenous contrast.  Comparison: None.  Findings: Minimal lung base subsegmental atelectasis.  Small hiatal hernia.  The heart is normal in size.  A ureteral stent extends from the left renal pelvis to the bladder. There is mild to moderate left renal collecting system dilation that persists with a small bubble of nondependent air in the left mid pole calix.  This perinephric stranding is noted, but there is no perinephric collection to suggest an abscess.  Mildly prominent left periaortic, perirenal lymph nodes is seen, the largest measuring 1 cm short axis.  The prostate gland is enlarged and there is a lobulated extension of the prostate that bulges into the posterior inferior bladder base.  The bladder is only mildly distended.  The wall is thickened suggesting bladder outlet obstruction.  Normal right kidney and ureter.  There is  fatty infiltration of the liver.  Normal spleen, gallbladder pancreas.  No bile duct dilation.  No adrenal masses. The bowel is unremarkable.  A normal appendix is not visualized.  There is a well-positioned right hip prosthesis.  Degenerative changes are noted throughout the visualized spine and left hip.  IMPRESSION: No evidence of an abscess.  Left ureteral stent as well positioned.  There is still mild to moderate left hydronephrosis.  Marked enlargement of the prostate gland. Prostate either bulges into or invades the posterior inferior bladder base with associated bladder wall thickening suggesting bladder outlet obstruction.  Mild left periaortic adenopathy, nonspecific but likely reactive.   Original Report Authenticated By: Amie Portland, M.D.    Ir Nephrostogram Left  05/05/2012  *RADIOLOGY REPORT*  Clinical Data: Left sided hydronephrosis  1.  LEFT SIDED NEPHROSTOGRAM. 2.  LEFT SIDED URETERAL STENT PLACEMENT  Comparison:  Left-sided  percutaneous nephrostomy catheter placement - 02/18/2013  Intravenous medications:  Versed 4 mg IV; Fentanyl 200 mcg IV; Ciprofloxacin 400 mg IV the antibiotics were administered within 1 hour of the procedure  Total Moderate Sedation Time: 25 minutes.  Contrast:  50 ml Omnipaque-300; administered into the urinary system  Fluoroscopy Time: 8.5 minutes.  Complications:  None immediate  Procedure:  The procedure, risks, benefits, and alternatives were explained to the patient.  Questions regarding the procedure were encouraged and answered.  The patient understands and consents to the procedure.  The nephrostomy tubes and surrounding skin were prepped with Betadine in a sterile fashion, and a sterile drape was applied covering the operative field.  A sterile gown and sterile gloves were used for the procedure. Local anesthesia was provided with 1% Lidocaine.  The preexisting left-sided nephrostomy tube was injected with contrast material.  Fluoroscopic spot images were obtained of the collecting system and ureter to the level of the urinary bladder. The existing nephrostomy catheter was cut and cannulated with a Benson wire.  Under intermittent fluoroscopic guidance, the existing nephrostomy catheter was exchanged for a 9-French vascular sheath which was advanced into the renal pelvis and utilized to advance an additional Benson wire into the renal collecting system to be used as a Museum/gallery conservator.  The vascular sheath was exchanged for a Kumpe catheter which was manipulated to the level of the urinary bladder with the use of a stiff Glidewire.  The Kumpe catheter was utilized to estimate to appropriate length of the ureteral stent.  An 8-French, 28 cm ureteral stent was then advanced over an Amplatz superstiff guidewire.  The distal portion was formed at the level of the bladder.  Proximal portion was then formed at the level of the renal collecting system.  Contrast injection was performed demonstrating brisk passage of  contrast to the double-J ureteral stent and into the urinary bladder. As such, the percutaneous access site was abandoned. A dressing was placed.  The patient tolerated procedure well without immediate postprocedural complication.  Findings:  Nephrostogram demonstrates appropriate positioning of the existing left-sided percutaneous nephrostomy catheter with resolution of previously noted marked hydronephrosis. There are no discrete irregular filling defects within the left ureter.  A left-sided ureteral stent was able to be placed spanning from the left renal collecting system to the bladder. Contrast injection confirmed appropriate positioning of functioning of the ureteral stent as such, percutaneous access was abandoned.  IMPRESSION:  Successful conversion of existing left sided nephrostomy catheter to a 28 cm ureteral stent.   Original Report Authenticated By: Tacey Ruiz, MD    Ir  Perc Nephrostomy Left  04/20/2012  *RADIOLOGY REPORT*  Indication: History of enlarged prostate, now with left sided hydronephrosis, post failed attempted retrograde ureteral stent placement  1.  ULTRASOUND GUIDANCE FOR PUNCTURE OF THE LEFT RENAL COLLECTING SYSTEM. 2.  LEFT PERCUTANEOUS NEPHROSTOMY TUBE PLACEMENT  Comparison: Renal ultrasound - 04/15/2012; CT abdomen pelvis - 02/07/2012 [facility] examination  Sedation: Fentanyl 150 mcg IV; Versed 4 mg IV; Ciprofloxacin 400 mg IV;  The antibiotic was administered in an appropriate time frame prior to skin puncture.  Contrast: 25 Omnipaque-300 in addition into the collecting system  Total Moderate Sedation Time: 40 minutes.  Fluoroscopy Time: 8.5 minutes.  Complications: None immediate  Procedure:  The procedure, risks, benefits, and alternatives were explained to the patient.  Questions regarding the procedure were encouraged and answered.  The patient understands and consents to the procedure. A timeout was performed prior to the initiation of the procedure.   The left flank region was prepped with Betadine in a sterile fashion, and a sterile drape was applied covering the operative field.  A sterile gown and sterile gloves were used for the procedure. Local anesthesia was provided with 1% Lidocaine with epinephrine.  Ultrasound was used to localize the  kidney.  Under direct ultrasound guidance, a 21 gauge needle was advanced into the renal collecting system.  An ultrasound image documentation was performed.  Access within the collecting system was confirmed with the efflux of urine followed by contrast injection.  Over a Nitrex wire, the tract was dilated with an Accustick stent. Over a Benson wire, the track was dilated and a 10 French nephrostomy catheter was attempted to be advanced into the collecting system, however as there was some resistance, the nephrostomy was exchanged for a Kumpe catheter.  Now over an Amplatz stiff guide wire, a 10 French nephrostomy catheter was advanced into the left renal pelvis, coiled and locked.  Contrast was injected and several sport radiographs were obtained in various obliquities confirming access.  The catheter was secured at the skin with a Prolene retention suture and a gravity bag was placed.  A dressing was placed.  The patient tolerated procedure well without immediate postprocedural complication.  Findings:  Ultrasound scanning demonstrates a severely dilated left-sided renal collecting system, no outside abdominal CT.  Under ultrasound guidance, a posterior inferior calix was targeted ultimately allowing placement of an 10-French percutaneous nephrostomy catheter under intermittent fluoroscopic guidance. Contrast injection confirmed appropriate positioning.  A small amount of postprocedural blood was noted within the collecting system. Incidental note is made of partial duplication of the left renal collecting system.  IMPRESSION:  Successful ultrasound and fluoroscopic guided placement of a left sided 10 French PCN.   Plan:  The patient may return at a later date for formal antegrade nephrostogram and potential internalization of the nephrostomy catheter.   Original Report Authenticated By: Tacey Ruiz, MD    Ir US Guide Bx Asp/drain  04/20/2012  *RADIOLOGY REPORT*  Indication: History of enlarged prostate, now with left sided hydronephrosis, post failed attempted retrograde ureteral stent placement  1.  ULTRASOUND GUIDANCE FOR PUNCTURE OF THE LEFT RENAL COLLECTING SYSTEM. 2.  LEFT PERCUTANEOUS NEPHROSTOMY TUBE PLACEMENT  Comparison: Renal ultrasound - 04/15/2012; CT abdomen pelvis - 02/07/2012 [facility] examination  Sedation: Fentanyl 150 mcg IV; Versed 4 mg IV; Ciprofloxacin 400 mg IV;  The antibiotic was administered in an appropriate time frame prior to skin puncture.  Contrast: 25 Omnipaque-300 in addition into the collecting system  Total  Moderate Sedation Time: 40 minutes.  Fluoroscopy Time: 8.5 minutes.  Complications: None immediate  Procedure:  The procedure, risks, benefits, and alternatives were explained to the patient.  Questions regarding the procedure were encouraged and answered.  The patient understands and consents to the procedure. A timeout was performed prior to the initiation of the procedure.  The left flank region was prepped with Betadine in a sterile fashion, and a sterile drape was applied covering the operative field.  A sterile gown and sterile gloves were used for the procedure. Local anesthesia was provided with 1% Lidocaine with epinephrine.  Ultrasound was used to localize the  kidney.  Under direct ultrasound guidance, a 21 gauge needle was advanced into the renal collecting system.  An ultrasound image documentation was performed.  Access within the collecting system was confirmed with the efflux of urine followed by contrast injection.  Over a Nitrex wire, the tract was dilated with an Accustick stent. Over a Benson wire, the track was dilated and a 10 French nephrostomy  catheter was attempted to be advanced into the collecting system, however as there was some resistance, the nephrostomy was exchanged for a Kumpe catheter.  Now over an Amplatz stiff guide wire, a 10 French nephrostomy catheter was advanced into the left renal pelvis, coiled and locked.  Contrast was injected and several sport radiographs were obtained in various obliquities confirming access.  The catheter was secured at the skin with a Prolene retention suture and a gravity bag was placed.  A dressing was placed.  The patient tolerated procedure well without immediate postprocedural complication.  Findings:  Ultrasound scanning demonstrates a severely dilated left-sided renal collecting system, no outside abdominal CT.  Under ultrasound guidance, a posterior inferior calix was targeted ultimately allowing placement of an 10-French percutaneous nephrostomy catheter under intermittent fluoroscopic guidance. Contrast injection confirmed appropriate positioning.  A small amount of postprocedural blood was noted within the collecting system. Incidental note is made of partial duplication of the left renal collecting system.  IMPRESSION:  Successful ultrasound and fluoroscopic guided placement of a left sided 10 French PCN.  Plan:  The patient may return at a later date for formal antegrade nephrostogram and potential internalization of the nephrostomy catheter.   Original Report Authenticated By: Tacey Ruiz, MD    Dg Chest Port 1 View  05/09/2012  *RADIOLOGY REPORT*  Clinical Data: Fever, weakness  PORTABLE CHEST - 1 VIEW  Comparison: Prior chest x-ray 01/22/2011  Findings: Very low inspiratory volumes with patchy bibasilar opacities.  Low inspiratory volumes exaggerates the cardial pericardial silhouette result in crowding of the pulmonary vasculature.  The heart size is likely within normal limits.  No pleural effusion or pneumothorax.  No acute osseous abnormality.  IMPRESSION: Very low inspiratory volumes with  bibasilar opacities which may reflect atelectasis or early infiltrate.  Dedicated PA and lateral chest x-ray may be helpful if clinically warranted.   Original Report Authenticated By: Malachy Moan, M.D.    Ir [place] Abts Cath Perc Left  05/05/2012  *RADIOLOGY REPORT*  Clinical Data: Left sided hydronephrosis  1.  LEFT SIDED NEPHROSTOGRAM. 2.  LEFT SIDED URETERAL STENT PLACEMENT  Comparison:  Left-sided percutaneous nephrostomy catheter placement - 02/18/2013  Intravenous medications:  Versed 4 mg IV; Fentanyl 200 mcg IV; Ciprofloxacin 400 mg IV the antibiotics were administered within 1 hour of the procedure  Total Moderate Sedation Time: 25 minutes.  Contrast:  50 ml Omnipaque-300; administered into the urinary system  Fluoroscopy Time: 8.5 minutes.  Complications:  None immediate  Procedure:  The procedure, risks, benefits, and alternatives were explained to the patient.  Questions regarding the procedure were encouraged and answered.  The patient understands and consents to the procedure.  The nephrostomy tubes and surrounding skin were prepped with Betadine in a sterile fashion, and a sterile drape was applied covering the operative field.  A sterile gown and sterile gloves were used for the procedure. Local anesthesia was provided with 1% Lidocaine.  The preexisting left-sided nephrostomy tube was injected with contrast material.  Fluoroscopic spot images were obtained of the collecting system and ureter to the level of the urinary bladder. The existing nephrostomy catheter was cut and cannulated with a Benson wire.  Under intermittent fluoroscopic guidance, the existing nephrostomy catheter was exchanged for a 9-French vascular sheath which was advanced into the renal pelvis and utilized to advance an additional Benson wire into the renal collecting system to be used as a Museum/gallery conservator.  The vascular sheath was exchanged for a Kumpe catheter which was manipulated to the level of the urinary bladder with  the use of a stiff Glidewire.  The Kumpe catheter was utilized to estimate to appropriate length of the ureteral stent.  An 8-French, 28 cm ureteral stent was then advanced over an Amplatz superstiff guidewire.  The distal portion was formed at the level of the bladder.  Proximal portion was then formed at the level of the renal collecting system.  Contrast injection was performed demonstrating brisk passage of contrast to the double-J ureteral stent and into the urinary bladder. As such, the percutaneous access site was abandoned. A dressing was placed.  The patient tolerated procedure well without immediate postprocedural complication.  Findings:  Nephrostogram demonstrates appropriate positioning of the existing left-sided percutaneous nephrostomy catheter with resolution of previously noted marked hydronephrosis. There are no discrete irregular filling defects within the left ureter.  A left-sided ureteral stent was able to be placed spanning from the left renal collecting system to the bladder. Contrast injection confirmed appropriate positioning of functioning of the ureteral stent as such, percutaneous access was abandoned.  IMPRESSION:  Successful conversion of existing left sided nephrostomy catheter to a 28 cm ureteral stent.   Original Report Authenticated By: Tacey Ruiz, MD      Microbiology: Recent Results (from the past 240 hour(s))  CULTURE, BLOOD (ROUTINE X 2)     Status: None   Collection Time    05/10/12  5:05 AM      Result Value Range Status   Specimen Description BLOOD RIGHT ANTECUBITAL   Final   Special Requests BOTTLES DRAWN AEROBIC AND ANAEROBIC   Final   Culture  Setup Time 05/10/2012 08:26   Final   Culture     Final   Value:        BLOOD CULTURE RECEIVED NO GROWTH TO DATE CULTURE WILL BE HELD FOR 5 DAYS BEFORE ISSUING A FINAL NEGATIVE REPORT   Report Status PENDING   Incomplete  CULTURE, BLOOD (ROUTINE X 2)     Status: None   Collection Time    05/10/12  5:09 AM       Result Value Range Status   Specimen Description BLOOD RIGHT HAND   Final   Special Requests BOTTLES DRAWN AEROBIC AND ANAEROBIC   Final   Culture  Setup Time 05/10/2012 08:26   Final   Culture     Final   Value:        BLOOD CULTURE RECEIVED  NO GROWTH TO DATE CULTURE WILL BE HELD FOR 5 DAYS BEFORE ISSUING A FINAL NEGATIVE REPORT   Report Status PENDING   Incomplete  URINE CULTURE     Status: None   Collection Time    05/10/12 10:55 AM      Result Value Range Status   Specimen Description URINE, CLEAN CATCH   Final   Special Requests NONE   Final   Culture  Setup Time 05/11/2012 01:46   Final   Colony Count NO GROWTH   Final   Culture NO GROWTH   Final   Report Status 05/11/2012 FINAL   Final  CLOSTRIDIUM DIFFICILE BY PCR     Status: None   Collection Time    05/11/12 11:26 AM      Result Value Range Status   C difficile by pcr NEGATIVE  NEGATIVE Final     Labs: Basic Metabolic Panel:  Recent Labs Lab 05/09/12 2000 05/10/12 0509 05/11/12 0446 05/12/12 1700 05/13/12 0448  NA 132* 132* 135  --  141  K 3.3* 3.4* 3.4*  --  4.0  CL 99 100 99  --  107  CO2 24 22 22   --  23  GLUCOSE 117* 109* 104*  --  95  BUN 23 21 18   --  14  CREATININE 1.76* 1.67* 1.71* 1.43* 1.50*  CALCIUM 8.6 8.7 9.0  --  8.8  MG 1.8  --   --   --  1.9  PHOS 2.9  --   --   --   --    Liver Function Tests:  Recent Labs Lab 05/09/12 2000 05/10/12 0509  AST 31 31  ALT 31 34  ALKPHOS 62 62  BILITOT 0.7 0.5  PROT 7.4 7.2  ALBUMIN 3.1* 3.0*   No results found for this basename: LIPASE, AMYLASE,  in the last 168 hours No results found for this basename: AMMONIA,  in the last 168 hours CBC:  Recent Labs Lab 05/09/12 2000 05/10/12 0509 05/11/12 0446 05/13/12 0448  WBC 14.4* 11.8* 11.0* 6.9  NEUTROABS 11.5*  --   --   --   HGB 12.1* 11.3* 12.6* 11.7*  HCT 34.7* 33.8* 37.1* 35.0*  MCV 95.1 95.2 96.4 96.2  PLT 291 264 319 357   Cardiac Enzymes: No results found for this basename:  CKTOTAL, CKMB, CKMBINDEX, TROPONINI,  in the last 168 hours BNP: No components found with this basename: POCBNP,  CBG:  Recent Labs Lab 05/10/12 0752 05/10/12 1201 05/10/12 1335 05/10/12 1652  GLUCAP 108* 124* 103* 100*    Time coordinating discharge:  Greater than 30 minutes  Signed:  Bosten Newstrom, DO [facility] Pager: [phone] 05/13/2012, 2:59 PM

## 2012-05-13 NOTE — Progress Notes (Signed)
PT Cancellation Note  ___Treatment cancelled today due to medical issues with patient which prohibited therapy  ___ Treatment cancelled today due to patient receiving procedure or test   ___ Treatment cancelled today due to patient's refusal to participate   _X_ Pt declined stating "I am going home today and can't wait to take a real shower"   Felecia Shelling  PTA [facility]  Acute  Rehab Pager      [phone]

## 2012-05-13 NOTE — Progress Notes (Signed)
Subjective: Patient reports Feels better.  No more diarrhea.  Objective: Vital signs in last 24 hours: Temp:  [98.5 F (36.9 C)-99.5 F (37.5 C)] 98.5 F (36.9 C) (02/20 0517) Pulse Rate:  [73-79] 74 (02/20 0517) Resp:  [16-19] 16 (02/20 0517) BP: (130-159)/(72-82) 159/74 mmHg (02/20 0517) SpO2:  [98 %-100 %] 98 % (02/20 0517) Weight:  [210 lb 5.1 oz (95.4 kg)] 210 lb 5.1 oz (95.4 kg) (02/20 0517)  Intake/Output from previous day: 02/19 0701 - 02/20 0700 In: 2191.3 [I.V.:1801.3; IV Piggyback:150] Out: 953 [Urine:950; Stool:3] Intake/Output this shift:    Physical Exam:  Alert and oriented Lungs - Normal respiratory effort, chest expands symmetrically.  Abdomen - Soft, non-tender & non-distended WBC: 6.9 Creat: 1.50 CT scan: no renal or perinephric abscess Lab Results:  Recent Labs  05/11/12 0446 05/13/12 0448  HGB 12.6* 11.7*  HCT 37.1* 35.0*   BMET  Recent Labs  05/11/12 0446 05/12/12 1700 05/13/12 0448  NA 135  --  141  K 3.4*  --  4.0  CL 99  --  107  CO2 22  --  23  GLUCOSE 104*  --  95  BUN 18  --  14  CREATININE 1.71* 1.43* 1.50*  CALCIUM 9.0  --  8.8   No results found for this basename: LABPT, INR,  in the last 72 hours No results found for this basename: LABURIN,  in the last 72 hours Results for orders placed during the hospital encounter of 05/09/12  CULTURE, BLOOD (ROUTINE X 2)     Status: None   Collection Time    05/10/12  5:05 AM      Result Value Range Status   Specimen Description BLOOD RIGHT ANTECUBITAL   Final   Special Requests BOTTLES DRAWN AEROBIC AND ANAEROBIC   Final   Culture  Setup Time 05/10/2012 08:26   Final   Culture     Final   Value:        BLOOD CULTURE RECEIVED NO GROWTH TO DATE CULTURE WILL BE HELD FOR 5 DAYS BEFORE ISSUING A FINAL NEGATIVE REPORT   Report Status PENDING   Incomplete  CULTURE, BLOOD (ROUTINE X 2)     Status: None   Collection Time    05/10/12  5:09 AM      Result Value Range Status    Specimen Description BLOOD RIGHT HAND   Final   Special Requests BOTTLES DRAWN AEROBIC AND ANAEROBIC   Final   Culture  Setup Time 05/10/2012 08:26   Final   Culture     Final   Value:        BLOOD CULTURE RECEIVED NO GROWTH TO DATE CULTURE WILL BE HELD FOR 5 DAYS BEFORE ISSUING A FINAL NEGATIVE REPORT   Report Status PENDING   Incomplete  URINE CULTURE     Status: None   Collection Time    05/10/12 10:55 AM      Result Value Range Status   Specimen Description URINE, CLEAN CATCH   Final   Special Requests NONE   Final   Culture  Setup Time 05/11/2012 01:46   Final   Colony Count NO GROWTH   Final   Culture NO GROWTH   Final   Report Status 05/11/2012 FINAL   Final  CLOSTRIDIUM DIFFICILE BY PCR     Status: None   Collection Time    05/11/12 11:26 AM      Result Value Range Status   C difficile by  pcr NEGATIVE  NEGATIVE Final    Studies/Results: Ct Abdomen Pelvis Wo Contrast  05/12/2012  *RADIOLOGY REPORT*  Clinical Data: The patient with recent conversion of a left nephrostomy to a left ureteral stents.  Still having fevers, but views are lower.  CT is to rule out abscess.  CT ABDOMEN AND PELVIS WITHOUT CONTRAST  Technique:  Multidetector CT imaging of the abdomen and pelvis was performed following the standard protocol without intravenous contrast.  Comparison: None.  Findings: Minimal lung base subsegmental atelectasis.  Small hiatal hernia.  The heart is normal in size.  A ureteral stent extends from the left renal pelvis to the bladder. There is mild to moderate left renal collecting system dilation that persists with a small bubble of nondependent air in the left mid pole calix.  This perinephric stranding is noted, but there is no perinephric collection to suggest an abscess.  Mildly prominent left periaortic, perirenal lymph nodes is seen, the largest measuring 1 cm short axis.  The prostate gland is enlarged and there is a lobulated extension of the prostate that bulges into the  posterior inferior bladder base.  The bladder is only mildly distended.  The wall is thickened suggesting bladder outlet obstruction.  Normal right kidney and ureter.  There is fatty infiltration of the liver.  Normal spleen, gallbladder pancreas.  No bile duct dilation.  No adrenal masses. The bowel is unremarkable.  A normal appendix is not visualized.  There is a well-positioned right hip prosthesis.  Degenerative changes are noted throughout the visualized spine and left hip.  IMPRESSION: No evidence of an abscess.  Left ureteral stent as well positioned.  There is still mild to moderate left hydronephrosis.  Marked enlargement of the prostate gland. Prostate either bulges into or invades the posterior inferior bladder base with associated bladder wall thickening suggesting bladder outlet obstruction.  Mild left periaortic adenopathy, nonspecific but likely reactive.   Original Report Authenticated By: Amie Portland, M.D.     Assessment/Plan:  Left hydronephrosis.  BPH.   Can be discharged urologically on oral antibiotics.  Will follow as outpatient   LOS: 4 days   Albert Hicks 05/13/2012, 8:40 AM

## 2012-05-16 LAB — CULTURE, BLOOD (ROUTINE X 2)
Culture: NO GROWTH
Culture: NO GROWTH

## 2012-06-14 ENCOUNTER — Ambulatory Visit (INDEPENDENT_AMBULATORY_CARE_PROVIDER_SITE_OTHER): Payer: Medicare Other | Admitting: Physician Assistant

## 2012-06-14 ENCOUNTER — Encounter: Payer: Self-pay | Admitting: Physician Assistant

## 2012-06-14 VITALS — BP 143/70 | HR 107 | Wt 207.0 lb

## 2012-06-14 DIAGNOSIS — I10 Essential (primary) hypertension: Secondary | ICD-10-CM

## 2012-06-14 DIAGNOSIS — M109 Gout, unspecified: Secondary | ICD-10-CM | POA: Diagnosis not present

## 2012-06-14 DIAGNOSIS — Z8739 Personal history of other diseases of the musculoskeletal system and connective tissue: Secondary | ICD-10-CM | POA: Insufficient documentation

## 2012-06-14 MED ORDER — PREDNISONE 10 MG PO TABS: ORAL_TABLET | ORAL | Status: DC

## 2012-06-14 NOTE — Patient Instructions (Addendum)
Gout Gout is an inflammatory condition (arthritis) caused by a buildup of uric acid crystals in the joints. Uric acid is a chemical that is normally present in the blood. Under some circumstances, uric acid can form into crystals in your joints. This causes joint redness, soreness, and swelling (inflammation). Repeat attacks are common. Over time, uric acid crystals can form into masses (tophi) near a joint, causing disfigurement. Gout is treatable and often preventable. CAUSES  The disease begins with elevated levels of uric acid in the blood. Uric acid is produced by your body when it breaks down a naturally found substance called purines. This also happens when you eat certain foods such as meats and fish. Causes of an elevated uric acid level include:  Being passed down from parent to child (heredity).  Diseases that cause increased uric acid production (obesity, psoriasis, some cancers).  Excessive alcohol use.  Diet, especially diets rich in meat and seafood.  Medicines, including certain cancer-fighting drugs (chemotherapy), diuretics, and aspirin.  Chronic kidney disease. The kidneys are no longer able to remove uric acid well.  Problems with metabolism. Conditions strongly associated with gout include:  Obesity.  High blood pressure.  High cholesterol.  Diabetes. Not everyone with elevated uric acid levels gets gout. It is not understood why some people get gout and others do not. Surgery, joint injury, and eating too much of certain foods are some of the factors that can lead to gout. SYMPTOMS   An attack of gout comes on quickly. It causes intense pain with redness, swelling, and warmth in a joint.  Fever can occur.  Often, only one joint is involved. Certain joints are more commonly involved:  Base of the big toe.  Knee.  Ankle.  Wrist.  Finger. Without treatment, an attack usually goes away in a few days to weeks. Between attacks, you usually will not have  symptoms, which is different from many other forms of arthritis. DIAGNOSIS  Your caregiver will suspect gout based on your symptoms and exam. Removal of fluid from the joint (arthrocentesis) is done to check for uric acid crystals. Your caregiver will give you a medicine that numbs the area (local anesthetic) and use a needle to remove joint fluid for exam. Gout is confirmed when uric acid crystals are seen in joint fluid, using a special microscope. Sometimes, blood, urine, and X-ray tests are also used. TREATMENT  There are 2 phases to gout treatment: treating the sudden onset (acute) attack and preventing attacks (prophylaxis). Treatment of an Acute Attack  Medicines are used. These include anti-inflammatory medicines or steroid medicines.  An injection of steroid medicine into the affected joint is sometimes necessary.  The painful joint is rested. Movement can worsen the arthritis.  You may use warm or cold treatments on painful joints, depending which works best for you.  Discuss the use of coffee, vitamin C, or cherries with your caregiver. These may be helpful treatment options. Treatment to Prevent Attacks After the acute attack subsides, your caregiver may advise prophylactic medicine. These medicines either help your kidneys eliminate uric acid from your body or decrease your uric acid production. You may need to stay on these medicines for a very long time. The early phase of treatment with prophylactic medicine can be associated with an increase in acute gout attacks. For this reason, during the first few months of treatment, your caregiver may also advise you to take medicines usually used for acute gout treatment. Be sure you understand your caregiver's directions.   You should also discuss dietary treatment with your caregiver. Certain foods such as meats and fish can increase uric acid levels. Other foods such as dairy can decrease levels. Your caregiver can give you a list of foods  to avoid. HOME CARE INSTRUCTIONS   Do not take aspirin to relieve pain. This raises uric acid levels.  Only take over-the-counter or prescription medicines for pain, discomfort, or fever as directed by your caregiver.  Rest the joint as much as possible. When in bed, keep sheets and blankets off painful areas.  Keep the affected joint raised (elevated).  Use crutches if the painful joint is in your leg.  Drink enough water and fluids to keep your urine clear or pale yellow. This helps your body get rid of uric acid. Do not drink alcoholic beverages. They slow the passage of uric acid.  Follow your caregiver's dietary instructions. Pay careful attention to the amount of protein you eat. Your daily diet should emphasize fruits, vegetables, whole grains, and fat-free or low-fat milk products.  Maintain a healthy body weight. SEEK MEDICAL CARE IF:   You have an oral temperature above 102 F (38.9 C).  You develop diarrhea, vomiting, or any side effects from medicines.  You do not feel better in 24 hours, or you are getting worse. SEEK IMMEDIATE MEDICAL CARE IF:   Your joint becomes suddenly more tender and you have:  Chills.  An oral temperature above 102 F (38.9 C), not controlled by medicine. MAKE SURE YOU:   Understand these instructions.  Will watch your condition.  Will get help right away if you are not doing well or get worse. Document Released: 03/07/2000 Document Revised: 06/02/2011 Document Reviewed: 06/18/2009 [employer] Patient Information 2013 [employer].    

## 2012-06-14 NOTE — Progress Notes (Signed)
  Subjective:    Patient ID: Albert Hicks, male    DOB: Jun 11, 1940, 72 y.o.   MRN: [id number]  HPI Patient is a 72 yo male who presents with a gout flare. He has had multiple gout flares in the past but never put on prevenative treatment. He does have CKD and recently had a stent put in urethra. He is being followed by urologist and nephrologist. Pain is located in his left medial malleous. This has been going on for 4 days. Denies any trauma. It has actually gotten better in last day. At first he was not able to walk on left foot. He admits to having alcohol for the last week every night. He has stopped since left ankle started to hurt. Pain was 9/10 but has decreased to 7/10. He admits to taking 2 ibuprofens even after not supposed to because of kidneys. Ibuprofen did help. Reports tylenol does nothing. Denies any fever, chills. Ankle is very painful to touch.   HTN- uncontrolled since above 140. Denies any diet changes. Denies any CP, palpitations, SOB, HA's. Nothing makes better or worse. Does not monitor at home.   Review of Systems     Objective:   Physical Exam  Constitutional: He is oriented to person, place, and time. He appears well-developed and well-nourished.  HENT:  Head: Normocephalic and atraumatic.  Cardiovascular: Normal rate, regular rhythm and normal heart sounds.   Pulmonary/Chest: Effort normal and breath sounds normal.  Musculoskeletal:  Left medial malleous: red, swollen, painful to touch. ROM decreased due to pain.   Neurological: He is alert and oriented to person, place, and time.  Skin:  Erythematous over left medial ankle with swelling. Warm to touch.  Psychiatric: He has a normal mood and affect. His behavior is normal.            Assessment & Plan:  Gout flare- Given taper of prednisone for flare because of kidney disease and not the best candidate for colchine or NSAIDS. He could have colchine after looking up creatine clearance above 30 no adjustments.  Will see if prednisone works. Gave handout for gout. Discussed ice/heat. Discussed diet. Will get uric acid level. Reminded not to take NSAIDs but can take tylenol for break through pain. Call with any worsening of symptoms.   HTN- BP a little elevated. Will keep meds the same. Likely elevated due to pain. Will continue to monitor. Discussed with pt guidelines are getting more lenient with BP readings.

## 2012-06-15 LAB — URIC ACID: Uric Acid, Serum: 7 mg/dL (ref 4.0–7.8)

## 2012-07-01 ENCOUNTER — Encounter: Payer: Self-pay | Admitting: Physician Assistant

## 2012-07-01 ENCOUNTER — Ambulatory Visit (INDEPENDENT_AMBULATORY_CARE_PROVIDER_SITE_OTHER): Payer: Medicare Other | Admitting: Physician Assistant

## 2012-07-01 VITALS — BP 140/69 | HR 112 | Ht 73.0 in | Wt 198.0 lb

## 2012-07-01 DIAGNOSIS — N12 Tubulo-interstitial nephritis, not specified as acute or chronic: Secondary | ICD-10-CM | POA: Diagnosis not present

## 2012-07-01 DIAGNOSIS — R3915 Urgency of urination: Secondary | ICD-10-CM

## 2012-07-01 DIAGNOSIS — N401 Enlarged prostate with lower urinary tract symptoms: Secondary | ICD-10-CM

## 2012-07-01 DIAGNOSIS — R300 Dysuria: Secondary | ICD-10-CM | POA: Diagnosis not present

## 2012-07-01 DIAGNOSIS — R319 Hematuria, unspecified: Secondary | ICD-10-CM

## 2012-07-01 DIAGNOSIS — N138 Other obstructive and reflux uropathy: Secondary | ICD-10-CM

## 2012-07-01 DIAGNOSIS — N139 Obstructive and reflux uropathy, unspecified: Secondary | ICD-10-CM

## 2012-07-01 LAB — POCT URINALYSIS DIPSTICK
Bilirubin, UA: NEGATIVE
Glucose, UA: NEGATIVE
Ketones, UA: 40
Nitrite, UA: NEGATIVE
Protein, UA: >=300
Spec Grav, UA: >=1.03
Urobilinogen, UA: 0.2
pH, UA: 7

## 2012-07-01 MED ORDER — CIPROFLOXACIN HCL 500 MG PO TABS: 500.0000 mg | ORAL_TABLET | Freq: Two times a day (BID) | ORAL | Status: DC

## 2012-07-01 NOTE — Patient Instructions (Addendum)
SAM-E 200mg  to 1200mg  for pain of joints.   Drink lots of water.   Urinary Tract Infection Urinary tract infections (UTIs) can develop anywhere along your urinary tract. Your urinary tract is your body's drainage system for removing wastes and extra water. Your urinary tract includes two kidneys, two ureters, a bladder, and a urethra. Your kidneys are a pair of bean-shaped organs. Each kidney is about the size of your fist. They are located below your ribs, one on each side of your spine. CAUSES Infections are caused by microbes, which are microscopic organisms, including fungi, viruses, and bacteria. These organisms are so small that they can only be seen through a microscope. Bacteria are the microbes that most commonly cause UTIs. SYMPTOMS  Symptoms of UTIs may vary by age and gender of the patient and by the location of the infection. Symptoms in young women typically include a frequent and intense urge to urinate and a painful, burning feeling in the bladder or urethra during urination. Older women and men are more likely to be tired, shaky, and weak and have muscle aches and abdominal pain. A fever may mean the infection is in your kidneys. Other symptoms of a kidney infection include pain in your back or sides below the ribs, nausea, and vomiting. DIAGNOSIS To diagnose a UTI, your caregiver will ask you about your symptoms. Your caregiver also will ask to provide a urine sample. The urine sample will be tested for bacteria and white blood cells. White blood cells are made by your body to help fight infection. TREATMENT  Typically, UTIs can be treated with medication. Because most UTIs are caused by a bacterial infection, they usually can be treated with the use of antibiotics. The choice of antibiotic and length of treatment depend on your symptoms and the type of bacteria causing your infection. HOME CARE INSTRUCTIONS  If you were prescribed antibiotics, take them exactly as your caregiver  instructs you. Finish the medication even if you feel better after you have only taken some of the medication.  Drink enough water and fluids to keep your urine clear or pale yellow.  Avoid caffeine, tea, and carbonated beverages. They tend to irritate your bladder.  Empty your bladder often. Avoid holding urine for long periods of time.  Empty your bladder before and after sexual intercourse.  After a bowel movement, women should cleanse from front to back. Use each tissue only once. SEEK MEDICAL CARE IF:   You have back pain.  You develop a fever.  Your symptoms do not begin to resolve within 3 days. SEEK IMMEDIATE MEDICAL CARE IF:   You have severe back pain or lower abdominal pain.  You develop chills.  You have nausea or vomiting.  You have continued burning or discomfort with urination. MAKE SURE YOU:   Understand these instructions.  Will watch your condition.  Will get help right away if you are not doing well or get worse. Document Released: 12/18/2004 Document Revised: 09/09/2011 Document Reviewed: 04/18/2011 [facility] Patient Information 2013 [place], [place].

## 2012-07-01 NOTE — Progress Notes (Signed)
  Subjective:    Patient ID: Albert Hicks, male    DOB: May 12, 1940, 72 y.o.   MRN: [id number]  HPI Patient presents to the clinic with urinary urgency, frequency, dysuria for the last 4 days. Woke up 2-3 times last night to urinate. Denies any fever. Burning sensation every time he urinates. NO N/V/D. Feels a lot of abdomen pressure. NOrmal bowel movements. No energy. He felt the best after prednisone for gout but once he stopped prednisone felt lousy again. He has not felt right since his  left percutaneous nephrostomy on 04/20/2012. He went to hospital in [place] due to kidney infection and sepis. He has had a lot of weight loss normally 215 but has dropped to 198.   Follow up appt with urologist next week.   Lots of joint pain in bilateral knees and hip. NSAIDs work but can't use. Anything else. Tried glucoamine chondrotin with no relief.      Review of Systems     Objective:   Physical Exam  Constitutional: He is oriented to person, place, and time. He appears well-developed and well-nourished.  HENT:  Head: Normocephalic and atraumatic.  Eyes: Conjunctivae are normal.  Neck: Normal range of motion. Neck supple.  Cardiovascular: Regular rhythm and normal heart sounds.   Tachycardia at 112.   Pulmonary/Chest: Effort normal and breath sounds normal.  No CVA tenderness. Mild tenderness with palpation over left flank approximately where stent should be.  Abdominal: Soft. Bowel sounds are normal. There is no tenderness.  Genitourinary:  Pt wearing depends and stood up in office and urinated. He expressed pain with moaning and groaning.   Neurological: He is alert and oriented to person, place, and time.  Skin: Skin is warm and dry.  Psychiatric: He has a normal mood and affect. His behavior is normal.          Assessment & Plan:  Post nephrostomy/Urinary urgency/dysuria- UA positive for blood and Leukoctyes. Treated for UTI with cipro. Follow up with urologist. Call if not  improving. Per pt felt better with prednisone will check cortisol levels to see if decreased and could be problem with adrenal glands.   Osteoarthritis- Cannot take NSAIDS due to kidney disease. Recommened to try SAM-E.

## 2012-07-02 DIAGNOSIS — N12 Tubulo-interstitial nephritis, not specified as acute or chronic: Secondary | ICD-10-CM | POA: Diagnosis not present

## 2012-07-03 DIAGNOSIS — N401 Enlarged prostate with lower urinary tract symptoms: Secondary | ICD-10-CM | POA: Insufficient documentation

## 2012-07-03 DIAGNOSIS — N138 Other obstructive and reflux uropathy: Secondary | ICD-10-CM | POA: Insufficient documentation

## 2012-07-03 LAB — URINE CULTURE: Colony Count: 8000

## 2012-07-03 LAB — CORTISOL-AM, BLOOD: Cortisol - AM: 21 ug/dL (ref 4.3–22.4)

## 2012-07-08 DIAGNOSIS — N1330 Unspecified hydronephrosis: Secondary | ICD-10-CM | POA: Diagnosis not present

## 2012-07-08 DIAGNOSIS — N401 Enlarged prostate with lower urinary tract symptoms: Secondary | ICD-10-CM | POA: Diagnosis not present

## 2012-07-12 ENCOUNTER — Other Ambulatory Visit: Payer: Self-pay | Admitting: Urology

## 2012-07-20 ENCOUNTER — Encounter (HOSPITAL_COMMUNITY): Payer: Self-pay | Admitting: Pharmacy Technician

## 2012-07-24 ENCOUNTER — Encounter: Payer: Self-pay | Admitting: *Deleted

## 2012-07-24 ENCOUNTER — Emergency Department (INDEPENDENT_AMBULATORY_CARE_PROVIDER_SITE_OTHER)
Admission: EM | Admit: 2012-07-24 | Discharge: 2012-07-24 | Disposition: A | Discharge status: Home / Self Care | Payer: Medicare Other | Source: Home / Self Care | Attending: Family Medicine | Admitting: Family Medicine

## 2012-07-24 DIAGNOSIS — N390 Urinary tract infection, site not specified: Secondary | ICD-10-CM

## 2012-07-24 DIAGNOSIS — R350 Frequency of micturition: Secondary | ICD-10-CM

## 2012-07-24 LAB — POCT URINALYSIS DIP (MANUAL ENTRY)
Bilirubin, UA: NEGATIVE
Glucose, UA: NEGATIVE
Ketones, POC UA: NEGATIVE
Nitrite, UA: NEGATIVE
Protein Ur, POC: >=300
Spec Grav, UA: >=1.03 (ref 1.005–1.03)
Urobilinogen, UA: 0.2 (ref 0–1)
pH, UA: 6.5 (ref 5–8)

## 2012-07-24 MED ORDER — CIPROFLOXACIN HCL 500 MG PO TABS: 500.0000 mg | ORAL_TABLET | Freq: Two times a day (BID) | ORAL | Status: DC

## 2012-07-24 NOTE — ED Provider Notes (Addendum)
History     CSN: [id number]  Arrival date & time 07/24/12  1733   First MD Initiated Contact with Patient 07/24/12 1739      Chief Complaint  Patient presents with  . Urinary Frequency   HPI  DYSURIA Onset:  2-3 days  Description: dysuria, increased urinary frequency  Modifying factors: L ureteral stent in place. Has followup with urology later this week for ureteral stent removal in 2 weeks per pt.   Symptoms Urgency:  yes Frequency: yes  Hesitancy:  yes Hematuria:  no Flank Pain:  no Fever: no Nausea/Vomiting:  no Missed LMP: no STD exposure: no Discharge: no Irritants: no Rash: no  Red Flags   More than 3 UTI's last 12 months:  no PMH of  Diabetes or Immunosuppression:  no Renal Disease/Calculi: yes Urinary Tract Abnormality:  yes Instrumentation or Trauma: yes    Past Medical History  Diagnosis Date  . Hyperlipidemia   . Hypertension   . Hydronephrosis, left   . Gross hematuria   . BPH (benign prostatic hypertrophy)   . Elevated PSA   . Acid reflux OCCASIONALLY TAKE TUMS  . OA (osteoarthritis) knees    Past Surgical History  Procedure Laterality Date  . Hip surgery      Replacement  . Total hip arthroplasty  07-09-2005  DR ALUISIO    RIGHT HIP OA  . Left knee surgery  1993  (APPROX)  . Appendectomy  AGE 30  . Cataract extraction w/ intraocular lens implant  2013    RIGHT EYE  . Cystoscopy  02/13/2012    Procedure: CYSTOSCOPY FLEXIBLE;  Surgeon: Lindaann Slough, MD;  Location: [facility];  Service: Urology;  Laterality: N/A;    Family History  Problem Relation Age of Onset  . Hypertension Mother   . Cancer Father     prostate  . Hypertension Father   . Diabetes Father     History  Substance Use Topics  . Smoking status: Never Smoker   . Smokeless tobacco: Never Used  . Alcohol Use: 1.8 oz/week    3 Glasses of wine per week      Review of Systems  All other systems reviewed and are negative.    Allergies   Lipitor  Home Medications   Current Outpatient Rx  Name  Route  Sig  Dispense  Refill  . acetaminophen (TYLENOL) 500 MG tablet   Oral   Take 1,000 mg by mouth every 6 (six) hours as needed. For pain         . amLODipine (NORVASC) 5 MG tablet   Oral   Take 5 mg by mouth every morning.         [name] Kitchen omega-3 acid ethyl esters (LOVAZA) 1 G capsule   Oral   Take 2 g by mouth daily.           BP 108/67  Pulse 103  Temp(Src) 98.4 F (36.9 C) (Oral)  Resp 16  Ht 6\' 1"  (1.854 m)  Wt 202 lb 8 oz (91.853 kg)  BMI 26.72 kg/m2  SpO2 96%  Physical Exam  Constitutional: He appears well-developed and well-nourished.  HENT:  Head: Normocephalic and atraumatic.  Eyes: Conjunctivae are normal. Pupils are equal, round, and reactive to light.  Neck: Normal range of motion.  Cardiovascular: Normal rate and regular rhythm.   Pulmonary/Chest: Effort normal.  Abdominal: Soft. Bowel sounds are normal. There is no tenderness.  No flank pain    Musculoskeletal: Normal range  of motion.  Neurological: He is alert.  Skin: Skin is warm.    ED Course  Procedures (including critical care time)  Labs Reviewed - No data to display No results found.   1. Urinary frequency   2. Complicated UTI (urinary tract infection)       MDM  Will place on extended course of cipro with plan for follow up with urology in 2-3 days. Has had worse cases in the past. This is mild. Overall well appearing.  Discussed GU and infectious red flags.  Follow up with urology.     The patient and/or caregiver has been counseled thoroughly with regard to treatment plan and/or medications prescribed including dosage, schedule, interactions, rationale for use, and possible side effects and they verbalize understanding. Diagnoses and expected course of recovery discussed and will return if not improved as expected or if the condition worsens. Patient and/or caregiver verbalized understanding.             Doree Albee, MD 07/24/12 1800  Doree Albee, MD 07/24/12 [phone]

## 2012-07-26 ENCOUNTER — Other Ambulatory Visit (HOSPITAL_COMMUNITY): Payer: Self-pay | Admitting: Urology

## 2012-07-26 LAB — URINE CULTURE: Colony Count: 5000

## 2012-07-26 NOTE — Patient Instructions (Addendum)
[address]  07/26/2012   Your procedure is scheduled on: 08-03-2012  Report to [facility] at 0800 AM.  Call this number if you have problems the morning of surgery [phone]   Remember:   Do not eat food or drink liquids :After Midnight.     Take these medicines the morning of surgery with A SIP OF WATER: amlodipine, tylenol if needed                                SEE [place] PREPARING FOR SURGERY SHEET   Do not wear jewelry, make-up or nail polish.  Do not wear lotions, powders, or perfumes. You may wear deodorant.   Men may shave face and neck.  Do not bring valuables to the hospital.  Contacts, dentures or bridgework may not be worn into surgery.  Leave suitcase in the car. After surgery it may be brought to your room.  For patients admitted to the hospital, checkout time is 11:00 AM the day of discharge.   Patients discharged the day of surgery will not be allowed to drive home.  Name and phone number of your driver:  Special Instructions: N/A   Please read over the following fact sheets that you were given: MRSA Information.  Call Cain Sieve RN pre op nurse if needed [phone]    FAILURE TO FOLLOW THESE INSTRUCTIONS MAY RESULT IN THE CANCELLATION OF YOUR SURGERY. PATIENT SIGNATURE___________________________________________

## 2012-07-27 ENCOUNTER — Encounter (HOSPITAL_COMMUNITY)
Admission: RE | Admit: 2012-07-27 | Discharge: 2012-07-27 | Disposition: A | Discharge status: Home / Self Care | Payer: Medicare Other | Source: Ambulatory Visit | Attending: Urology | Admitting: Urology

## 2012-07-27 ENCOUNTER — Telehealth: Payer: Self-pay | Admitting: Emergency Medicine

## 2012-07-27 ENCOUNTER — Ambulatory Visit (HOSPITAL_COMMUNITY)
Admission: RE | Admit: 2012-07-27 | Discharge: 2012-07-27 | Disposition: A | Discharge status: Home / Self Care | Payer: Medicare Other | Source: Ambulatory Visit | Attending: Urology | Admitting: Urology

## 2012-07-27 ENCOUNTER — Encounter (HOSPITAL_COMMUNITY): Payer: Self-pay

## 2012-07-27 DIAGNOSIS — R350 Frequency of micturition: Secondary | ICD-10-CM | POA: Insufficient documentation

## 2012-07-27 DIAGNOSIS — Z01818 Encounter for other preprocedural examination: Secondary | ICD-10-CM | POA: Insufficient documentation

## 2012-07-27 DIAGNOSIS — N401 Enlarged prostate with lower urinary tract symptoms: Secondary | ICD-10-CM | POA: Insufficient documentation

## 2012-07-27 DIAGNOSIS — Z01812 Encounter for preprocedural laboratory examination: Secondary | ICD-10-CM | POA: Insufficient documentation

## 2012-07-27 DIAGNOSIS — I10 Essential (primary) hypertension: Secondary | ICD-10-CM | POA: Insufficient documentation

## 2012-07-27 DIAGNOSIS — N138 Other obstructive and reflux uropathy: Secondary | ICD-10-CM | POA: Insufficient documentation

## 2012-07-27 HISTORY — DX: Adverse effect of unspecified anesthetic, initial encounter: T41.45XA

## 2012-07-27 HISTORY — DX: Other complications of anesthesia, initial encounter: T88.59XA

## 2012-07-27 LAB — BASIC METABOLIC PANEL
BUN: 12 mg/dL (ref 6–23)
CO2: 26 mEq/L (ref 19–32)
Calcium: 9.6 mg/dL (ref 8.4–10.5)
Chloride: 104 mEq/L (ref 96–112)
Creatinine, Ser: 1.34 mg/dL (ref 0.50–1.35)
GFR calc Af Amer: 60 mL/min — ABNORMAL LOW (ref >90)
GFR calc non Af Amer: 52 mL/min — ABNORMAL LOW (ref >90)
Glucose, Bld: 142 mg/dL — ABNORMAL HIGH (ref 70–99)
Potassium: 4.5 mEq/L (ref 3.5–5.1)
Sodium: 140 mEq/L (ref 135–145)

## 2012-07-27 LAB — CBC
HCT: 39 % (ref 39.0–52.0)
Hemoglobin: 12.9 g/dL — ABNORMAL LOW (ref 13.0–17.0)
MCH: 31.5 pg (ref 26.0–34.0)
MCHC: 33.1 g/dL (ref 30.0–36.0)
MCV: 95.1 fL (ref 78.0–100.0)
Platelets: 367 10*3/uL (ref 150–400)
RBC: 4.1 MIL/uL — ABNORMAL LOW (ref 4.22–5.81)
RDW: 13.9 % (ref 11.5–15.5)
WBC: 5.2 10*3/uL (ref 4.0–10.5)

## 2012-07-27 LAB — SURGICAL PCR SCREEN
MRSA, PCR: NEGATIVE
Staphylococcus aureus: NEGATIVE

## 2012-08-03 ENCOUNTER — Encounter (HOSPITAL_COMMUNITY)
Admission: RE | Disposition: A | Discharge status: Home / Self Care | Payer: Self-pay | Source: Ambulatory Visit | Attending: Urology

## 2012-08-03 ENCOUNTER — Observation Stay (HOSPITAL_COMMUNITY)
Admission: RE | Admit: 2012-08-03 | Discharge: 2012-08-06 | Disposition: A | Discharge status: Home / Self Care | Payer: Medicare Other | Source: Ambulatory Visit | Attending: Urology | Admitting: Urology

## 2012-08-03 ENCOUNTER — Inpatient Hospital Stay (HOSPITAL_COMMUNITY): Payer: Medicare Other | Admitting: *Deleted

## 2012-08-03 ENCOUNTER — Encounter (HOSPITAL_COMMUNITY): Payer: Self-pay | Admitting: *Deleted

## 2012-08-03 DIAGNOSIS — N1330 Unspecified hydronephrosis: Secondary | ICD-10-CM | POA: Insufficient documentation

## 2012-08-03 DIAGNOSIS — K219 Gastro-esophageal reflux disease without esophagitis: Secondary | ICD-10-CM | POA: Insufficient documentation

## 2012-08-03 DIAGNOSIS — N401 Enlarged prostate with lower urinary tract symptoms: Principal | ICD-10-CM | POA: Insufficient documentation

## 2012-08-03 DIAGNOSIS — M1990 Unspecified osteoarthritis, unspecified site: Secondary | ICD-10-CM | POA: Diagnosis not present

## 2012-08-03 DIAGNOSIS — N139 Obstructive and reflux uropathy, unspecified: Secondary | ICD-10-CM | POA: Diagnosis not present

## 2012-08-03 DIAGNOSIS — Z79899 Other long term (current) drug therapy: Secondary | ICD-10-CM | POA: Insufficient documentation

## 2012-08-03 DIAGNOSIS — E7800 Pure hypercholesterolemia, unspecified: Secondary | ICD-10-CM | POA: Diagnosis not present

## 2012-08-03 DIAGNOSIS — R310 Gross hematuria: Secondary | ICD-10-CM | POA: Insufficient documentation

## 2012-08-03 DIAGNOSIS — M109 Gout, unspecified: Secondary | ICD-10-CM | POA: Insufficient documentation

## 2012-08-03 DIAGNOSIS — I10 Essential (primary) hypertension: Secondary | ICD-10-CM | POA: Diagnosis not present

## 2012-08-03 DIAGNOSIS — N4289 Other specified disorders of prostate: Secondary | ICD-10-CM | POA: Insufficient documentation

## 2012-08-03 DIAGNOSIS — N400 Enlarged prostate without lower urinary tract symptoms: Secondary | ICD-10-CM | POA: Diagnosis not present

## 2012-08-03 DIAGNOSIS — Z96649 Presence of unspecified artificial hip joint: Secondary | ICD-10-CM | POA: Insufficient documentation

## 2012-08-03 DIAGNOSIS — I739 Peripheral vascular disease, unspecified: Secondary | ICD-10-CM | POA: Insufficient documentation

## 2012-08-03 DIAGNOSIS — N138 Other obstructive and reflux uropathy: Principal | ICD-10-CM | POA: Insufficient documentation

## 2012-08-03 DIAGNOSIS — R972 Elevated prostate specific antigen [PSA]: Secondary | ICD-10-CM | POA: Insufficient documentation

## 2012-08-03 DIAGNOSIS — N411 Chronic prostatitis: Secondary | ICD-10-CM | POA: Diagnosis not present

## 2012-08-03 HISTORY — PX: PROSTATECTOMY: SHX69

## 2012-08-03 LAB — TYPE AND SCREEN
ABO/RH(D): O POS
Antibody Screen: NEGATIVE

## 2012-08-03 LAB — HEMOGLOBIN AND HEMATOCRIT, BLOOD
HCT: 32.8 % — ABNORMAL LOW (ref 39.0–52.0)
Hemoglobin: 10.9 g/dL — ABNORMAL LOW (ref 13.0–17.0)

## 2012-08-03 SURGERY — PROSTATECTOMY, RETROPUBIC
Anesthesia: General | Wound class: Clean Contaminated

## 2012-08-03 MED ORDER — MEPERIDINE HCL 50 MG/ML IJ SOLN: 6.2500 mg | INTRAMUSCULAR | Status: DC | PRN

## 2012-08-03 MED ORDER — DEXTROSE-NACL 5-0.45 % IV SOLN
INTRAVENOUS | Status: DC
  Administered 2012-08-03 – 2012-08-06 (×7): via INTRAVENOUS

## 2012-08-03 MED ORDER — ONDANSETRON HCL 4 MG/2ML IJ SOLN
INTRAMUSCULAR | Status: DC | PRN
  Administered 2012-08-03: 4 mg via INTRAVENOUS

## 2012-08-03 MED ORDER — FENTANYL CITRATE 0.05 MG/ML IJ SOLN
INTRAMUSCULAR | Status: DC | PRN
  Administered 2012-08-03: 50 ug via INTRAVENOUS
  Administered 2012-08-03: 100 ug via INTRAVENOUS
  Administered 2012-08-03 (×2): 50 ug via INTRAVENOUS
  Administered 2012-08-03: 100 ug via INTRAVENOUS
  Administered 2012-08-03 (×2): 25 ug via INTRAVENOUS
  Administered 2012-08-03: 100 ug via INTRAVENOUS

## 2012-08-03 MED ORDER — OXYCODONE HCL 5 MG/5ML PO SOLN: 5.0000 mg | Freq: Once | ORAL | Status: DC | PRN

## 2012-08-03 MED ORDER — PROMETHAZINE HCL 25 MG/ML IJ SOLN: 6.2500 mg | INTRAMUSCULAR | Status: DC | PRN

## 2012-08-03 MED ORDER — ACETAMINOPHEN 10 MG/ML IV SOLN
INTRAVENOUS | Status: DC | PRN
  Administered 2012-08-03: 1000 mg via INTRAVENOUS

## 2012-08-03 MED ORDER — OXYBUTYNIN CHLORIDE 5 MG PO TABS
5.0000 mg | ORAL_TABLET | Freq: Three times a day (TID) | ORAL | Status: DC | PRN
  Administered 2012-08-04: 5 mg via ORAL
  Filled 2012-08-03 (×2): qty 1

## 2012-08-03 MED ORDER — BUPIVACAINE LIPOSOME 1.3 % IJ SUSP
20.0000 mL | Freq: Once | INTRAMUSCULAR | Status: DC
  Filled 2012-08-03: qty 20

## 2012-08-03 MED ORDER — MIDAZOLAM HCL 5 MG/5ML IJ SOLN
INTRAMUSCULAR | Status: DC | PRN
  Administered 2012-08-03 (×2): 1 mg via INTRAVENOUS

## 2012-08-03 MED ORDER — PHENYLEPHRINE HCL 10 MG/ML IJ SOLN
INTRAMUSCULAR | Status: DC | PRN
Start: 2012-08-03 — End: 2012-08-03
  Administered 2012-08-03: 40 ug via INTRAVENOUS

## 2012-08-03 MED ORDER — LABETALOL HCL 5 MG/ML IV SOLN
INTRAVENOUS | Status: DC | PRN
  Administered 2012-08-03: 2.5 mg via INTRAVENOUS

## 2012-08-03 MED ORDER — ESMOLOL HCL 10 MG/ML IV SOLN
INTRAVENOUS | Status: DC | PRN
  Administered 2012-08-03: 20 mg via INTRAVENOUS

## 2012-08-03 MED ORDER — GLYCOPYRROLATE 0.2 MG/ML IJ SOLN
INTRAMUSCULAR | Status: DC | PRN
  Administered 2012-08-03: .8 mg via INTRAVENOUS

## 2012-08-03 MED ORDER — LIDOCAINE HCL (CARDIAC) 20 MG/ML IV SOLN
INTRAVENOUS | Status: DC | PRN
  Administered 2012-08-03: 50 mg via INTRAVENOUS

## 2012-08-03 MED ORDER — CIPROFLOXACIN IN D5W 400 MG/200ML IV SOLN
400.0000 mg | INTRAVENOUS | Status: AC
  Administered 2012-08-03: 400 mg via INTRAVENOUS

## 2012-08-03 MED ORDER — PROPOFOL 10 MG/ML IV BOLUS
INTRAVENOUS | Status: DC | PRN
  Administered 2012-08-03: 200 mg via INTRAVENOUS

## 2012-08-03 MED ORDER — LACTATED RINGERS IV SOLN
INTRAVENOUS | Status: DC
  Administered 2012-08-03 (×3): via INTRAVENOUS
  Administered 2012-08-03: 1000 mL via INTRAVENOUS

## 2012-08-03 MED ORDER — OXYCODONE HCL 5 MG PO TABS: 5.0000 mg | ORAL_TABLET | Freq: Once | ORAL | Status: DC | PRN

## 2012-08-03 MED ORDER — MORPHINE SULFATE 2 MG/ML IJ SOLN: 2.0000 mg | INTRAMUSCULAR | Status: DC | PRN

## 2012-08-03 MED ORDER — SODIUM CHLORIDE 0.9 % IJ SOLN
INTRAMUSCULAR | Status: DC | PRN
  Administered 2012-08-03: 13:00:00

## 2012-08-03 MED ORDER — HYDROMORPHONE HCL PF 1 MG/ML IJ SOLN
INTRAMUSCULAR | Status: DC | PRN
  Administered 2012-08-03 (×2): 1 mg via INTRAVENOUS

## 2012-08-03 MED ORDER — HYDROMORPHONE HCL PF 1 MG/ML IJ SOLN
0.2500 mg | INTRAMUSCULAR | Status: DC | PRN
Start: 2012-08-03 — End: 2012-08-03

## 2012-08-03 MED ORDER — NEOSTIGMINE METHYLSULFATE 1 MG/ML IJ SOLN
INTRAMUSCULAR | Status: DC | PRN
  Administered 2012-08-03: 5 mg via INTRAVENOUS

## 2012-08-03 MED ORDER — AMLODIPINE BESYLATE 5 MG PO TABS
5.0000 mg | ORAL_TABLET | Freq: Every morning | ORAL | Status: DC
  Administered 2012-08-04 – 2012-08-06 (×3): 5 mg via ORAL
  Filled 2012-08-03 (×3): qty 1

## 2012-08-03 MED ORDER — CIPROFLOXACIN IN D5W 200 MG/100ML IV SOLN
200.0000 mg | Freq: Two times a day (BID) | INTRAVENOUS | Status: AC
  Administered 2012-08-03: 200 mg via INTRAVENOUS
  Filled 2012-08-03: qty 100

## 2012-08-03 MED ORDER — HYDROCODONE-ACETAMINOPHEN 5-325 MG PO TABS
1.0000 | ORAL_TABLET | ORAL | Status: DC | PRN
  Filled 2012-08-03: qty 1

## 2012-08-03 MED ORDER — ROCURONIUM BROMIDE 100 MG/10ML IV SOLN
INTRAVENOUS | Status: DC | PRN
  Administered 2012-08-03 (×2): 10 mg via INTRAVENOUS
  Administered 2012-08-03: 50 mg via INTRAVENOUS

## 2012-08-03 MED ORDER — ACETAMINOPHEN 10 MG/ML IV SOLN: 1000.0000 mg | Freq: Once | INTRAVENOUS | Status: DC | PRN

## 2012-08-03 MED ORDER — ACETAMINOPHEN 10 MG/ML IV SOLN
1000.0000 mg | Freq: Four times a day (QID) | INTRAVENOUS | Status: AC
  Administered 2012-08-03 – 2012-08-04 (×4): 1000 mg via INTRAVENOUS
  Filled 2012-08-03 (×4): qty 100

## 2012-08-03 SURGICAL SUPPLY — 52 items
BAG URINE DRAINAGE (UROLOGICAL SUPPLIES) ×2 IMPLANT
BLADE EXTENDED COATED 6.5IN (ELECTRODE) ×2 IMPLANT
BLADE HEX COATED 2.75 (ELECTRODE) ×2 IMPLANT
CATH FOLEY 2WAY SLVR  5CC 20FR (CATHETERS) ×1
CATH FOLEY 2WAY SLVR 30CC 24FR (CATHETERS) ×1 IMPLANT
CATH FOLEY 2WAY SLVR 5CC 20FR (CATHETERS) ×1 IMPLANT
CATH FOLEY 3WAY 30CC 24FR (CATHETERS) ×2
CATH URTH STD 24FR FL 3W 2 (CATHETERS) IMPLANT
CLIP LIGATING HEM O LOK PURPLE (MISCELLANEOUS) ×3 IMPLANT
CLIP LIGATING HEMO O LOK GREEN (MISCELLANEOUS) ×3 IMPLANT
CLOTH BEACON ORANGE TIMEOUT ST (SAFETY) ×2 IMPLANT
COVER SURGICAL LIGHT HANDLE (MISCELLANEOUS) ×2 IMPLANT
DISSECTOR ROUND CHERRY 3/8 STR (MISCELLANEOUS) ×2 IMPLANT
DRAIN CHANNEL 10F 3/8 F FF (DRAIN) ×2 IMPLANT
DRAPE LAPAROTOMY T 102X78X121 (DRAPES) ×2 IMPLANT
DRAPE TABLE BACK 44X90 PK DISP (DRAPES) ×2 IMPLANT
DRAPE WARM FLUID 44X44 (DRAPE) ×2 IMPLANT
ELECT REM PT RETURN 9FT ADLT (ELECTROSURGICAL) ×2
ELECTRODE REM PT RTRN 9FT ADLT (ELECTROSURGICAL) ×1 IMPLANT
EVACUATOR SILICONE 100CC (DRAIN) ×2 IMPLANT
GAUZE SPONGE 4X4 16PLY XRAY LF (GAUZE/BANDAGES/DRESSINGS) ×3 IMPLANT
GLOVE BIOGEL M 7.0 STRL (GLOVE) ×2 IMPLANT
GOWN STRL NON-REIN LRG LVL3 (GOWN DISPOSABLE) ×2 IMPLANT
HEMOSTAT SURGICEL 4X8 (HEMOSTASIS) ×1 IMPLANT
KIT BASIN OR (CUSTOM PROCEDURE TRAY) ×2 IMPLANT
LUBRICANT JELLY ST 5GR 8946 (MISCELLANEOUS) ×5 IMPLANT
NS IRRIG 1000ML POUR BTL (IV SOLUTION) ×4 IMPLANT
PACK GENERAL/GYN (CUSTOM PROCEDURE TRAY) ×2 IMPLANT
PLUG CATH AND CAP STER (CATHETERS) ×2 IMPLANT
SET IRRIG Y TYPE TUR BLADDER L (SET/KITS/TRAYS/PACK) ×1 IMPLANT
SPONGE GAUZE 4X4 12PLY (GAUZE/BANDAGES/DRESSINGS) ×2 IMPLANT
SPONGE LAP 18X18 X RAY DECT (DISPOSABLE) ×2 IMPLANT
SPONGE LAP 4X18 X RAY DECT (DISPOSABLE) ×1 IMPLANT
STAPLER VISISTAT 35W (STAPLE) ×2 IMPLANT
SUT CHROMIC 0 CT 1 (SUTURE) ×2 IMPLANT
SUT CHROMIC 4 0 RB 1X27 (SUTURE) ×6 IMPLANT
SUT ETHILON 3 0 PS 1 (SUTURE) ×2 IMPLANT
SUT MNCRL AB 4-0 PS2 18 (SUTURE) ×2 IMPLANT
SUT PDS AB 0 CT 36 (SUTURE) ×4 IMPLANT
SUT SILK 0 (SUTURE)
SUT SILK 0 30XBRD TIE 6 (SUTURE) ×2 IMPLANT
SUT VIC AB 0 BRD 54 (SUTURE) ×3 IMPLANT
SUT VIC AB 0 CT1 27 (SUTURE) ×4
SUT VIC AB 0 CT1 27XBRD ANTBC (SUTURE) ×1 IMPLANT
SUT VIC AB 0 UR5 27 (SUTURE) ×3 IMPLANT
SUT VIC AB 2-0 SH 27 (SUTURE) ×4
SUT VIC AB 2-0 SH 27X BRD (SUTURE) IMPLANT
SYR 30ML LL (SYRINGE) ×2 IMPLANT
TAPE UMBILICAL COTTON 1/8X30 (MISCELLANEOUS) ×1 IMPLANT
TOWEL OR 17X26 10 PK STRL BLUE (TOWEL DISPOSABLE) ×3 IMPLANT
TOWEL OR NON WOVEN STRL DISP B (DISPOSABLE) ×2 IMPLANT
WATER STERILE IRR 1500ML POUR (IV SOLUTION) ×1 IMPLANT

## 2012-08-03 NOTE — Preoperative (Signed)
Beta Blockers   Reason not to administer Beta Blockers:Not Applicable 

## 2012-08-03 NOTE — Transfer of Care (Signed)
Immediate Anesthesia Transfer of Care Note  Patient: Albert Hicks  Procedure(s) Performed: Procedure(s): SIMPLE RETROPUBIC PROSTATECTOMY, removal of left double J stent.  (N/A)  Patient Location: PACU  Anesthesia Type:General  Level of Consciousness: awake, oriented, pateint uncooperative, lethargic and responds to stimulation  Airway & Oxygen Therapy: Patient Spontanous Breathing and Patient connected to face mask oxygen  Post-op Assessment: Report given to PACU RN, Post -op Vital signs reviewed and stable and Patient moving all extremities  Post vital signs: Reviewed and stable  Complications: No apparent anesthesia complications

## 2012-08-03 NOTE — Anesthesia Preprocedure Evaluation (Addendum)
Anesthesia Evaluation  Patient identified by MRN, date of birth, ID band Patient awake    Reviewed: Allergy & Precautions, H&P , NPO status , Patient's Chart, lab work & pertinent test results  Airway Mallampati: II TM Distance: >3 FB Neck ROM: Full    Dental  (+) Teeth Intact, Caps and Dental Advisory Given   Pulmonary neg pulmonary ROS,  breath sounds clear to auscultation  Pulmonary exam normal       Cardiovascular hypertension, Pt. on medications + Peripheral Vascular Disease Rhythm:Regular Rate:Normal     Neuro/Psych negative neurological ROS  negative psych ROS   GI/Hepatic Neg liver ROS, GERD-  Medicated,  Endo/Other  negative endocrine ROS  Renal/GU Renal disease     Musculoskeletal  (+) Arthritis -, Osteoarthritis,    Abdominal   Peds  Hematology negative hematology ROS (+)   Anesthesia Other Findings   Reproductive/Obstetrics negative OB ROS                           Anesthesia Physical  Anesthesia Plan  ASA: II  Anesthesia Plan: General   Post-op Pain Management:    Induction: Intravenous  Airway Management Planned: Oral ETT  Additional Equipment:   Intra-op Plan:   Post-operative Plan: Extubation in OR  Informed Consent: I have reviewed the patients History and Physical, chart, labs and discussed the procedure including the risks, benefits and alternatives for the proposed anesthesia with the patient or authorized representative who has indicated his/her understanding and acceptance.   Dental advisory given  Plan Discussed with: CRNA  Anesthesia Plan Comments:        Anesthesia Quick Evaluation

## 2012-08-03 NOTE — Anesthesia Postprocedure Evaluation (Signed)
Anesthesia Post Note  Patient: Albert Hicks  Procedure(s) Performed: Procedure(s) (LRB): SIMPLE RETROPUBIC PROSTATECTOMY, removal of left double J stent.  (N/A)  Anesthesia type: General  Patient location: PACU  Post pain: Pain level controlled  Post assessment: Post-op Vital signs reviewed  Last Vitals: BP 133/63  Pulse 68  Temp(Src) 36.6 C (Oral)  Resp 12  Ht 6\' 1"  (1.854 m)  Wt 213 lb (96.616 kg)  BMI 28.11 kg/m2  SpO2 100%  Post vital signs: Reviewed  Level of consciousness: sedated  Complications: No apparent anesthesia complications

## 2012-08-03 NOTE — H&P (Signed)
History of Present Illness  Mr Albert Hicks had a left percutaneous nephrostomy with antegrade JJ stent placement for left hydronephrosis. Antegrade pyelogram shows no filling defect in the ureter that was dilated down to the level of the bladder.  The ureteral orifices could not be identified at time of cystoscopy because of markedly enlarged prostate that measures 240 ml.  The hydronephrosis had resolved with PCN and stent placement.  He needs an open prostatectomy to relieve obstruction.  I told him that he would probably need TURP or Green Light Laser Vaporization twice in order to reduce the prostate size.  He agrees to proceed with open prostatectomy.   Past Medical History Problems  1. History of  Esophageal Reflux 530.81 2. History of  Gout 274.9 3. History of  Hypercholesterolemia 272.0  Surgical History Problems  1. History of  Cystoscopy (Diagnostic) 2. History of  Knee Surgery Left 3. History of  Total Hip Replacement  Current Meds 1. AmLODIPine Besylate 5 MG Oral Tablet; Therapy: 17Nov2013 to 2. Ciprofloxacin HCl 500 MG Oral Tablet; Therapy: 17Nov2013 to 3. Eldertonic Oral Elixir; TAKE 1 TBSP 3 times daily; Therapy: 21Feb2014 to (Last Rx:24Feb2014)   Requested for: 24Feb2014 4. Hydrochlorothiazide 25 MG Oral Tablet; Therapy: 17Nov2013 to 5. Livalo 2 MG Oral Tablet; Therapy: (Recorded:19Nov2013) to  Allergies Medication  1. No Known Drug Allergies  Family History Problems  1. Family history of  Family Health Status Number Of Children 1 son 4 daughters 2. Paternal history of  Prostate Cancer V16.42  Social History Problems  1. Alcohol Use 2 per day 2. Caffeine Use 1 per day 3. Marital History - Single 4. Never A Smoker Denied  5. History of  Tobacco Use  Review of Systems Genitourinary, constitutional, skin, eye, otolaryngeal, hematologic/lymphatic, cardiovascular, pulmonary, endocrine, musculoskeletal, gastrointestinal, neurological and psychiatric system(s) were  reviewed and pertinent findings if present are noted.    Vitals Vital Signs [Data Includes: Last 1 Day]  17Apr2014 01:28PM  Blood Pressure: 145 / 75 Temperature: 98.5 F Heart Rate: 86  Physical Exam Constitutional: Well nourished and well developed . No acute distress.  ENT:. The ears and nose are normal in appearance.  Neck: The appearance of the neck is normal and no neck mass is present.  Pulmonary: No respiratory distress and normal respiratory rhythm and effort.  Cardiovascular: Heart rate and rhythm are normal . No peripheral edema.  Abdomen: The abdomen is soft and nontender. No masses are palpated. No CVA tenderness. No hernias are palpable. No hepatosplenomegaly noted.  Rectal: Rectal exam demonstrates normal sphincter tone, no tenderness and no masses. Estimated prostate size is 4+. The prostate has no nodularity and is not tender. The left seminal vesicle is nonpalpable. The right seminal vesicle is nonpalpable. The perineum is normal on inspection.  Genitourinary: Examination of the penis demonstrates no discharge, no masses, no lesions and a normal meatus. The scrotum is without lesions. The right epididymis is palpably normal and non-tender. The left epididymis is palpably normal and non-tender. The right testis is non-tender and without masses. The left testis is non-tender and without masses.  Lymphatics: The femoral and inguinal nodes are not enlarged or tender.  Skin: Normal skin turgor, no visible rash and no visible skin lesions.  Neuro/Psych:. Mood and affect are appropriate.    Results/Data Urine [Data Includes: Last 1 Day]   17Apr2014  COLOR YELLOW   APPEARANCE CLEAR   SPECIFIC GRAVITY 1.020   pH 5.5   GLUCOSE NEG mg/dL  BILIRUBIN NEG  KETONE NEG mg/dL  BLOOD NEG   PROTEIN TRACE mg/dL  UROBILINOGEN 0.2 mg/dL  NITRITE NEG   LEUKOCYTE ESTERASE SMALL   SQUAMOUS EPITHELIAL/HPF FEW   WBC 11-20 WBC/hpf  RBC 0-2 RBC/hpf  BACTERIA FEW   CRYSTALS NONE SEEN    CASTS NONE SEEN    Assessment Assessed  1. Benign Prostatic Hypertrophy With Urinary Obstruction 600.01 2. Hydronephrosis On The Left 591  Plan Benign Prostatic Hypertrophy With Urinary Obstruction (600.01)  1. Follow-up Schedule Surgery Office  Follow-up  Requested for: 17Apr2014 Health Maintenance (V70.0)  2. UA With REFLEX  Done: 17Apr2014 12:47PM   Simple retropubic prostatectomy.  The procedure, risks, benefits were discussed with the patient.  The risks include but are not limited to hemorrhage, infection, bladder injury, urinary incontinence.  He understands and wishes to proceed.   Signatures Electronically signed by : Su Grand, M.D.; Jul 08 2012  6:01PM

## 2012-08-03 NOTE — Op Note (Signed)
Albert Hicks is a 72 y.o.   08/03/2012  General  Pre-op diagnosis: Left hydronephrosis, gross hematuria, BPH, elevated PSA  Postop diagnosis: Same   Procedure done: Simple retropubic prostatectomy  Surgeon: Wendie Simmer. Huntleigh Doolen  Assistant: Pecola Leisure, PA  Indication: Patient is a 72 years old male who had one episode of gross painless hematuria about 6 months ago. He was seen in the emergency room and a CT scan showed left hydronephrosis. Cystoscopy showed markedly enlarged prostate. The ureteral orifices could not be identified because of the size of the prostate. A left percutaneous nephrostomy was done and antegrade pyelogram showed no evidence of filling defect in the ureter. There is a J. hook deformity of the distal ureter. An antegrade double-J stent was inserted. The hydronephrosis resolved. Patient has a history of elevated PSA with 3 negative prostate biopsies in the past. Prostate ultrasound showed a volume of 240 cc. Treatment options were then discussed with the patient: TURP, greenlight laser, open prostatectomy. I explained to him that if he had a TURP or greenlight laser he probably would require more than one procedure to remove the prostate. He then elected to proceed with a retropubic prostatectomy. The risks of the procedure were discussed with him. The risks include but are not limited to hemorrhage, infection, urinary incontinence, erectile dysfunction. He understands and wishes to proceed  Procedure: The patient was identified by his wrist band and proper timeout was taken.  Under general anesthesia he was prepped and draped and placed in the supine position. A #20 French Foley catheter was inserted in the bladder. A longitudinal incision was then made from the symphysis pubis to about 3 cm below the umbilicus. The incision was carried down to the rectus fascia which was then incised. The recti muscles were split in the midline. The retropubic space was then entered. A  Bookwalter retractor was then used to retract the recti muscles. By palpation the prostate was found to be markedly enlarged. The loose areolar tissues anterior to the prostate were dissected from the capsule of the prostate and ligated and cut in between ligatures. 2 stay sutures of 0 Vicryl were then placed on the anterior capsule of the prostate. A capsulotomy was done in between those 2 sutures. With an index finger the adenoma was dissected from the capsule of the prostate and removed in toto. The #20 French Foley catheter was removed. The adenoma is markedly enlarged and consistent with the ultrasound and cystoscopy findings. The left double-J stent was then removed. Hemostasis was then done with #0 Vicryl at the 5 and 7:00 position at the bladder neck, care being taken not to include the ureteral orifices in the sutures. Hemostasis was then completed with electrocautery. Then a #24 [place] three-way Foley catheter was inserted in the bladder. The balloon of the Foley catheter was inflated with 30 cc of water. Then the capsulotomy was closed with #0 Vicryl. The catheter was then attached to continuous bladder irrigation and the capsulotomy closure was watertight. A Blake drain was then placed in the Retzius space and brought out through a separate stab wound. The wound was then irrigated with normal saline.  The fascia was closed with #0 PDS. 20 cc of Exparel in 20 cc of normal saline were then injected in the subcutaneous tissues. The subcutaneous tissues were then approximated with #3-0 Vicryl. The skin was closed with #4-0 Monocryl using subcuticular sutures.  EBL: 700 cc  Needles, sponges count: Correct on 2 counts.  The patient tolerated  the procedure well and left the OR in satisfactory condition to postanesthesia care unit.

## 2012-08-03 NOTE — Progress Notes (Signed)
Afebrile.  V/S stable. Sleepy but easily arousable No pain.   Foley draining well.  CBI running.  Urine slightly bloody. Hgb: 10.9  Hct: 32.8 Satisfactory early post-op.

## 2012-08-04 ENCOUNTER — Encounter (HOSPITAL_COMMUNITY): Payer: Self-pay | Admitting: Urology

## 2012-08-04 LAB — BASIC METABOLIC PANEL
BUN: 11 mg/dL (ref 6–23)
CO2: 27 mEq/L (ref 19–32)
Calcium: 9.1 mg/dL (ref 8.4–10.5)
Chloride: 102 mEq/L (ref 96–112)
Creatinine, Ser: 1.09 mg/dL (ref 0.50–1.35)
GFR calc Af Amer: 77 mL/min — ABNORMAL LOW (ref >90)
GFR calc non Af Amer: 66 mL/min — ABNORMAL LOW (ref >90)
Glucose, Bld: 121 mg/dL — ABNORMAL HIGH (ref 70–99)
Potassium: 4.3 mEq/L (ref 3.5–5.1)
Sodium: 137 mEq/L (ref 135–145)

## 2012-08-04 LAB — POCT I-STAT 4, (NA,K, GLUC, HGB,HCT)
Glucose, Bld: 96 mg/dL (ref 70–99)
HCT: 26 % — ABNORMAL LOW (ref 39.0–52.0)
Hemoglobin: 8.8 g/dL — ABNORMAL LOW (ref 13.0–17.0)
Potassium: 4.2 mEq/L (ref 3.5–5.1)
Sodium: 137 mEq/L (ref 135–145)

## 2012-08-04 LAB — HEMOGLOBIN AND HEMATOCRIT, BLOOD
HCT: 34.7 % — ABNORMAL LOW (ref 39.0–52.0)
Hemoglobin: 11.5 g/dL — ABNORMAL LOW (ref 13.0–17.0)

## 2012-08-04 MED ORDER — BELLADONNA ALKALOIDS-OPIUM 16.2-60 MG RE SUPP
1.0000 | Freq: Four times a day (QID) | RECTAL | Status: DC | PRN
  Administered 2012-08-04 – 2012-08-05 (×2): 1 via RECTAL
  Filled 2012-08-04 (×2): qty 1

## 2012-08-04 MED ORDER — DIAZEPAM 2 MG PO TABS: 1.0000 mg | ORAL_TABLET | Freq: Two times a day (BID) | ORAL | Status: DC | PRN

## 2012-08-04 MED ORDER — CIPROFLOXACIN HCL 250 MG PO TABS
250.0000 mg | ORAL_TABLET | Freq: Two times a day (BID) | ORAL | Status: DC
  Administered 2012-08-04 – 2012-08-06 (×5): 250 mg via ORAL
  Filled 2012-08-04 (×7): qty 1

## 2012-08-04 NOTE — Progress Notes (Signed)
1 Day Post-Op Subjective: Patient reports No pain.  No nausea  Objective: Vital signs in last 24 hours: Temp:  [97.4 F (36.3 C)-98.9 F (37.2 C)] 98.9 F (37.2 C) (05/14 0523) Pulse Rate:  [53-88] 65 (05/14 0523) Resp:  [10-19] 19 (05/14 0523) BP: (109-143)/(60-78) 132/69 mmHg (05/14 0523) SpO2:  [97 %-100 %] 100 % (05/14 0523) Weight:  [96.616 kg (213 lb)] 96.616 kg (213 lb) (05/13 1519)  Intake/Output from previous day: 05/13 0701 - 05/14 0700 In: 3400 [I.V.:3400] Out: 29562 [Urine:10200; Drains:80; Blood:1200] Intake/Output this shift: Total I/O In: -  Out: 8225 [Urine:8200; Drains:25]  Physical Exam:  General: Alert.  Just finished walking. Abdomen: Soft, non distended,.   Wound clean and dry. CBI running.  Urine pink. Hgb:  11.5  Hct 34.7   Lab Results:  Recent Labs  08/03/12 1400 08/04/12 0447  HGB 10.9* 11.5*  HCT 32.8* 34.7*   BMET  Recent Labs  08/04/12 0447  NA 137  K 4.3  CL 102  CO2 27  GLUCOSE 121*  BUN 11  CREATININE 1.09  CALCIUM 9.1   No results found for this basename: LABPT, INR,  in the last 72 hours No results found for this basename: LABURIN,  in the last 72 hours Results for orders placed during the hospital encounter of 07/27/12  SURGICAL PCR SCREEN     Status: None   Collection Time    07/27/12  1:12 PM      Result Value Range Status   MRSA, PCR NEGATIVE  NEGATIVE Final   Staphylococcus aureus NEGATIVE  NEGATIVE Final   Comment:            The Xpert SA Assay (FDA     approved for NASAL specimens     in patients over 2 years of age),     is one component of     a comprehensive surveillance     program.  Test performance has     been validated by [employer] for patients greater     than or equal to 42 year old.     It is not intended     to diagnose infection nor to     guide or monitor treatment.     Assessment/Plan: S/P retropubic prostatectomy Advance diet Continue CBI  today   LOS: 1 day    Albert Hicks 08/04/2012, 6:43 AM

## 2012-08-05 MED ORDER — COLCHICINE 0.6 MG PO TABS
0.6000 mg | ORAL_TABLET | Freq: Every day | ORAL | Status: DC
  Administered 2012-08-05 – 2012-08-06 (×2): 0.6 mg via ORAL
  Filled 2012-08-05 (×2): qty 1

## 2012-08-05 MED ORDER — ACETAMINOPHEN 325 MG PO TABS
650.0000 mg | ORAL_TABLET | ORAL | Status: DC | PRN
  Administered 2012-08-05 – 2012-08-06 (×2): 650 mg via ORAL
  Filled 2012-08-05 (×2): qty 2

## 2012-08-05 NOTE — Progress Notes (Signed)
2 Days Post-Op Subjective: Patient reports Pain left ankle.  Unable to put weight on it. Tolerates diet well.  Objective: Vital signs in last 24 hours: Temp:  [99.3 F (37.4 C)-99.8 F (37.7 C)] 99.8 F (37.7 C) (05/15 1446) Pulse Rate:  [85-88] 85 (05/15 1446) Resp:  [18-19] 19 (05/15 1446) BP: (120-135)/(65-76) 120/65 mmHg (05/15 1446) SpO2:  [96 %-99 %] 97 % (05/15 1446)  Intake/Output from previous day: 05/14 0701 - 05/15 0700 In: 2360 [P.O.:360; I.V.:2000] Out: 8415 [Urine:8400; Drains:15] Intake/Output this shift: Total I/O In: 240 [P.O.:240] Out: 1900 [Urine:1900]  Physical Exam:  Abdomen:Soft, non distended, non tender.   Wound clean and dry. CBI running.  Urine pinkish Blake drainage: 15 cc Left ankle tender to touch. Mild swelling.   Lab Results:  Recent Labs  08/03/12 1230 08/03/12 1400 08/04/12 0447  HGB 8.8* 10.9* 11.5*  HCT 26.0* 32.8* 34.7*   BMET  Recent Labs  08/03/12 1230 08/04/12 0447  NA 137 137  K 4.2 4.3  CL  --  102  CO2  --  27  GLUCOSE 96 121*  BUN  --  11  CREATININE  --  1.09  CALCIUM  --  9.1   No results found for this basename: LABPT, INR,  in the last 72 hours No results found for this basename: LABURIN,  in the last 72 hours Results for orders placed during the hospital encounter of 07/27/12  SURGICAL PCR SCREEN     Status: None   Collection Time    07/27/12  1:12 PM      Result Value Range Status   MRSA, PCR NEGATIVE  NEGATIVE Final   Staphylococcus aureus NEGATIVE  NEGATIVE Final   Comment:            The Xpert SA Assay (FDA     approved for NASAL specimens     in patients over 49 years of age),     is one component of     a comprehensive surveillance     program.  Test performance has     been validated by [employer] for patients greater     than or equal to 40 year old.     It is not intended     to diagnose infection nor to     guide or monitor treatment.    Studies/Results: No results  found.  Assessment/Plan:  S/P open prostastectomy  Gout  Colchicine for pain  D/C CBI.  Probable discharge in AM   LOS: 2 days   Kalinda Romaniello-HENRY 08/05/2012, 6:21 PM

## 2012-08-06 LAB — URIC ACID: Uric Acid, Serum: 6.3 mg/dL (ref 4.0–7.8)

## 2012-08-06 MED ORDER — COLCHICINE 0.6 MG PO TABS: 0.6000 mg | ORAL_TABLET | Freq: Every day | ORAL | Status: DC

## 2012-08-06 MED ORDER — CIPROFLOXACIN HCL 250 MG PO TABS: 250.0000 mg | ORAL_TABLET | Freq: Two times a day (BID) | ORAL | Status: DC

## 2012-08-06 MED ORDER — DOCUSATE SODIUM 100 MG PO CAPS: 100.0000 mg | ORAL_CAPSULE | Freq: Two times a day (BID) | ORAL | Status: DC

## 2012-08-06 NOTE — Discharge Summary (Signed)
Physician Discharge Summary  Patient ID: Albert Hicks MRN: [id number] DOB/AGE: September 16, 1940 72 y.o.  Admit date: 08/03/2012 Discharge date: 08/06/2012  Admission Diagnoses:  Discharge Diagnoses:  Active Problems:   * No active hospital problems. *   Discharged Condition: Improved  Hospital Course:[name] was admitted on 5/13 for retropubic prostatectomy.  He had gross painless hematuria.  CT scan revealed left hydronephrosis.  Cystoscopy showed markedly enlarged prostate.  Ureteral orifices could not be identified.  He had left percutaneous nephrostomy and antegrade pyelogram that showed no ureteral stricture or filling defect.  He had a J hook deformity of the distal ureter.  The hydronephrosis resolved with JJ stent.  He had retropubic prostatectomy on 5/13.  The post-op course was uneventful.  He remained afebrile.  The urine gradually cleared up.  His Hgb was 11.5 on 5/14.  CBI was discontinued on 5/15.  Urine is clear. He tolerated his diet well. On 5/15 he had severe pain left ankle and great toe secondary to gout.  He was started on colchicine.  He still had pain left great toe but felt a little bit better. The Stow drain was removed.  He was discharged home on 5/16.  He will be followed in one week for catheter removal.   Consults: None  Significant Diagnostic Studies: Pathology report revealed BPH, no malignancy  Treatments:Simple retropubic prostatectomy  Discharge Exam: Blood pressure 132/67, pulse 78, temperature 99.9 F (37.7 C), temperature source Oral, resp. rate 18, height 6\' 1"  (1.854 m), weight 96.616 kg (213 lb), SpO2 99.00%. Abdomen: Soft, non distended, non tender.  Wound clean and dry.   Blake drainage: minimal Foley draining well.  Urine clear. Drain removed. Disposition: 01-Home or Self Care He is instructed not to do any lifting, straining or driving until further advice.      Medication List    TAKE these medications       acetaminophen 500 MG tablet   Commonly known as:  TYLENOL  Take 1,000 mg by mouth every 6 (six) hours as needed. For pain     amLODipine 5 MG tablet  Commonly known as:  NORVASC  Take 5 mg by mouth every morning.     ciprofloxacin 250 MG tablet  Commonly known as:  CIPRO  Take 1 tablet (250 mg total) by mouth 2 (two) times daily.     colchicine 0.6 MG tablet  Take 1 tablet (0.6 mg total) by mouth daily.     docusate sodium 100 MG capsule  Commonly known as:  COLACE  Take 1 capsule (100 mg total) by mouth 2 (two) times daily.     omega-3 acid ethyl esters 1 G capsule  Commonly known as:  LOVAZA  Take 2 g by mouth daily.         Signed: Melodi Happel-HENRY 08/06/2012, 9:23 AM

## 2012-08-26 DIAGNOSIS — N401 Enlarged prostate with lower urinary tract symptoms: Secondary | ICD-10-CM | POA: Diagnosis not present

## 2012-08-26 DIAGNOSIS — R350 Frequency of micturition: Secondary | ICD-10-CM | POA: Diagnosis not present

## 2012-08-26 DIAGNOSIS — N390 Urinary tract infection, site not specified: Secondary | ICD-10-CM | POA: Diagnosis not present

## 2012-09-09 DIAGNOSIS — N401 Enlarged prostate with lower urinary tract symptoms: Secondary | ICD-10-CM | POA: Diagnosis not present

## 2012-09-23 ENCOUNTER — Other Ambulatory Visit: Payer: Self-pay | Admitting: Physician Assistant

## 2012-09-23 DIAGNOSIS — N401 Enlarged prostate with lower urinary tract symptoms: Secondary | ICD-10-CM | POA: Diagnosis not present

## 2012-09-23 DIAGNOSIS — N1330 Unspecified hydronephrosis: Secondary | ICD-10-CM | POA: Diagnosis not present

## 2012-12-01 ENCOUNTER — Encounter: Payer: Self-pay | Admitting: Physician Assistant

## 2012-12-01 ENCOUNTER — Ambulatory Visit: Payer: Self-pay | Admitting: Physician Assistant

## 2012-12-01 ENCOUNTER — Ambulatory Visit (INDEPENDENT_AMBULATORY_CARE_PROVIDER_SITE_OTHER): Payer: Medicare Other | Admitting: Physician Assistant

## 2012-12-01 VITALS — BP 128/76 | HR 87 | Wt 210.0 lb

## 2012-12-01 DIAGNOSIS — M170 Bilateral primary osteoarthritis of knee: Secondary | ICD-10-CM

## 2012-12-01 DIAGNOSIS — M1710 Unilateral primary osteoarthritis, unspecified knee: Secondary | ICD-10-CM

## 2012-12-01 DIAGNOSIS — M25561 Pain in right knee: Secondary | ICD-10-CM

## 2012-12-01 MED ORDER — TRAMADOL HCL 50 MG PO TABS: ORAL_TABLET | ORAL | Status: DC

## 2012-12-01 NOTE — Patient Instructions (Addendum)
[facility] [facility] referral to be made.   Osteoarthritis Osteoarthritis is the most common form of arthritis. It is redness, soreness, and swelling (inflammation) affecting the cartilage. Cartilage acts as a cushion, covering the ends of bones where they meet to form a joint. CAUSES  Over time, the cartilage begins to wear away. This causes bone to rub on bone. This produces pain and stiffness in the affected joints. Factors that contribute to this problem are:  Excessive body weight.  Age.  Overuse of joints. SYMPTOMS   People with osteoarthritis usually experience joint pain, swelling, or stiffness.  Over time, the joint may lose its normal shape.  Small deposits of bone (osteophytes) may grow on the edges of the joint.  Bits of bone or cartilage can break off and float inside the joint space. This may cause more pain and damage.  Osteoarthritis can lead to depression, anxiety, feelings of helplessness, and limitations on daily activities. The most commonly affected joints are in the:  Ends of the fingers.  Thumbs.  Neck.  Lower back.  Knees.  Hips. DIAGNOSIS  Diagnosis is mostly based on your symptoms and exam. Tests may be helpful, including:  X-rays of the affected joint.  A computerized magnetic scan (MRI).  Blood tests to rule out other types of arthritis.  Joint fluid tests. This involves using a needle to draw fluid from the joint and examining the fluid under a microscope. TREATMENT  Goals of treatment are to control pain, improve joint function, maintain a normal body weight, and maintain a healthy lifestyle. Treatment approaches may include:  A prescribed exercise program with rest and joint relief.  Weight control with nutritional education.  Pain relief techniques such as:  Properly applied heat and cold.  Electric pulses delivered to nerve endings under the skin (transcutaneous electrical nerve stimulation, TENS).  Massage.  Certain  supplements. Ask your caregiver before using any supplements, especially in combination with prescribed drugs.  Medicines to control pain, such as:  Acetaminophen.  Nonsteroidal anti-inflammatory drugs (NSAIDs), such as naproxen.  Narcotic or central-acting agents, such as tramadol. This drug carries a risk of addiction and is generally prescribed for short-term use.  Corticosteroids. These can be given orally or as injection. This is a short-term treatment, not recommended for routine use.  Surgery to reposition the bones and relieve pain (osteotomy) or to remove loose pieces of bone and cartilage. Joint replacement may be needed in advanced states of osteoarthritis. HOME CARE INSTRUCTIONS  Your caregiver can recommend specific types of exercise. These may include:  Strengthening exercises. These are done to strengthen the muscles that support joints affected by arthritis. They can be performed with weights or with exercise bands to add resistance.  Aerobic activities. These are exercises, such as brisk walking or low-impact aerobics, that get your heart pumping. They can help keep your lungs and circulatory system in shape.  Range-of-motion activities. These keep your joints limber.  Balance and agility exercises. These help you maintain daily living skills. Learning about your condition and being actively involved in your care will help improve the course of your osteoarthritis. SEEK MEDICAL CARE IF:   You feel hot or your skin turns red.  You develop a rash in addition to your joint pain.  You have an oral temperature above 102 F (38.9 C). FOR MORE INFORMATION  National Institute of Arthritis and Musculoskeletal and Skin Diseases: www.niams.http://www.myers.net/ [employer] on Aging: https://walker.com/ American College of Rheumatology: www.rheumatology.org Document Released: 03/10/2005 Document Revised: 06/02/2011 Document  Reviewed: 06/21/2009 ExitCare Patient Information 2014  [place], [place].

## 2012-12-01 NOTE — Progress Notes (Signed)
  Subjective:    Patient ID: Albert Hicks, male    DOB: 1940-07-26, 72 y.o.   MRN: [id number]  HPI Patient is a 72 year old male who presents to the clinic with bilateral knee pain. Patient reports he has had knee pain for at least 10 years. Historically ibuprofen has helped significantly with pain. Over the past 2 years he has not been able to take ibuprofen due to chronic kidney disease from prostate obstruction. He has recently had part of his prostate removed but urologist has not released him to start taking any NSAIDs. He has tried glucosamine Conswella special with no relief. He would like referral to see an orthopedist. He does not like the idea of knee replacement surgery but would be interested in injections. He has never had any imaging. He denies any current trauma. Patient has a long history of athletics and sports training. He has been a runner most of his life.    Review of Systems     Objective:   Physical Exam  Constitutional: He appears well-developed and well-nourished.  Musculoskeletal:  Range of motion of right leg: Patient can fully extend right leg flexion is about at 120 degrees. Range of motion of left leg: Patient can fully extend left leg however leg flexion about 90. Bilaterally there was no joint tenderness. Crepitus was 2+.          Assessment & Plan:  Osteoarthritis/bilateral knee pain-discuss with patient option of going to Dr. Karie Schwalbe. in office. Patient declined and said he would rather see an orthopedist where he previously went for hip replacement. [facility] is where i sent referral. I did refrain from x-rays today since orthopedist might have opinion in which x-rays he would want to order. We did discuss options that would be presented to him to orthopedist such as cortisone injections versus starting with Synvisc/Supartz. We did discuss pros and cons of surgery in office today. Patient has OTC symptomatic relief. Patient is not permitted to take NSAIDs  right now. Did give tramadol to use for breakthrough pain.

## 2012-12-03 DIAGNOSIS — M1710 Unilateral primary osteoarthritis, unspecified knee: Secondary | ICD-10-CM | POA: Diagnosis not present

## 2013-01-05 DIAGNOSIS — M1710 Unilateral primary osteoarthritis, unspecified knee: Secondary | ICD-10-CM | POA: Diagnosis not present

## 2013-01-12 DIAGNOSIS — M1710 Unilateral primary osteoarthritis, unspecified knee: Secondary | ICD-10-CM | POA: Diagnosis not present

## 2013-01-19 DIAGNOSIS — M1710 Unilateral primary osteoarthritis, unspecified knee: Secondary | ICD-10-CM | POA: Diagnosis not present

## 2013-02-07 ENCOUNTER — Encounter: Payer: Self-pay | Admitting: Physician Assistant

## 2013-02-07 ENCOUNTER — Ambulatory Visit (INDEPENDENT_AMBULATORY_CARE_PROVIDER_SITE_OTHER): Payer: Medicare Other | Admitting: Physician Assistant

## 2013-02-07 VITALS — BP 150/79 | Temp 99.0°F | Wt 213.0 lb

## 2013-02-07 DIAGNOSIS — R05 Cough: Secondary | ICD-10-CM

## 2013-02-07 MED ORDER — HYDROCODONE-HOMATROPINE 5-1.5 MG/5ML PO SYRP: 5.0000 mL | ORAL_SOLUTION | Freq: Every evening | ORAL | Status: DC | PRN

## 2013-02-07 MED ORDER — BENZONATATE 200 MG PO CAPS: 200.0000 mg | ORAL_CAPSULE | Freq: Three times a day (TID) | ORAL | Status: DC | PRN

## 2013-02-07 NOTE — Patient Instructions (Signed)

## 2013-02-07 NOTE — Progress Notes (Signed)
  Subjective:    Patient ID: Albert Hicks, male    DOB: Apr 19, 1940, 72 y.o.   MRN: [id number]  HPI Patient is a 72 yo male who presents to the clinic with cough for last week and a half. Pt went to [place] to visit his daughter about 3 weeks ago. His daughter started coughing and then so did he. At the beginning he had some cold-like symptoms but they have resolved. Now he just has a dry cough that is ongoing. Worse at night. He works as a Therapist, sports and cannot cough on air. Tried robotussin and did not really help. Denies fever, chills, ST, ear pain, sinus pressure, SOB, or wheezing.     Review of Systems     Objective:   Physical Exam  Constitutional: He is oriented to person, place, and time. He appears well-developed and well-nourished.  HENT:  Head: Normocephalic and atraumatic.  Right Ear: External ear normal.  Left Ear: External ear normal.  Nose: Nose normal.  Mouth/Throat: Oropharynx is clear and moist.  Eyes: Conjunctivae are normal.  Neck: Normal range of motion. Neck supple.  Cardiovascular: Normal rate, regular rhythm and normal heart sounds.   Pulmonary/Chest: Effort normal and breath sounds normal. He has no wheezes.  Lymphadenopathy:    He has no cervical adenopathy.  Neurological: He is alert and oriented to person, place, and time.  Skin: Skin is warm and dry.  Psychiatric: He has a normal mood and affect. His behavior is normal.          Assessment & Plan:  Post viral cough- Gave hycodan to use as night and tessalone pearls to use during the day. Consider cough drops and honey to help soothe cough reflex. Call if not improving or suddenly worsening.

## 2013-03-03 DIAGNOSIS — M1710 Unilateral primary osteoarthritis, unspecified knee: Secondary | ICD-10-CM | POA: Diagnosis not present

## 2013-03-21 DIAGNOSIS — R972 Elevated prostate specific antigen [PSA]: Secondary | ICD-10-CM | POA: Diagnosis not present

## 2013-03-28 DIAGNOSIS — N401 Enlarged prostate with lower urinary tract symptoms: Secondary | ICD-10-CM | POA: Diagnosis not present

## 2013-03-28 DIAGNOSIS — N139 Obstructive and reflux uropathy, unspecified: Secondary | ICD-10-CM | POA: Diagnosis not present

## 2013-04-26 ENCOUNTER — Other Ambulatory Visit: Payer: Self-pay | Admitting: Physician Assistant

## 2013-05-04 DIAGNOSIS — H18509 Unspecified hereditary corneal dystrophies, unspecified eye: Secondary | ICD-10-CM | POA: Diagnosis not present

## 2013-05-27 ENCOUNTER — Other Ambulatory Visit: Payer: Self-pay | Admitting: Physician Assistant

## 2013-07-01 ENCOUNTER — Telehealth: Payer: Self-pay | Admitting: Physician Assistant

## 2013-07-01 ENCOUNTER — Other Ambulatory Visit: Payer: Self-pay | Admitting: Physician Assistant

## 2013-07-01 NOTE — Telephone Encounter (Signed)
I am just reviewing your chart and see that Livalo was not in med list. Are you still taking this? LDL was 178 and with age and other risk factors important to be on a statin. I know you did not tolerate lipitor but did you tolerate livalo?

## 2013-07-05 MED ORDER — PITAVASTATIN CALCIUM 2 MG PO TABS: 2.0000 mg | ORAL_TABLET | Freq: Every day | ORAL | Status: DC

## 2013-07-05 NOTE — Telephone Encounter (Signed)
Pt is not currently taking this but did tolerate it well.  He is ok with you sending over to his pharmacy so he can get back on it.

## 2013-07-05 NOTE — Telephone Encounter (Signed)
Ordered

## 2013-07-05 NOTE — Telephone Encounter (Signed)
Ok to send to pharmacy Livalo 2mg  daily #30 refill 6. Make sure pt has card to make cheaper.

## 2013-07-15 ENCOUNTER — Encounter: Payer: Self-pay | Admitting: Physician Assistant

## 2013-07-15 ENCOUNTER — Ambulatory Visit (INDEPENDENT_AMBULATORY_CARE_PROVIDER_SITE_OTHER): Payer: Medicare Other | Admitting: Physician Assistant

## 2013-07-15 ENCOUNTER — Ambulatory Visit (INDEPENDENT_AMBULATORY_CARE_PROVIDER_SITE_OTHER): Payer: Medicare Other

## 2013-07-15 VITALS — BP 139/72 | HR 86 | Ht 73.0 in | Wt 220.0 lb

## 2013-07-15 DIAGNOSIS — M19011 Primary osteoarthritis, right shoulder: Secondary | ICD-10-CM

## 2013-07-15 DIAGNOSIS — M19012 Primary osteoarthritis, left shoulder: Principal | ICD-10-CM

## 2013-07-15 DIAGNOSIS — M25511 Pain in right shoulder: Secondary | ICD-10-CM

## 2013-07-15 DIAGNOSIS — M898X9 Other specified disorders of bone, unspecified site: Secondary | ICD-10-CM

## 2013-07-15 DIAGNOSIS — M25519 Pain in unspecified shoulder: Secondary | ICD-10-CM

## 2013-07-15 DIAGNOSIS — M19019 Primary osteoarthritis, unspecified shoulder: Secondary | ICD-10-CM

## 2013-07-15 DIAGNOSIS — R9389 Abnormal findings on diagnostic imaging of other specified body structures: Secondary | ICD-10-CM

## 2013-07-15 DIAGNOSIS — M25512 Pain in left shoulder: Secondary | ICD-10-CM

## 2013-07-15 MED ORDER — TRAMADOL HCL 50 MG PO TABS: ORAL_TABLET | ORAL | Status: DC

## 2013-07-15 NOTE — Patient Instructions (Addendum)
Will call Dr. Kellie Simmering and find out about NSAID gels.  Consider PT.  Will give home exercises.   Bilateral injections given today.  Keep moving arm.  Tramadol as needed for pain.   Adhesive Capsulitis Sometimes the shoulder becomes stiff and is painful to move. Some people say it feels as if the shoulder is frozen in place. Because of this, the condition is called "frozen shoulder." Its medical name is adhesive capsulitis.  The shoulder joint is made up of strong connective tissue that attaches the ball of the humerus to the shallow shoulder socket. This strong connective tissue is called the joint capsule. This tissue can become stiff and swollen. That is when adhesive capsulitis sets in. CAUSES  It is not always clear just what the cause adhesive capsulitis. Possibilities include:  Injury to the shoulder joint.  Strain. This is a repetitive injury brought about by overuse.  Lack of use. Perhaps your arm or hand was otherwise injured. It might have been in a sling for awhile. Or perhaps you were not using it to avoid pain.  Referred pain. This is a sort of trick the body plays. You feel pain in the shoulder. But, the pain actually comes from an injury somewhere else in the body.  Long-standing health problems. Several diseases can cause adhesive capsulitis. They include diabetes, heart disease, stroke, thyroid problems, rheumatoid arthritis and lung disease.  Being a women older than 26. Anyone can develop adhesive capsulitis but it is most common in women in this age group. SYMPTOMS   Pain.  It occurs when the arm is moved.  Parts of the shoulder might hurt if they are touched.  Pain is worse at night or when resting.  Soreness. It might not be strong enough to be called pain. But, the shoulder aches.  The shoulder does not move freely.  Muscle spasms.  Trouble sleeping because of shoulder ache or pain. DIAGNOSIS  To decide if you have adhesive capsulitis, your healthcare  provider will probably:  Ask about symptoms you have noticed.  Ask about your history of joint pain and anything that might have caused the pain.  Ask about your overall health.  Use hands to feel your shoulder and neck.  Ask you to move your shoulder in specific directions. This may indicate the origin of the pain.  Order imaging tests; pictures of the shoulder. They help pinpoint the source of the problem. An X-ray might be used. For more detail, an MRI is often used. An MRI details the tendons, muscles and ligaments as well as the joint. TREATMENT  Adhesive capsulitis can be treated several ways. Most treatments can be done in a clinic or in your healthcare provider's office. Be sure to discuss the different options with your caregiver. They include:  Physical therapy. You will work on specific exercises to get your shoulder moving again. The exercises usually involve stretching. A physical therapist (a caregiver with special training) can show you what to do and what not to do. The exercises will need to be done daily.  Medication.  Over-the-counter medicines may relieve pain and inflammation (the body's way of reacting to injury or infection).  Corticosteroids. These are stronger drugs to reduce pain and inflammation. They are given by injection (shots) into the shoulder joint. Frequent treatment is not recommended.  Muscle relaxants. Medication may be prescribed to ease muscle spasms.  Treatment of underlying conditions. This means treating another condition that is causing your shoulder problem. This might be a  rotator cuff (tendon) problem  Shoulder manipulation. The shoulder will be moved by your healthcare provider. You would be under general anesthesia (given a drug that puts you to sleep). You would not feel anything. Sometimes the joint will be injected with salt water (saline) at high pressure to break down internal scarring in the joint capsule.  Surgery. This is rarely  needed. It may be suggested in advanced cases after all other treatment has failed. PROGNOSIS  In time, most people recover from adhesive capsulitis. Sometimes, however, the pain goes away but full movement of the shoulder does not return.  HOME CARE INSTRUCTIONS   Take any pain medications recommended by your healthcare provider. Follow the directions carefully.  If you have physical therapy, follow through with the therapist's suggestions. Be sure you understand the exercises you will be doing. You should understand:  How often the exercises should be done.  How many times each exercise should be repeated.  How long they should be done.  What other activities you should do, or not do.  That you should warm up before doing any exercise. Just 5 to 10 minutes will help. Small, gentle movements should get your shoulder ready for more.  Avoid high-demand exercise that involves your shoulder such as throwing. This type of exercise can make pain worse.  Consider using cold packs. Cold may ease swelling and pain. Ask your healthcare provider if a cold pack might help you. If so, get directions on how and when to use them. SEEK MEDICAL CARE IF:   You have any questions about your medications.  Your pain continues to increase. Document Released: 01/05/2009 Document Revised: 06/02/2011 Document Reviewed: 01/05/2009 [employer] Patient Information 2014 [place], [place].

## 2013-07-15 NOTE — Progress Notes (Signed)
   Subjective:    Patient ID: Albert Hicks, male    DOB: 08/13/1940, 73 y.o.   MRN: [id number]  HPI Patient presents to the clinic with bilateral shoulder pain. He has had this pain for over a year but seems to be worsening. Pain has increased at night. He has a lot of stiffness during the day but at night is when the pain occurs. He is still able to golf and lift weights. Range of motion of bilateral shoulders have decreased significantly. He has well-known osteoarthritis of bilateral knees. Tylenol helps some. Ibuprofen helps a lot but cannot take due to CKD. Dr. Janice Norrie stated kidneys looked great but does not want him on NSAIDs. At night pain is throbbing. No known injury.   .    Review of Systems     Objective:   Physical Exam  Constitutional: He is oriented to person, place, and time. He appears well-developed and well-nourished.  HENT:  Head: Normocephalic and atraumatic.  Cardiovascular: Normal rate, regular rhythm and normal heart sounds.   Pulmonary/Chest: Effort normal and breath sounds normal.  Musculoskeletal:  Limited ROM due to pain and stiffness 110 degrees ROM. No pain to palpation over the acromion, clavicle, coracoid process. Strength 5/5 bilateral upper extremities. Hand grip normal and strong bilaterally. Pain and stiffness with external and internal range of motion more with external. Empty can sign negative.  Neurological: He is alert and oriented to person, place, and time.  Skin: Skin is dry.  Psychiatric: He has a normal mood and affect. His behavior is normal.          Assessment & Plan:  Bilateral shoulder pain/osteoarhritis of bilateral shoulders- Xray confirm advanced arthritis in both shoulders. Pt not able to take NSAIDs due to CKD and Dr. Janice Norrie recommendation. Will call nephrologist and see if voltaran gels would be warranted with proper monitoring. Will give bilateral shoulder injections today. Follow up with Dr. Darene Lamer if not improving for Ultrasound guided  joint injections. Tramadol given for breakthrough pain.   Shoulder Injection Procedure Note  Pre-operative Diagnosis: bilateral shoulders  Post-operative Diagnosis: same  Indications: Symptom relief from osteoarthritis  Anesthesia: ethly chloride  Procedure Details   Verbal consent was obtained for the procedure. The shoulder was prepped with iodine and the skin was anesthetized. Using a 22 gauge needle the glenohumeral joint is injected with 9 mL 1% lidocaine and 1 mL of 40mg  depo medrol under the posterior aspect of the acromion. The injection site was cleansed with topical isopropyl alcohol and a dressing was applied.  Complications:  None; patient tolerated the procedure well.

## 2013-07-18 ENCOUNTER — Telehealth: Payer: Self-pay | Admitting: Physician Assistant

## 2013-07-18 NOTE — Telephone Encounter (Signed)
Nurse called back from Dr. Sammie Bench office stating that his PCP needed to approve these meds.

## 2013-07-18 NOTE — Telephone Encounter (Signed)
Will you call Dr. Janice Norrie, nephrologist office and ask if pt could try voltaran gel or any other creams with NSAIDs in it for osteoarthritis. Aware told not to take NSAIDs. Pt has advanced OA in bilateral knees and shoulders and responds well to ibuprofen but cannot take due to kidneys.

## 2013-07-19 NOTE — Telephone Encounter (Signed)
Please call back and let them know we are his PCP and we are consulting Dr. Janice Norrie specialized opinion in nephrology if since he can not take oral NSAIDs if he thinks a trial of topical NSAIDS is appropriate.

## 2013-07-21 NOTE — Telephone Encounter (Signed)
Nesi is his urologist & I spoke with Rod Holler & she said that they only treat him for BPH & their clearance is irrelevant.

## 2013-07-22 ENCOUNTER — Other Ambulatory Visit: Payer: Self-pay | Admitting: Physician Assistant

## 2013-07-22 MED ORDER — DICLOFENAC SODIUM 1 % TD GEL: 2.0000 g | Freq: Four times a day (QID) | TRANSDERMAL | Status: DC

## 2013-07-22 NOTE — Telephone Encounter (Signed)
Call pt: will try voltaren gel and repeat kidney function test in 1 month to see if affecting kidneys.

## 2013-08-01 NOTE — Telephone Encounter (Signed)
Left detailed message, and for patient to call.

## 2013-08-04 ENCOUNTER — Other Ambulatory Visit: Payer: Self-pay | Admitting: Physician Assistant

## 2013-09-05 ENCOUNTER — Other Ambulatory Visit: Payer: Self-pay | Admitting: Physician Assistant

## 2013-09-26 ENCOUNTER — Other Ambulatory Visit: Payer: Self-pay | Admitting: Physician Assistant

## 2013-09-26 NOTE — Telephone Encounter (Signed)
Patient is requesting refill. Margette Fast, CMA

## 2013-09-27 NOTE — Telephone Encounter (Signed)
I do not see a medication requested?

## 2013-10-07 ENCOUNTER — Other Ambulatory Visit: Payer: Self-pay | Admitting: Physician Assistant

## 2013-10-26 ENCOUNTER — Encounter: Payer: Self-pay | Admitting: Physician Assistant

## 2013-10-26 ENCOUNTER — Ambulatory Visit (INDEPENDENT_AMBULATORY_CARE_PROVIDER_SITE_OTHER): Payer: Medicare Other | Admitting: Physician Assistant

## 2013-10-26 VITALS — BP 119/68 | HR 91 | Ht 73.0 in | Wt 210.0 lb

## 2013-10-26 DIAGNOSIS — N183 Chronic kidney disease, stage 3 unspecified: Secondary | ICD-10-CM

## 2013-10-26 DIAGNOSIS — M19012 Primary osteoarthritis, left shoulder: Principal | ICD-10-CM

## 2013-10-26 DIAGNOSIS — M19019 Primary osteoarthritis, unspecified shoulder: Secondary | ICD-10-CM | POA: Diagnosis not present

## 2013-10-26 DIAGNOSIS — M19011 Primary osteoarthritis, right shoulder: Secondary | ICD-10-CM

## 2013-10-26 MED ORDER — TRAMADOL HCL 50 MG PO TABS: ORAL_TABLET | ORAL | Status: DC

## 2013-10-26 MED ORDER — COLCHICINE 0.6 MG PO TABS: 0.6000 mg | ORAL_TABLET | Freq: Every day | ORAL | Status: DC

## 2013-10-26 MED ORDER — DICLOFENAC SODIUM 1 % TD GEL: TRANSDERMAL | Status: DC

## 2013-10-27 DIAGNOSIS — H40019 Open angle with borderline findings, low risk, unspecified eye: Secondary | ICD-10-CM | POA: Diagnosis not present

## 2013-10-27 LAB — BASIC METABOLIC PANEL WITH GFR
BUN: 19 mg/dL (ref 6–23)
CO2: 27 mEq/L (ref 19–32)
Calcium: 9.8 mg/dL (ref 8.4–10.5)
Chloride: 105 mEq/L (ref 96–112)
Creat: 1.51 mg/dL — ABNORMAL HIGH (ref 0.50–1.35)
GFR, Est African American: 53 mL/min — ABNORMAL LOW
GFR, Est Non African American: 45 mL/min — ABNORMAL LOW
Glucose, Bld: 96 mg/dL (ref 70–99)
Potassium: 4.9 mEq/L (ref 3.5–5.3)
Sodium: 140 mEq/L (ref 135–145)

## 2013-10-28 NOTE — Progress Notes (Addendum)
   Subjective:    Patient ID: Albert Hicks, male    DOB: 1940/09/19, 73 y.o.   MRN: [id number]  HPI Pt presents to the clinic to follow up on bilateral shoulder osteoarthritis. Pt got significant improvement from last steroid injection into posterior shoulder 4 months ago. He would like more injections. Pt has been told by nephrology NOT to take NSAIDS but they are the only thing that helps and he has been taking them as needed. When he does take them it is 600mg  once. He does get some relief from Voltaren gel.    Review of Systems  All other systems reviewed and are negative.      Objective:   Physical Exam  Constitutional: He appears well-developed and well-nourished.  HENT:  Head: Normocephalic and atraumatic.  Cardiovascular: Normal rate, regular rhythm and normal heart sounds.   Pulmonary/Chest: Effort normal and breath sounds normal.  Musculoskeletal:  Continues to have limited ROM of bilateral shoulders due to pain.   Psychiatric: He has a normal mood and affect. His behavior is normal.          Assessment & Plan:  Osteoarthritis of bilateral shoulders- will check bmp to see if pt taking NSAIDs is effecting his kidney function.  Will give steroid injections today. Xrays confirmed significant osteoarthritis. Continue using voltaren gel for pain.   Shoulder Injection Procedure Note  Pre-operative Diagnosis: bilateral shoulder pain/osteoarthritis  Post-operative Diagnosis: same  Indications: Symptom relief from osteoarthritis   Anesthesia: ethyl chloride   Procedure Details   Verbal consent was obtained for the procedure. Bilateral shoulders was prepped with iodine and the skin was anesthetized. Using a 22 gauge needle the glenohumeral joint of both shoulders is injected with 9 mL 1% lidocaine and 1 mL of depo medrol 40 under the posterior aspect of the acromion. The injection site was cleansed with topical isopropyl alcohol and a dressing was applied.  Complications:   None; patient tolerated the procedure well.

## 2013-11-04 ENCOUNTER — Other Ambulatory Visit: Payer: Self-pay | Admitting: Physician Assistant

## 2013-11-23 ENCOUNTER — Ambulatory Visit: Payer: Self-pay | Admitting: Physician Assistant

## 2013-11-24 DIAGNOSIS — N138 Other obstructive and reflux uropathy: Secondary | ICD-10-CM | POA: Diagnosis not present

## 2013-11-24 DIAGNOSIS — N401 Enlarged prostate with lower urinary tract symptoms: Secondary | ICD-10-CM | POA: Diagnosis not present

## 2013-11-24 DIAGNOSIS — N1330 Unspecified hydronephrosis: Secondary | ICD-10-CM | POA: Diagnosis not present

## 2013-11-24 DIAGNOSIS — N289 Disorder of kidney and ureter, unspecified: Secondary | ICD-10-CM | POA: Diagnosis not present

## 2013-12-05 ENCOUNTER — Other Ambulatory Visit: Payer: Self-pay | Admitting: Physician Assistant

## 2014-01-02 ENCOUNTER — Other Ambulatory Visit: Payer: Self-pay | Admitting: Physician Assistant

## 2014-01-27 ENCOUNTER — Other Ambulatory Visit: Payer: Self-pay | Admitting: Physician Assistant

## 2014-02-27 ENCOUNTER — Other Ambulatory Visit: Payer: Self-pay | Admitting: Physician Assistant

## 2014-04-04 DIAGNOSIS — N529 Male erectile dysfunction, unspecified: Secondary | ICD-10-CM | POA: Diagnosis not present

## 2014-04-04 DIAGNOSIS — N401 Enlarged prostate with lower urinary tract symptoms: Secondary | ICD-10-CM | POA: Diagnosis not present

## 2014-04-04 DIAGNOSIS — N289 Disorder of kidney and ureter, unspecified: Secondary | ICD-10-CM | POA: Diagnosis not present

## 2014-04-04 LAB — BASIC METABOLIC PANEL
BUN: 15 mg/dL (ref 4–21)
Creatinine: 1.2 mg/dL (ref 0.6–1.3)
Potassium: 4.6 mmol/L (ref 3.4–5.3)
Sodium: 138 mmol/L (ref 137–147)

## 2014-04-04 LAB — HEPATIC FUNCTION PANEL
ALT: 20 U/L (ref 10–40)
AST: 19 U/L (ref 14–40)
Alkaline Phosphatase: 59 U/L (ref 25–125)
Bilirubin, Total: 0.4 mg/dL

## 2014-04-05 LAB — VITAMIN D 25 HYDROXY (VIT D DEFICIENCY, FRACTURES): Vit D, 25-Hydroxy: 37

## 2014-04-05 LAB — COMPLETE METABOLIC PANEL WITH GFR
Albumin: 4.1
Calcium: 9.5 mg/dL
EGFR (African American): 67
EGFR (Non-African Amer.): 58
Total Protein: 7.2 g/dL

## 2014-04-07 ENCOUNTER — Encounter: Payer: Self-pay | Admitting: Physician Assistant

## 2014-04-07 ENCOUNTER — Ambulatory Visit (INDEPENDENT_AMBULATORY_CARE_PROVIDER_SITE_OTHER): Payer: Medicare Other | Admitting: Physician Assistant

## 2014-04-07 VITALS — BP 132/66 | HR 86 | Ht 73.0 in | Wt 214.0 lb

## 2014-04-07 DIAGNOSIS — E785 Hyperlipidemia, unspecified: Secondary | ICD-10-CM | POA: Diagnosis not present

## 2014-04-07 DIAGNOSIS — I10 Essential (primary) hypertension: Secondary | ICD-10-CM

## 2014-04-07 DIAGNOSIS — N183 Chronic kidney disease, stage 3 unspecified: Secondary | ICD-10-CM

## 2014-04-07 MED ORDER — PITAVASTATIN CALCIUM 2 MG PO TABS: 2.0000 mg | ORAL_TABLET | Freq: Every day | ORAL | Status: DC

## 2014-04-07 NOTE — Progress Notes (Addendum)
   Subjective:    Patient ID: Albert Hicks, male    DOB: 1941-01-15, 74 y.o.   MRN: [id number]  HPI Patient is a 74 year old male who has chronic kidney disease stage III who reports increase in blood pressure over the last couple months when checked at doctors offices. He went to both the dentist and his urologist and his blood pressure was over 155/90. He was concerned and wanted to follow-up today. He denies any chest pain, palpitations, vision changes or headaches. No peripheral swelling. Urologist gave him clean bill of health as far as prostate goes. Labs were also ran for kidney function.    Review of Systems  All other systems reviewed and are negative.      Objective:   Physical Exam  Constitutional: He is oriented to person, place, and time. He appears well-developed and well-nourished.  HENT:  Head: Normocephalic and atraumatic.  Cardiovascular: Normal rate, regular rhythm and normal heart sounds.   Pulmonary/Chest: Effort normal and breath sounds normal. He has no wheezes.  Neurological: He is alert and oriented to person, place, and time.  Skin: Skin is dry.  Psychiatric: He has a normal mood and affect. His behavior is normal.          Assessment & Plan:  Hypertension/CK D stage III-per patient he had his kidney function test at urologist office on Monday and they're supposed to be sending labs. Amber called and release was signed. Blood pressure was better than has been but still running around 140/90. With his current kidney condition I would have for slightly lower blood pressure. I did increase his Norvasc today to 10 mg daily. I did give him a blood pressure cuff here in the office to start checking his blood pressure regularly and keeping a log. He is to call with readings in the next week or so. If he is tolerating the increased dose will send over Norvasc 10 mg to the pharmacy daily. Follow up BP recheck in 3 months.   Got labs Scr 1.24. GFR 67, BUN 15,  Vitamin D  37. Very stable and look great.   Hyperlipidemia-patient has not been on his livalo just because he ran out of his prescription. Discussed importance of LDL lowering medication and statins. Patient will restart medication and we will recheck at complete physical in the next 6 months.  Patient declined flu shot.  Discussed the importance of pneumonia vaccine both prevnar to start and then pneumoncocall 23. I did provide a handout for patient to read. Currently he does declined a shot. Hopefully by a complete physical we will be able to start administering the recommended vaccine.

## 2014-04-07 NOTE — Patient Instructions (Addendum)
Increase norvasc to 10mg (2 tablets of what you have) restrart livalo.   Pneumococcal Vaccine, Polyvalent suspension for injection What is this medicine? PNEUMOCOCCAL VACCINE, POLYVALENT (NEU mo KOK al vak SEEN, pol ee VEY luhnt) is a vaccine to prevent pneumococcus bacteria infection. These bacteria are a major cause of ear infections, 'Strep throat' infections, and serious pneumonia, meningitis, or blood infections worldwide. These vaccines help the body to produce antibodies (protective substances) that help your body defend against these bacteria. This vaccine is recommended for infants and young children. This vaccine will not treat an infection. This medicine may be used for other purposes; ask your health care provider or pharmacist if you have questions. COMMON BRAND NAME(S): Prevnar 13 What should I tell my health care provider before I take this medicine? They need to know if you have any of these conditions: -bleeding problems -fever -immune system problems -low platelet count in the blood -seizures -an unusual or allergic reaction to pneumococcal vaccine, diphtheria toxoid, other vaccines, latex, other medicines, foods, dyes, or preservatives -pregnant or trying to get pregnant -breast-feeding How should I use this medicine? This vaccine is for injection into a muscle. It is given by a health care professional. A copy of Vaccine Information Statements will be given before each vaccination. Read this sheet carefully each time. The sheet may change frequently. Talk to your pediatrician regarding the use of this medicine in children. While this drug may be prescribed for children as young as 64 weeks old for selected conditions, precautions do apply. Overdosage: If you think you have taken too much of this medicine contact a poison control center or emergency room at once. NOTE: This medicine is only for you. Do not share this medicine with others. What if I miss a dose? It is  important not to miss your dose. Call your doctor or health care professional if you are unable to keep an appointment. What may interact with this medicine? -medicines for cancer chemotherapy -medicines that suppress your immune function -medicines that treat or prevent blood clots like warfarin, enoxaparin, and dalteparin -steroid medicines like prednisone or cortisone This list may not describe all possible interactions. Give your health care provider a list of all the medicines, herbs, non-prescription drugs, or dietary supplements you use. Also tell them if you smoke, drink alcohol, or use illegal drugs. Some items may interact with your medicine. What should I watch for while using this medicine? Mild fever and pain should go away in 3 days or less. Report any unusual symptoms to your doctor or health care professional. What side effects may I notice from receiving this medicine? Side effects that you should report to your doctor or health care professional as soon as possible: -allergic reactions like skin rash, itching or hives, swelling of the face, lips, or tongue -breathing problems -confused -fever over 102 degrees F -pain, tingling, numbness in the hands or feet -seizures -unusual bleeding or bruising -unusual muscle weakness Side effects that usually do not require medical attention (report to your doctor or health care professional if they continue or are bothersome): -aches and pains -diarrhea -fever of 102 degrees F or less -headache -irritable -loss of appetite -pain, tender at site where injected -trouble sleeping This list may not describe all possible side effects. Call your doctor for medical advice about side effects. You may report side effects to FDA at 1-800-FDA-1088. Where should I keep my medicine? This does not apply. This vaccine is given in a clinic, pharmacy, doctor's office,  or other health care setting and will not be stored at home. NOTE: This sheet  is a summary. It may not cover all possible information. If you have questions about this medicine, talk to your doctor, pharmacist, or health care provider.  2015, [employer]. (2008-05-23 10:17:22)

## 2014-04-10 ENCOUNTER — Telehealth: Payer: Self-pay | Admitting: Physician Assistant

## 2014-04-10 NOTE — Telephone Encounter (Signed)
Voicemail left for patient and then he returned call and then he was advised of lab levels.

## 2014-04-10 NOTE — Telephone Encounter (Signed)
Ok to continue vitamin D supplement in normal range but 37 on a 100 scale.  Kidney function looks great better than normal. Labs look wonderful. Will scan in. Follow up on BP as discussed in office at visit.

## 2014-04-13 DIAGNOSIS — D122 Benign neoplasm of ascending colon: Secondary | ICD-10-CM | POA: Diagnosis not present

## 2014-04-13 DIAGNOSIS — K5730 Diverticulosis of large intestine without perforation or abscess without bleeding: Secondary | ICD-10-CM | POA: Diagnosis not present

## 2014-04-13 DIAGNOSIS — Z86010 Personal history of colonic polyps: Secondary | ICD-10-CM | POA: Diagnosis not present

## 2014-04-17 ENCOUNTER — Encounter: Payer: Self-pay | Admitting: Physician Assistant

## 2014-04-17 DIAGNOSIS — K635 Polyp of colon: Secondary | ICD-10-CM | POA: Insufficient documentation

## 2014-04-24 ENCOUNTER — Other Ambulatory Visit: Payer: Self-pay | Admitting: *Deleted

## 2014-04-24 MED ORDER — AMLODIPINE BESYLATE 10 MG PO TABS: 10.0000 mg | ORAL_TABLET | Freq: Every day | ORAL | Status: DC

## 2014-04-30 ENCOUNTER — Encounter: Payer: Self-pay | Admitting: Emergency Medicine

## 2014-05-01 DIAGNOSIS — H40013 Open angle with borderline findings, low risk, bilateral: Secondary | ICD-10-CM | POA: Diagnosis not present

## 2014-05-02 ENCOUNTER — Ambulatory Visit (INDEPENDENT_AMBULATORY_CARE_PROVIDER_SITE_OTHER): Payer: Medicare Other | Admitting: Physician Assistant

## 2014-05-02 ENCOUNTER — Encounter: Payer: Self-pay | Admitting: Physician Assistant

## 2014-05-02 VITALS — BP 132/73 | HR 90 | Ht 72.0 in | Wt 210.0 lb

## 2014-05-02 DIAGNOSIS — Z0000 Encounter for general adult medical examination without abnormal findings: Secondary | ICD-10-CM | POA: Diagnosis not present

## 2014-05-02 DIAGNOSIS — E785 Hyperlipidemia, unspecified: Secondary | ICD-10-CM | POA: Diagnosis not present

## 2014-05-02 DIAGNOSIS — M19011 Primary osteoarthritis, right shoulder: Secondary | ICD-10-CM

## 2014-05-02 DIAGNOSIS — I10 Essential (primary) hypertension: Secondary | ICD-10-CM

## 2014-05-02 DIAGNOSIS — R9431 Abnormal electrocardiogram [ECG] [EKG]: Secondary | ICD-10-CM | POA: Diagnosis not present

## 2014-05-02 DIAGNOSIS — M170 Bilateral primary osteoarthritis of knee: Secondary | ICD-10-CM

## 2014-05-02 DIAGNOSIS — M19012 Primary osteoarthritis, left shoulder: Secondary | ICD-10-CM

## 2014-05-02 MED ORDER — AMBULATORY NON FORMULARY MEDICATION: Status: DC

## 2014-05-02 MED ORDER — DICLOFENAC SODIUM 1 % TD GEL: 4.0000 g | Freq: Four times a day (QID) | TRANSDERMAL | Status: DC

## 2014-05-02 NOTE — Progress Notes (Addendum)
Subjective:    Albert Hicks is a 74 y.o. male who presents for Medicare Annual/Subsequent preventive examination.   Preventive Screening-Counseling & Management  Tobacco History  Smoking status  . Never Smoker   Smokeless tobacco  . Never Used    Problems Prior to Visit 1. Needs refill on voltaren gel for osteoarthritis pain. Only uses as needed due to CKD.   Current Problems (verified) Patient Active Problem List   Diagnosis Date Noted  . Colon polyp 04/17/2014  . Osteoarthritis of both shoulders 07/15/2013  . Osteoarthritis of both knees 12/01/2012  . BPH (benign prostatic hypertrophy) with urinary obstruction 07/03/2012  . History of gout 06/14/2012  . Diarrhea 05/11/2012  . Pyelonephritis 05/09/2012  . Hypokalemia 05/09/2012  . Sepsis due to urinary tract infection 05/09/2012  . Hyponatremia 05/09/2012  . Leukocytosis, unspecified 05/09/2012  . CKD (chronic kidney disease) stage 3, GFR 30-59 ml/min 03/23/2012  . Obstruction of kidney 02/23/2012  . Hyperlipidemia   . Hypertension     Medications Prior to Visit Current Outpatient Prescriptions on File Prior to Visit  Medication Sig Dispense Refill  . acetaminophen (TYLENOL) 500 MG tablet Take 1,000 mg by mouth every 6 (six) hours as needed. For pain    . amLODipine (NORVASC) 10 MG tablet Take 1 tablet (10 mg total) by mouth daily. 30 tablet 2  . colchicine 0.6 MG tablet Take 1 tablet (0.6 mg total) by mouth daily. 60 tablet 2  . diclofenac sodium (VOLTAREN) 1 % GEL Apply 4 g topically 4 (four) times daily.    [name] Kitchen docusate sodium (COLACE) 100 MG capsule Take 1 capsule (100 mg total) by mouth 2 (two) times daily. 10 capsule 0  . omega-3 acid ethyl esters (LOVAZA) 1 G capsule Take 2 g by mouth daily.    . Pitavastatin Calcium 2 MG TABS Take 1 tablet (2 mg total) by mouth daily. 30 tablet 6  . traMADol (ULTRAM) 50 MG tablet Take one tablet of tramadol at night as needed for bilateral shoulder pain up to twice a day. 30  tablet 0   No current facility-administered medications on file prior to visit.    Current Medications (verified) Current Outpatient Prescriptions  Medication Sig Dispense Refill  . acetaminophen (TYLENOL) 500 MG tablet Take 1,000 mg by mouth every 6 (six) hours as needed. For pain    . amLODipine (NORVASC) 10 MG tablet Take 1 tablet (10 mg total) by mouth daily. 30 tablet 2  . colchicine 0.6 MG tablet Take 1 tablet (0.6 mg total) by mouth daily. 60 tablet 2  . diclofenac sodium (VOLTAREN) 1 % GEL Apply 4 g topically 4 (four) times daily.    [name] Kitchen docusate sodium (COLACE) 100 MG capsule Take 1 capsule (100 mg total) by mouth 2 (two) times daily. 10 capsule 0  . omega-3 acid ethyl esters (LOVAZA) 1 G capsule Take 2 g by mouth daily.    . Pitavastatin Calcium 2 MG TABS Take 1 tablet (2 mg total) by mouth daily. 30 tablet 6  . traMADol (ULTRAM) 50 MG tablet Take one tablet of tramadol at night as needed for bilateral shoulder pain up to twice a day. 30 tablet 0  . AMBULATORY NON FORMULARY MEDICATION Zostavax IM once for shingles prevention. 1 application 0   No current facility-administered medications for this visit.     Allergies (verified) Lipitor and Tramadol   PAST HISTORY  Family History Family History  Problem Relation Age of Onset  . Hypertension Mother   .  Cancer Father     prostate  . Hypertension Father   . Diabetes Father     Social History History  Substance Use Topics  . Smoking status: Never Smoker   . Smokeless tobacco: Never Used  . Alcohol Use: 1.8 oz/week    3 Glasses of wine per week    Are there smokers in your home (other than you)?  No  Risk Factors Current exercise habits: Gym/ health club routine includes 3 days ag week cardio, weights.  Dietary issues discussed: none   Cardiac risk factors: advanced age (older than 61 for men, 41 for women), dyslipidemia, family history of premature cardiovascular disease, hypertension and male  gender.  Depression Screen (Note: if answer to either of the following is "Yes", a more complete depression screening is indicated)   Q1: Over the past two weeks, have you felt down, depressed or hopeless? No  Q2: Over the past two weeks, have you felt little interest or pleasure in doing things? No  Have you lost interest or pleasure in daily life? No  Do you often feel hopeless? No  Do you cry easily over simple problems? No  Activities of Daily Living In your present state of health, do you have any difficulty performing the following activities?:  Driving? No Managing money?  No Feeding yourself? No Getting from bed to chair? No Climbing a flight of stairs? Yes Preparing food and eating?: No Bathing or showering? No Getting dressed: No Getting to the toilet? No Using the toilet:No Moving around from place to place: No In the past year have you fallen or had a near fall?:No   Are you sexually active?  Yes  Do you have more than one partner?  No  Hearing Difficulties: No Do you often ask people to speak up or repeat themselves? No Do you experience ringing or noises in your ears? yes Do you have difficulty understanding soft or whispered voices? No   Do you feel that you have a problem with memory? No  Do you often misplace items? No  Do you feel safe at home?  Yes  Cognitive Testing  Alert? Yes  Normal Appearance?Yes  Oriented to person? Yes  Place? Yes   Time? Yes  Recall of three objects?  Yes  Can perform simple calculations? Yes  Displays appropriate judgment?Yes  Can read the correct time from a watch face?Yes   Advanced Directives have been discussed with the patient? Yes   List the Names of Other Physician/Practitioners you currently use: 1.    Indicate any recent Medical Services you may have received from other than [facility] providers in the past year (date may be approximate).   There is no immunization history on file for this patient.  Screening  Tests Health Maintenance  Topic Date Due  . TETANUS/TDAP  04/22/1960  . ZOSTAVAX  04/22/2001  . PNEUMOCOCCAL POLYSACCHARIDE VACCINE AGE 64 AND OVER  10/31/2014 (Originally 04/22/2006)  . INFLUENZA VACCINE  04/08/2015 (Originally 10/22/2013)  . COLONOSCOPY  04/13/2017    All answers were reviewed with the patient and necessary referrals were made:  [facility], Cliffton Spradley, PA-C   05/02/2014   History reviewed: allergies, current medications, past family history, past medical history, past social history, past surgical history and problem list  Review of Systems A comprehensive review of systems was negative.    Objective:     Vision by Snellen chart: right eye:pt eye exam was yesterday at [facility]., left ZOX:WRUE eye exam [facility] at [facility]. Blood pressure 132/73, pulse 90, height 6' (1.829 m), weight 210 lb (95.255 kg). Body mass index is 28.47 kg/(m^2).  BP 132/73 mmHg  Pulse 90  Ht 6' (1.829 m)  Wt 210 lb (95.255 kg)  BMI 28.47 kg/m2  General Appearance:    Alert, cooperative, no distress, appears stated age  Head:    Normocephalic, without obvious abnormality, atraumatic  Eyes:    PERRL, conjunctiva/corneas clear, EOM's intact, fundi    benign, both eyes       Ears:    Normal TM's and external ear canals, both ears  Nose:   Nares normal, septum midline, mucosa normal, no drainage    or sinus tenderness  Throat:   Lips, mucosa, and tongue normal; teeth and gums normal  Neck:   Supple, symmetrical, trachea midline, no adenopathy;       thyroid:  No enlargement/tenderness/nodules; no carotid   bruit or JVD  Back:     Symmetric, no curvature, ROM normal, no CVA tenderness  Lungs:     Clear to auscultation bilaterally, respirations unlabored  Chest wall:    No tenderness or deformity  Heart:    Regular rate and rhythm, S1 and S2 normal, no murmur, rub   or gallop  Abdomen:     Soft, non-tender, bowel sounds active all four quadrants,    no masses, no organomegaly  Genitalia:   Not done.   Rectal:  Not done.   Extremities:   Extremities normal, atraumatic, no cyanosis or edema  Pulses:   2+ and symmetric all extremities  Skin:   Skin color, texture, turgor normal, no rashes or lesions  Lymph nodes:   Cervical, supraclavicular, and axillary nodes normal  Neurologic:   CNII-XII intact. Normal strength, sensation and reflexes      throughout       Assessment:          Plan:     During the course of the visit the patient was educated and counseled about appropriate screening and preventive services including:    Pneumococcal vaccine   Diabetes screening  gave script to have zostavax.    Advanced directives given.   Pt declined pneumonia vaccine. Pt aware of recommendations and risk of not having done.  Hyperlipidemia- lipid ordered.  Will screen for diabetes.   colonoscopy up to date.  Pt seen by urologist Dr. Beulah Gandy.   HTN- controlled. Does not need refills today.   Osteoarthritis- voltaren gel refilled to use sparingly as needed. Suggested tumeric natural supplement for aches and pains.   EKG- changed from 2013. Multiple PVC's, possible left atrial enlargement. Will send to cardiology for further evaluation. Occasional SOB and heart flutters. NO CP, headaches, chest tightness.   Patient Instructions (the written plan) was given to the patient.  Medicare Attestation I have personally reviewed: The patient's medical and social history Their use of alcohol, tobacco or illicit drugs Their current medications and supplements The patient's functional ability including ADLs,fall risks, home safety risks, cognitive, and hearing and visual impairment Diet and physical activities Evidence for depression or mood disorders  The patient's weight, height, BMI, and visual acuity have been recorded in the chart.  I have made referrals, counseling, and provided education to the patient based on review of the above and I have provided the patient with  a written personalized care plan for preventive services.     Iran Planas, PA-C   05/02/2014

## 2014-05-02 NOTE — Patient Instructions (Addendum)
Tumeric with black pepper  Pneumococcal Vaccine, Polyvalent suspension for injection What is this medicine? PNEUMOCOCCAL VACCINE, POLYVALENT (NEU mo KOK al vak SEEN, pol ee VEY luhnt) is a vaccine to prevent pneumococcus bacteria infection. These bacteria are a major cause of ear infections, 'Strep throat' infections, and serious pneumonia, meningitis, or blood infections worldwide. These vaccines help the body to produce antibodies (protective substances) that help your body defend against these bacteria. This vaccine is recommended for infants and young children. This vaccine will not treat an infection. This medicine may be used for other purposes; ask your health care provider or pharmacist if you have questions. COMMON BRAND NAME(S): Prevnar 13 What should I tell my health care provider before I take this medicine? They need to know if you have any of these conditions: -bleeding problems -fever -immune system problems -low platelet count in the blood -seizures -an unusual or allergic reaction to pneumococcal vaccine, diphtheria toxoid, other vaccines, latex, other medicines, foods, dyes, or preservatives -pregnant or trying to get pregnant -breast-feeding How should I use this medicine? This vaccine is for injection into a muscle. It is given by a health care professional. A copy of Vaccine Information Statements will be given before each vaccination. Read this sheet carefully each time. The sheet may change frequently. Talk to your pediatrician regarding the use of this medicine in children. While this drug may be prescribed for children as young as 70 weeks old for selected conditions, precautions do apply. Overdosage: If you think you have taken too much of this medicine contact a poison control center or emergency room at once. NOTE: This medicine is only for you. Do not share this medicine with others. What if I miss a dose? It is important not to miss your dose. Call your doctor or  health care professional if you are unable to keep an appointment. What may interact with this medicine? -medicines for cancer chemotherapy -medicines that suppress your immune function -medicines that treat or prevent blood clots like warfarin, enoxaparin, and dalteparin -steroid medicines like prednisone or cortisone This list may not describe all possible interactions. Give your health care provider a list of all the medicines, herbs, non-prescription drugs, or dietary supplements you use. Also tell them if you smoke, drink alcohol, or use illegal drugs. Some items may interact with your medicine. What should I watch for while using this medicine? Mild fever and pain should go away in 3 days or less. Report any unusual symptoms to your doctor or health care professional. What side effects may I notice from receiving this medicine? Side effects that you should report to your doctor or health care professional as soon as possible: -allergic reactions like skin rash, itching or hives, swelling of the face, lips, or tongue -breathing problems -confused -fever over 102 degrees F -pain, tingling, numbness in the hands or feet -seizures -unusual bleeding or bruising -unusual muscle weakness Side effects that usually do not require medical attention (report to your doctor or health care professional if they continue or are bothersome): -aches and pains -diarrhea -fever of 102 degrees F or less -headache -irritable -loss of appetite -pain, tender at site where injected -trouble sleeping This list may not describe all possible side effects. Call your doctor for medical advice about side effects. You may report side effects to FDA at 1-800-FDA-1088. Where should I keep my medicine? This does not apply. This vaccine is given in a clinic, pharmacy, doctor's office, or other health care setting and will not  be stored at home. NOTE: This sheet is a summary. It may not cover all possible  information. If you have questions about this medicine, talk to your doctor, pharmacist, or health care provider.  2015, Elsevier/Gold Standard. (2008-05-23 10:17:22)

## 2014-05-03 IMAGING — CT CT ABD-PELV W/O CM
2 of 3 series · 17 of 34 positions shown, 19 images · non-contrast
Comparison: None.

CLINICAL DATA: The patient with recent conversion of a left
nephrostomy to a left ureteral stents.  Still having fevers, but
views are lower.  CT is to rule out abscess.

CT ABDOMEN AND PELVIS WITHOUT CONTRAST
TECHNIQUE: Multidetector CT imaging of the abdomen and pelvis was
performed following the standard protocol without intravenous
contrast.

[Series 4: lung windows · axial · 0.74mm/px · z∈[-38,+46]mm · 14 of 20 slices shown, 16 images]
[im 2/20  soft-tissue]
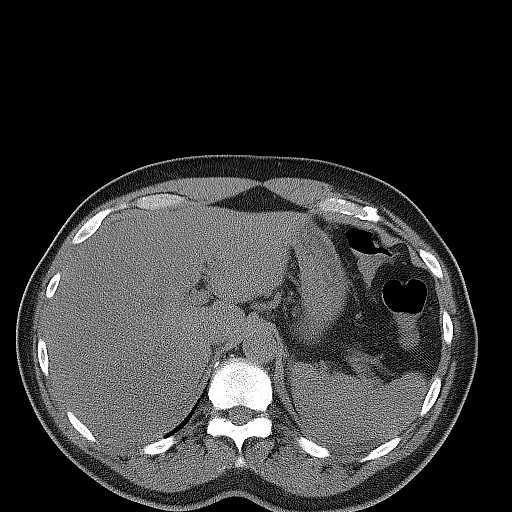
[im 2/20  bone]
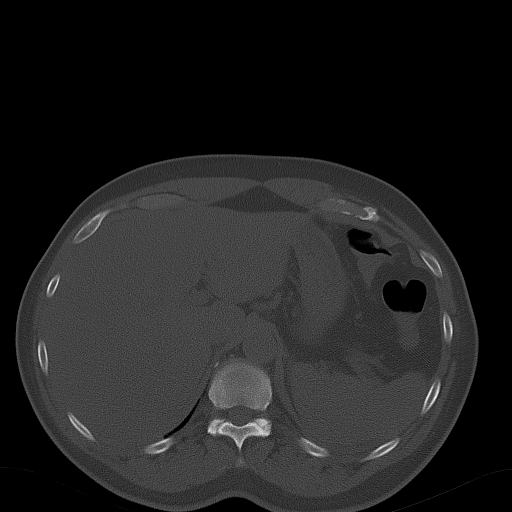
[im 3/20  soft-tissue]
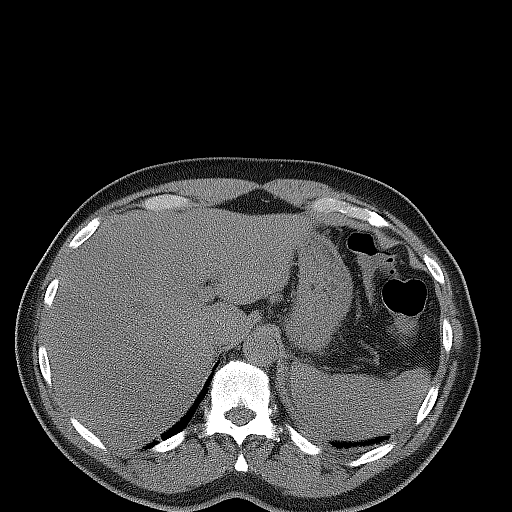
[im 5/20  soft-tissue]
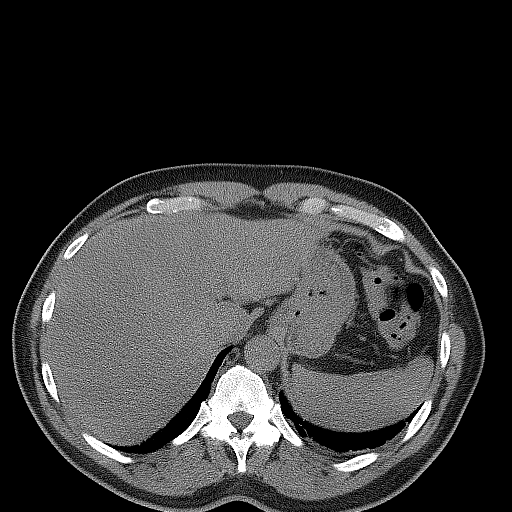
[im 6/20  soft-tissue]
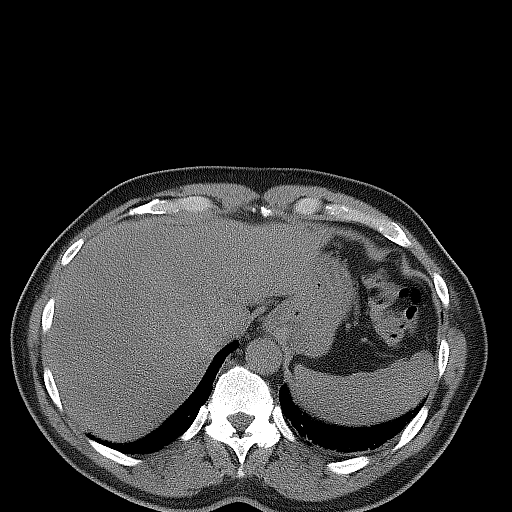
[im 7/20  soft-tissue]
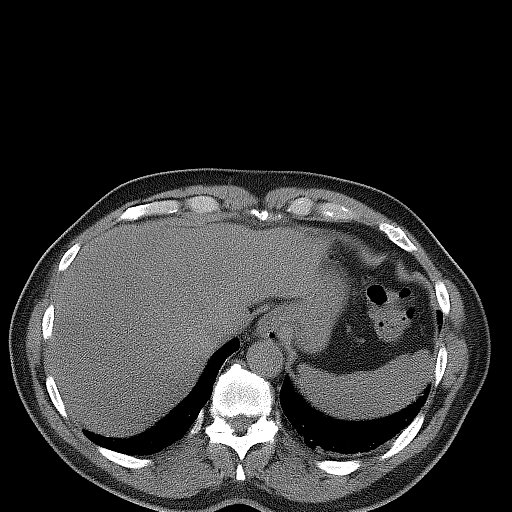
[im 9/20  soft-tissue]
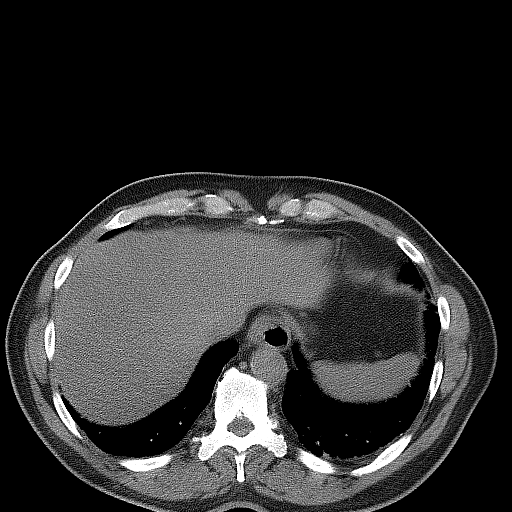
[im 10/20  soft-tissue]
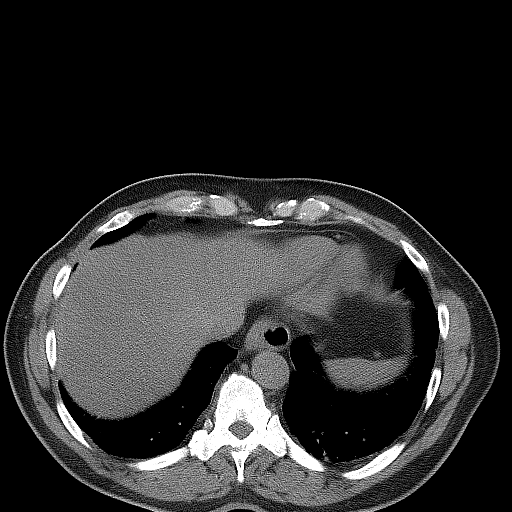
[im 11/20  soft-tissue]
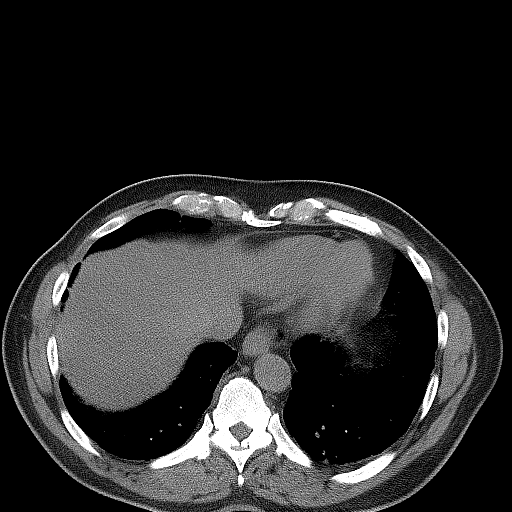
[im 12/20  soft-tissue]
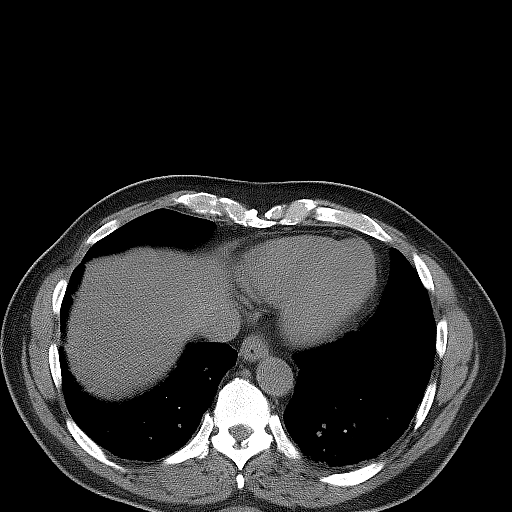
[im 12/20  bone]
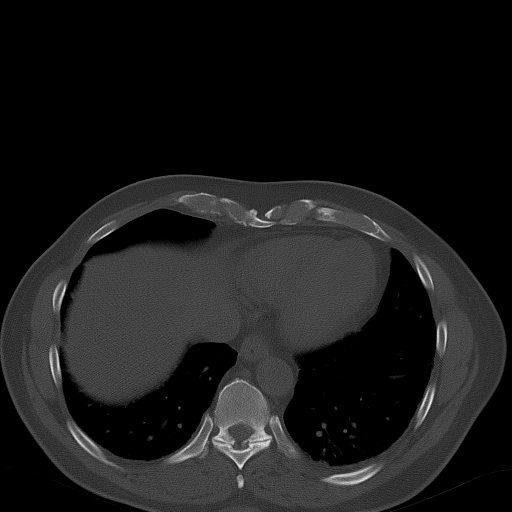
[im 14/20  soft-tissue]
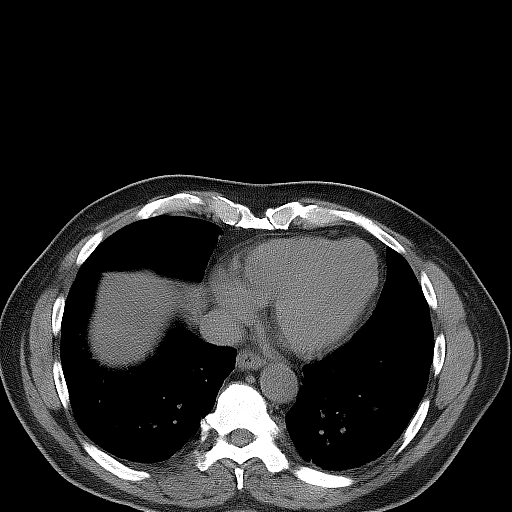
[im 15/20  soft-tissue]
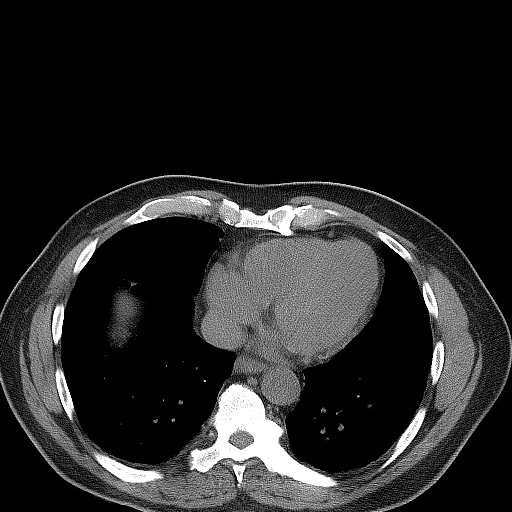
[im 16/20  soft-tissue]
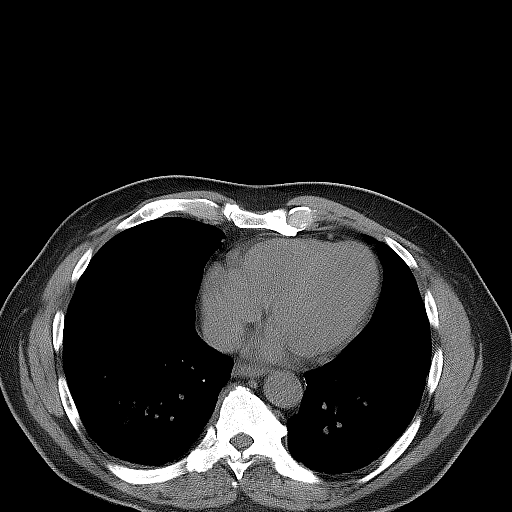
[im 18/20  soft-tissue]
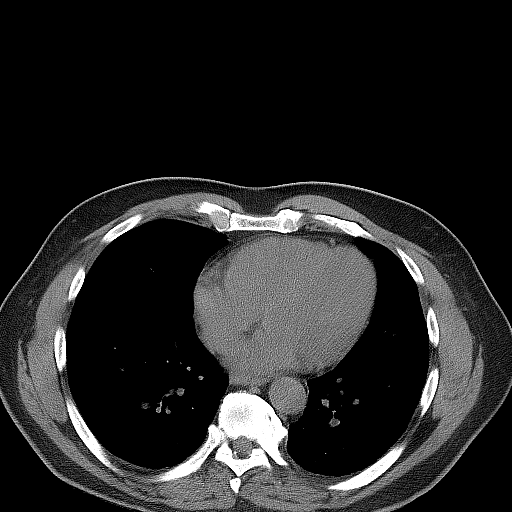
[im 19/20  soft-tissue]
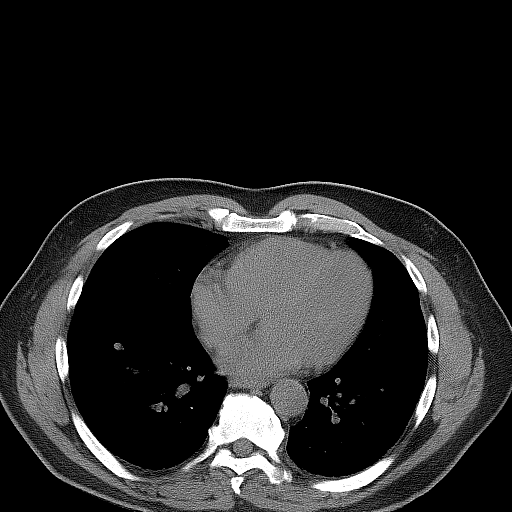

[Series 603: cor · coronal · 0.92mm/px · 3 of 134 slices shown]
[im 45/134  soft-tissue]
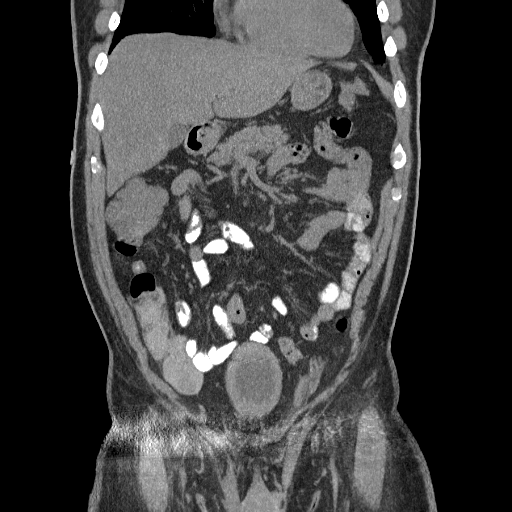
[im 60/134  soft-tissue]
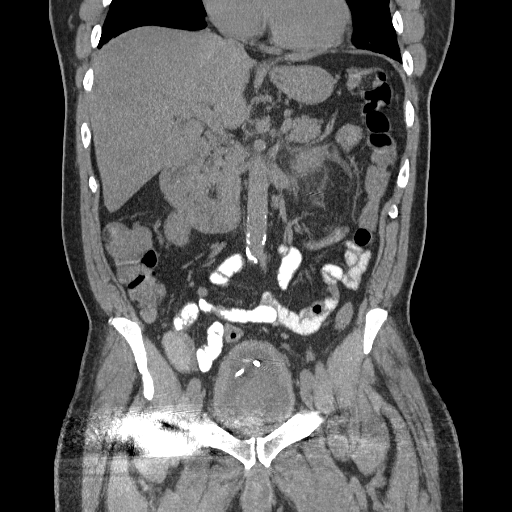
[im 74/134  soft-tissue]
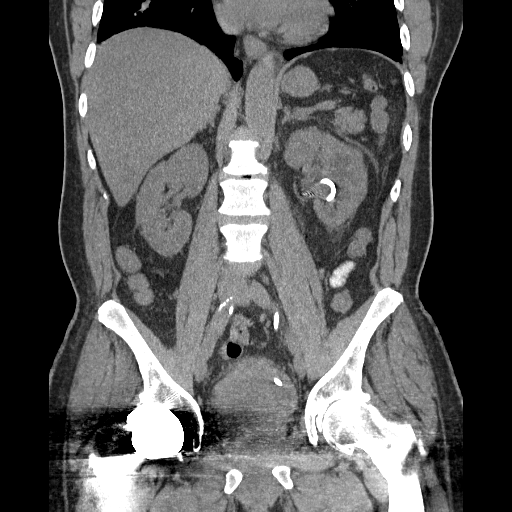

[17 of 34 positions shown; findings below may reference images not displayed]

FINDINGS: Minimal lung base subsegmental atelectasis.  Small hiatal
hernia.  The heart is normal in size.

A ureteral stent extends from the left renal pelvis to the bladder.
There is mild to moderate left renal collecting system dilation
that persists with a small bubble of nondependent air in the left
mid pole calix.  This perinephric stranding is noted, but there is
no perinephric collection to suggest an abscess.  Mildly prominent
left periaortic, perirenal lymph nodes is seen, the largest
measuring 1 cm short axis.

The prostate gland is enlarged and there is a lobulated extension
of the prostate that bulges into the posterior inferior bladder
base.  The bladder is only mildly distended.  The wall is thickened
suggesting bladder outlet obstruction.

Normal right kidney and ureter.

There is fatty infiltration of the liver.  Normal spleen,
gallbladder pancreas.  No bile duct dilation.  No adrenal masses.
The bowel is unremarkable.  A normal appendix is not visualized.

There is a well-positioned right hip prosthesis.  Degenerative
changes are noted throughout the visualized spine and left hip.
IMPRESSION: No evidence of an abscess.

Left ureteral stent as well positioned.  There is still mild to
moderate left hydronephrosis.

Marked enlargement of the prostate gland. Prostate either bulges
into or invades the posterior inferior bladder base with associated
bladder wall thickening suggesting bladder outlet obstruction.

Mild left periaortic adenopathy, nonspecific but likely reactive.

## 2014-05-04 DIAGNOSIS — E785 Hyperlipidemia, unspecified: Secondary | ICD-10-CM | POA: Diagnosis not present

## 2014-05-04 DIAGNOSIS — Z0000 Encounter for general adult medical examination without abnormal findings: Secondary | ICD-10-CM | POA: Diagnosis not present

## 2014-05-04 LAB — COMPLETE METABOLIC PANEL WITH GFR
ALT: 22 U/L (ref 0–53)
AST: 21 U/L (ref 0–37)
Albumin: 4.2 g/dL (ref 3.5–5.2)
Alkaline Phosphatase: 58 U/L (ref 39–117)
BUN: 15 mg/dL (ref 6–23)
CO2: 27 mEq/L (ref 19–32)
Calcium: 9.4 mg/dL (ref 8.4–10.5)
Chloride: 105 mEq/L (ref 96–112)
Creat: 1.35 mg/dL (ref 0.50–1.35)
GFR, Est African American: 60 mL/min
GFR, Est Non African American: 52 mL/min — ABNORMAL LOW
Glucose, Bld: 102 mg/dL — ABNORMAL HIGH (ref 70–99)
Potassium: 4.7 mEq/L (ref 3.5–5.3)
Sodium: 141 mEq/L (ref 135–145)
Total Bilirubin: 0.6 mg/dL (ref 0.2–1.2)
Total Protein: 7.6 g/dL (ref 6.0–8.3)

## 2014-05-04 LAB — LIPID PANEL
Cholesterol: 199 mg/dL (ref 0–200)
HDL: 76 mg/dL (ref >39)
LDL Cholesterol: 105 mg/dL — ABNORMAL HIGH (ref 0–99)
Total CHOL/HDL Ratio: 2.6 Ratio
Triglycerides: 91 mg/dL (ref <150)
VLDL: 18 mg/dL (ref 0–40)

## 2014-05-05 NOTE — Addendum Note (Signed)
Addended by: Mallie Snooks A on: 05/05/2014 12:02 PM   Modules accepted: Orders

## 2014-05-31 ENCOUNTER — Other Ambulatory Visit: Payer: Self-pay

## 2014-05-31 MED ORDER — PITAVASTATIN CALCIUM 2 MG PO TABS: 2.0000 mg | ORAL_TABLET | Freq: Every day | ORAL | Status: DC

## 2014-06-01 ENCOUNTER — Encounter: Payer: Self-pay | Admitting: Physician Assistant

## 2014-06-12 NOTE — Progress Notes (Signed)
HPI: 74 year old male for evaluation of abnormal electrocardiogram. Potassium February 2016 normal. Patient denies dyspnea, chest pain, palpitations or syncope.  Current Outpatient Prescriptions  Medication Sig Dispense Refill  . acetaminophen (TYLENOL) 500 MG tablet Take 1,000 mg by mouth every 6 (six) hours as needed. For pain    . AMBULATORY NON FORMULARY MEDICATION Zostavax IM once for shingles prevention. 1 application 0  . amLODipine (NORVASC) 10 MG tablet Take 1 tablet (10 mg total) by mouth daily. 30 tablet 2  . colchicine 0.6 MG tablet Take 1 tablet (0.6 mg total) by mouth daily. 60 tablet 2  . diclofenac sodium (VOLTAREN) 1 % GEL Apply 4 g topically 4 (four) times daily. 4 Tube 1  . Pitavastatin Calcium 2 MG TABS Take 1 tablet (2 mg total) by mouth daily. 90 tablet 1   No current facility-administered medications for this visit.    Allergies  Allergen Reactions  . Lipitor [Atorvastatin]     Muscle ache  . Statins     Muscle soreness  . Tramadol     nausea     Past Medical History  Diagnosis Date  . Hyperlipidemia   . Hypertension   . Gross hematuria   . BPH (benign prostatic hypertrophy)   . Elevated PSA   . OA (osteoarthritis) knees  . Hydronephrosis, left   . Acid reflux OCCASIONALLY TAKE TUMS  . Complication of anesthesia     Past Surgical History  Procedure Laterality Date  . Total hip arthroplasty  07-09-2005  DR ALUISIO    RIGHT HIP OA  . Left knee surgery  1993  (APPROX)  . Appendectomy  AGE 87  . Cataract extraction w/ intraocular lens implant  2013    RIGHT EYE  . Cystoscopy  02/13/2012    Procedure: CYSTOSCOPY FLEXIBLE;  Surgeon: Hanley Ben, MD;  Location: [facility];  Service: Urology;  Laterality: N/A;  . Prostatectomy N/A 08/03/2012    Procedure: SIMPLE RETROPUBIC PROSTATECTOMY, removal of left double J stent. ;  Surgeon: Hanley Ben, MD;  Location: [facility];  Service: Urology;  Laterality: N/A;    History    Social History  . Marital Status: Single    Spouse Name: N/A  . Number of Children: 5  . Years of Education: N/A   Occupational History  . Not on file.   Social History Main Topics  . Smoking status: Never Smoker   . Smokeless tobacco: Never Used  . Alcohol Use: Yes     Comment: 2 glasses wine per night  . Drug Use: No  . Sexual Activity: Not on file   Other Topics Concern  . Not on file   Social History Narrative    Family History  Problem Relation Age of Onset  . Hypertension Mother   . Cancer Father     prostate  . Hypertension Father   . Diabetes Father     ROS: no fevers or chills, productive cough, hemoptysis, dysphasia, odynophagia, melena, hematochezia, dysuria, hematuria, rash, seizure activity, orthopnea, PND, pedal edema, claudication. Remaining systems are negative.  Physical Exam:   Blood pressure 144/82, pulse 103, height 6' (1.829 m), weight 213 lb 6.4 oz (96.798 kg), SpO2 98 %.  General:  Well developed/well nourished in NAD Skin warm/dry Patient not depressed No peripheral clubbing Back-normal HEENT-normal/normal eyelids Neck supple/normal carotid upstroke bilaterally; no bruits; no JVD; no thyromegaly chest - CTA/ normal expansion CV - RRR/normal S1 and S2; no murmurs, rubs or gallops;  PMI nondisplaced Abdomen -NT/ND, no HSM, no mass, + bowel sounds, no bruit 2+ femoral pulses, no bruits Ext-no edema, chords, 2+ DP Neuro-grossly nonfocal  ECG 05/02/2014-sinus rhythm, left axis deviation, frequent PVCs.

## 2014-06-14 ENCOUNTER — Encounter: Payer: Self-pay | Admitting: Cardiology

## 2014-06-14 ENCOUNTER — Ambulatory Visit (INDEPENDENT_AMBULATORY_CARE_PROVIDER_SITE_OTHER): Payer: Medicare Other | Admitting: Cardiology

## 2014-06-14 VITALS — BP 144/82 | HR 103 | Ht 72.0 in | Wt 213.4 lb

## 2014-06-14 DIAGNOSIS — R9431 Abnormal electrocardiogram [ECG] [EKG]: Secondary | ICD-10-CM

## 2014-06-14 NOTE — Assessment & Plan Note (Signed)
Electrocardiogram shows frequent PVCs. Patient is not having symptoms. Check TSH, magnesium and potassium. Check echocardiogram. If normal we'll not pursue further evaluation.

## 2014-06-14 NOTE — Assessment & Plan Note (Signed)
Blood pressure mildly elevated but he follows this and it is typically controlled. Continue present medications.

## 2014-06-14 NOTE — Patient Instructions (Signed)
Your physician recommends that you schedule a follow-up appointment in: AS NEEDED PENDING TEST RESULTS  Your physician recommends that you HAVE LAB [place]  Your physician has requested that you have an echocardiogram. Echocardiography is a painless test that uses sound waves to create images of your heart. It provides your doctor with information about the size and shape of your heart and how well your heart's chambers and valves are working. This procedure takes approximately one hour. There are no restrictions for this procedure.

## 2014-06-14 NOTE — Assessment & Plan Note (Signed)
Continue statin. Followed by primary care.

## 2014-06-15 LAB — BASIC METABOLIC PANEL WITH GFR
BUN: 14 mg/dL (ref 6–23)
CO2: 25 mEq/L (ref 19–32)
Calcium: 9.9 mg/dL (ref 8.4–10.5)
Chloride: 104 mEq/L (ref 96–112)
Creat: 1.26 mg/dL (ref 0.50–1.35)
GFR, Est African American: 65 mL/min
GFR, Est Non African American: 56 mL/min — ABNORMAL LOW
Glucose, Bld: 90 mg/dL (ref 70–99)
Potassium: 4.4 mEq/L (ref 3.5–5.3)
Sodium: 139 mEq/L (ref 135–145)

## 2014-06-15 LAB — MAGNESIUM: Magnesium: 1.9 mg/dL (ref 1.5–2.5)

## 2014-06-15 LAB — TSH: TSH: 2.064 u[IU]/mL (ref 0.350–4.500)

## 2014-06-19 ENCOUNTER — Ambulatory Visit (HOSPITAL_COMMUNITY)
Admission: RE | Admit: 2014-06-19 | Discharge: 2014-06-19 | Disposition: A | Discharge status: Home / Self Care | Payer: Medicare Other | Source: Ambulatory Visit | Attending: Cardiology | Admitting: Cardiology

## 2014-06-19 DIAGNOSIS — R9431 Abnormal electrocardiogram [ECG] [EKG]: Secondary | ICD-10-CM | POA: Diagnosis not present

## 2014-06-19 NOTE — Progress Notes (Signed)
2D Echocardiogram Complete.  06/19/2014   Chariti Havel, RDCS

## 2014-07-18 IMAGING — CR DG CHEST 2V
2 series · 2 of 2 positions shown · non-contrast
Comparison: 01/22/2011; 07/04/2005

CLINICAL DATA: Preoperative examination (retropubic prostatectomy)

CHEST - 2 VIEW

[w chest pa]
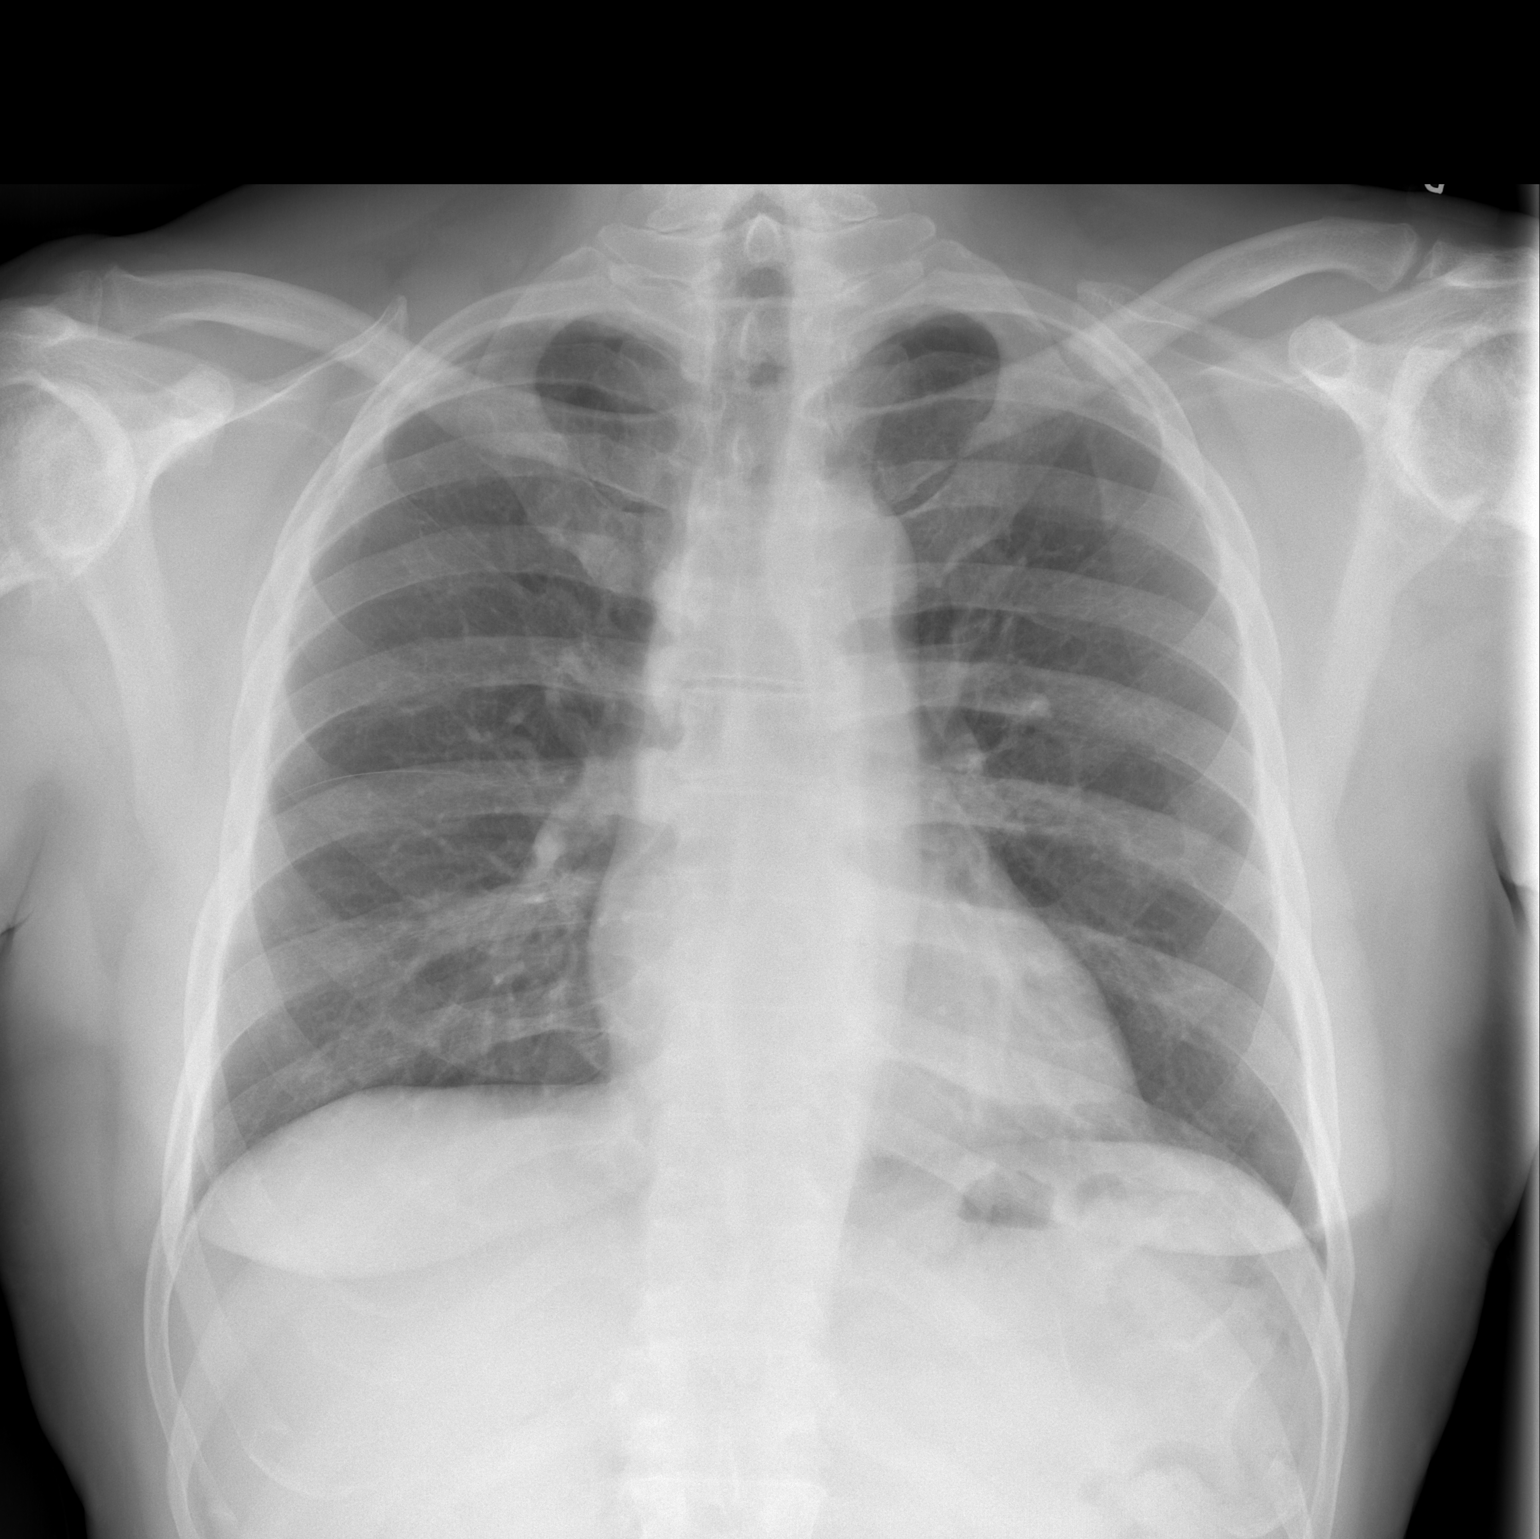

[w chest lat]
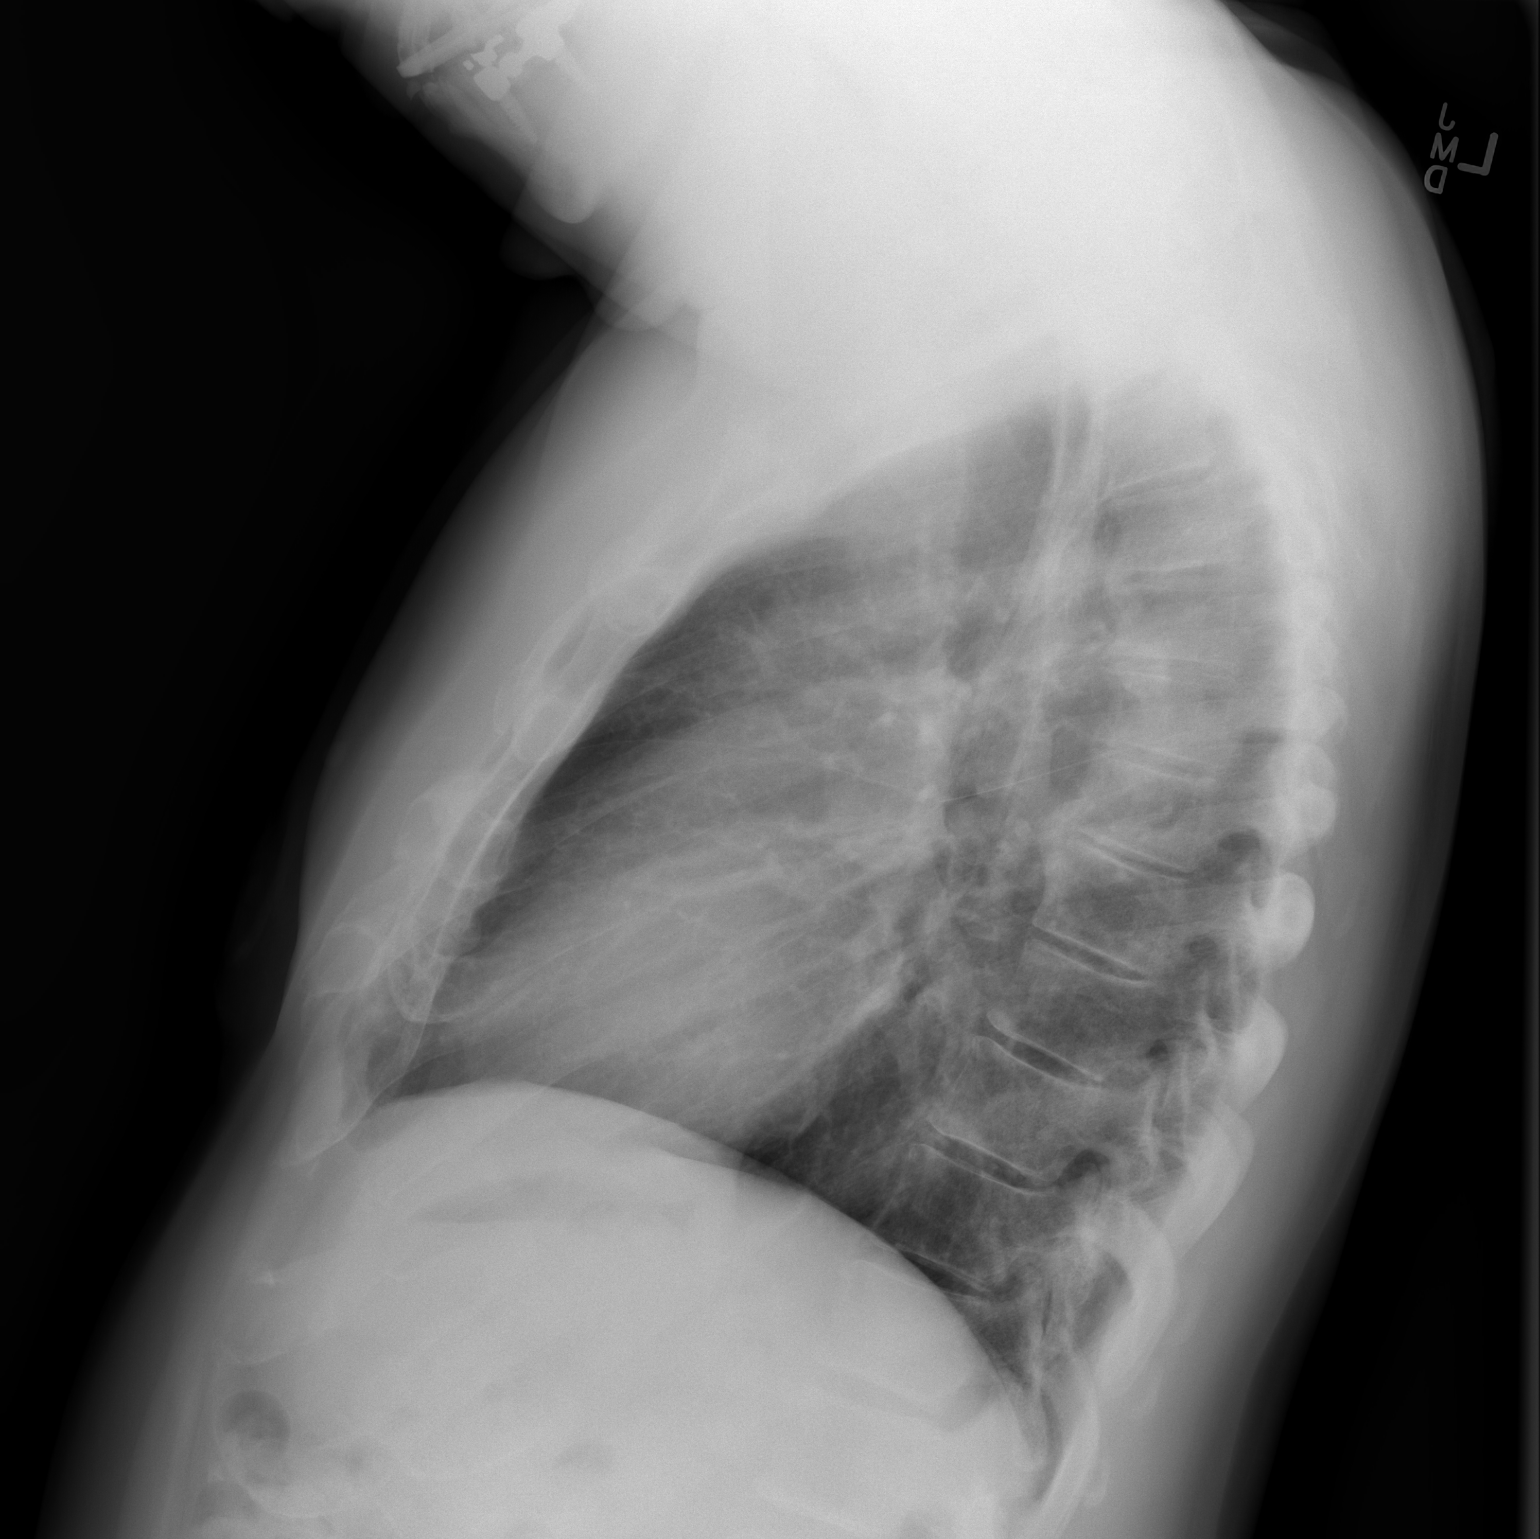

[2 of 2 positions shown; findings below may reference images not displayed]

FINDINGS: Grossly unchanged cardiac silhouette and mediastinal
contours. No focal parenchymal opacities.  No pleural effusion or
pneumothorax.  No definite evidence of edema.  No acute osseous
abnormality.
IMPRESSION: No acute cardiopulmonary disease.

## 2014-07-19 ENCOUNTER — Other Ambulatory Visit: Payer: Self-pay | Admitting: Physician Assistant

## 2014-08-08 DIAGNOSIS — N1330 Unspecified hydronephrosis: Secondary | ICD-10-CM | POA: Diagnosis not present

## 2014-10-06 ENCOUNTER — Encounter: Payer: Self-pay | Admitting: Family Medicine

## 2014-10-06 ENCOUNTER — Ambulatory Visit (INDEPENDENT_AMBULATORY_CARE_PROVIDER_SITE_OTHER): Payer: Medicare Other | Admitting: Family Medicine

## 2014-10-06 VITALS — BP 138/78 | HR 83 | Wt 215.0 lb

## 2014-10-06 DIAGNOSIS — I10 Essential (primary) hypertension: Secondary | ICD-10-CM

## 2014-10-06 DIAGNOSIS — Z8639 Personal history of other endocrine, nutritional and metabolic disease: Secondary | ICD-10-CM | POA: Diagnosis not present

## 2014-10-06 DIAGNOSIS — Z8739 Personal history of other diseases of the musculoskeletal system and connective tissue: Secondary | ICD-10-CM

## 2014-10-06 MED ORDER — ZOSTER VACCINE LIVE 19400 UNT/0.65ML ~~LOC~~ SOLR: 0.6500 mL | Freq: Once | SUBCUTANEOUS | Status: DC

## 2014-10-06 MED ORDER — AMLODIPINE BESYLATE 10 MG PO TABS: ORAL_TABLET | ORAL | Status: DC

## 2014-10-06 NOTE — Progress Notes (Signed)
CC: Albert Hicks is a 74 y.o. male is here for Hypertension   Subjective: HPI:  Follow-up essential hypertension: Continues to take amlodipine on a daily basis. He has noticed that his ankle swelled since he started his medication however it's never caused any pain and it doesn't seem to bother him. He denies any other side effects. Denies chest pain shortness of breath orthopnea nor peripheral edema other than that described above  He tells me he forgot to get his shingles vaccine was given to him back in February. He's lost the prescription. He is interested in getting this immunization.   Review Of Systems Outlined In HPI  Past Medical History  Diagnosis Date  . Hyperlipidemia   . Hypertension   . Gross hematuria   . BPH (benign prostatic hypertrophy)   . Elevated PSA   . OA (osteoarthritis) knees  . Hydronephrosis, left   . Acid reflux OCCASIONALLY TAKE TUMS  . Complication of anesthesia     Past Surgical History  Procedure Laterality Date  . Total hip arthroplasty  07-09-2005  DR ALUISIO    RIGHT HIP OA  . Left knee surgery  1993  (APPROX)  . Appendectomy  AGE 89  . Cataract extraction w/ intraocular lens implant  2013    RIGHT EYE  . Cystoscopy  02/13/2012    Procedure: CYSTOSCOPY FLEXIBLE;  Surgeon: Hanley Ben, MD;  Location: [facility];  Service: Urology;  Laterality: N/A;  . Prostatectomy N/A 08/03/2012    Procedure: SIMPLE RETROPUBIC PROSTATECTOMY, removal of left double J stent. ;  Surgeon: Hanley Ben, MD;  Location: [facility];  Service: Urology;  Laterality: N/A;   Family History  Problem Relation Age of Onset  . Hypertension Mother   . Cancer Father     prostate  . Hypertension Father   . Diabetes Father     History   Social History  . Marital Status: Single    Spouse Name: N/A  . Number of Children: 5  . Years of Education: N/A   Occupational History  . Not on file.   Social History Main Topics  . Smoking status: Never  Smoker   . Smokeless tobacco: Never Used  . Alcohol Use: Yes     Comment: 2 glasses wine per night  . Drug Use: No  . Sexual Activity: Not on file   Other Topics Concern  . Not on file   Social History Narrative     Objective: BP 138/78 mmHg  Pulse 83  Wt 215 lb (97.523 kg)  General: Alert and Oriented, No Acute Distress HEENT: Pupils equal, round, reactive to light. Conjunctivae clear.  Moist mucous membranes Lungs: Clear to auscultation bilaterally, no wheezing/ronchi/rales.  Comfortable work of breathing. Good air movement. Cardiac: Regular rate and rhythm. Normal S1/S2.  No murmurs, rubs, nor gallops.   Abdomen: Normal bowel sounds, soft and non tender without palpable masses. Extremities: Trace nonpitting ankle edema.  Strong peripheral pulses.  Mental Status: No depression, anxiety, nor agitation. Skin: Warm and dry.  Assessment & Plan: Albert Hicks was seen today for hypertension.  Diagnoses and all orders for this visit:  Essential hypertension  History of gout  Other orders -     zoster vaccine live, PF, (ZOSTAVAX) 95093 UNT/0.65ML injection; Inject 19,400 Units into the skin once. -     amLODipine (NORVASC) 10 MG tablet; TAKE 1 TABLET (10 MG TOTAL) BY MOUTH DAILY.   Essential hypertension: Controlled continue amlodipine, side effects or not that enough  to switch to a different medication. Gout: Controlled without flares since his last visit.  Return in about 6 months (around 04/08/2015).

## 2015-01-18 ENCOUNTER — Ambulatory Visit (INDEPENDENT_AMBULATORY_CARE_PROVIDER_SITE_OTHER): Payer: Medicare Other | Admitting: Physician Assistant

## 2015-01-18 ENCOUNTER — Encounter: Payer: Self-pay | Admitting: Physician Assistant

## 2015-01-18 VITALS — BP 133/65 | HR 88 | Temp 98.8°F | Ht 72.0 in | Wt 222.0 lb

## 2015-01-18 DIAGNOSIS — M170 Bilateral primary osteoarthritis of knee: Secondary | ICD-10-CM | POA: Diagnosis not present

## 2015-01-18 DIAGNOSIS — J208 Acute bronchitis due to other specified organisms: Secondary | ICD-10-CM | POA: Diagnosis not present

## 2015-01-18 DIAGNOSIS — Z79899 Other long term (current) drug therapy: Secondary | ICD-10-CM | POA: Diagnosis not present

## 2015-01-18 MED ORDER — BENZONATATE 200 MG PO CAPS: 200.0000 mg | ORAL_CAPSULE | Freq: Two times a day (BID) | ORAL | Status: DC | PRN

## 2015-01-18 MED ORDER — DICLOFENAC SODIUM 1 % TD GEL: 4.0000 g | Freq: Four times a day (QID) | TRANSDERMAL | Status: DC

## 2015-01-18 MED ORDER — AZITHROMYCIN 250 MG PO TABS: ORAL_TABLET | ORAL | Status: DC

## 2015-01-18 MED ORDER — HYDROCOD POLST-CPM POLST ER 10-8 MG/5ML PO SUER: 5.0000 mL | Freq: Every evening | ORAL | Status: DC | PRN

## 2015-01-18 MED ORDER — MELOXICAM 15 MG PO TABS: 15.0000 mg | ORAL_TABLET | Freq: Every day | ORAL | Status: DC

## 2015-01-18 NOTE — Patient Instructions (Signed)
Tumeric 500mg  capsule twice a day.

## 2015-01-18 NOTE — Progress Notes (Signed)
   Subjective:    Patient ID: Albert Hicks, male    DOB: [birth year], 74 y.o.   MRN: [id number]  HPI  Patient is a 74 year old male who presents to the clinic with 6 days of chest congestion and cough. 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 He got wet Friday night and the symptoms started the next day. He denies any wheezing or shortness of breath. He denies any fever, sinus pressure or ear pain. His cough has started to become productive. He has not tried anything over-the-counter. Patient is very concerned because he has home, this weekend and he has to be able to announce. If he has a persistent cough the producers will not be very happy with him.  Patient does have ongoing bilateral primary osteoarthritis knee pain. He wants to know if he can have a few Modic to use as needed for overall pain. This helped tremendously with his symptoms. Last time we gave him motivate his kidney function began to worsen. He continues to use while tearing gel regularly which helped significantly. He has an orthopedist and he knows he needs a knee replacement.     Review of Systems  All other systems reviewed and are negative.      Objective:   Physical Exam  Constitutional: He is oriented to person, place, and time. He appears well-developed and well-nourished.  HENT:  Head: Normocephalic and atraumatic.  Cardiovascular: Normal rate, regular rhythm and normal heart sounds.   Pulmonary/Chest: Breath sounds normal.  Coarse breath sounds and scattered rhonchi. Most of rhonchi clear with cough.  Neurological: He is alert and oriented to person, place, and time.  Psychiatric: He has a normal mood and affect. His behavior is normal.          Assessment & Plan:  Acute bronchitis- Azithromycin given today. Patient was encouraged to get Mucinex over-the-counter and start. Tussionex was given at bedtime for cough. Tessalon Perles were given to use throughout the day for cough. Follow-up if symptoms worsen  or do not improve.  Primary osteoarthritis- refilled voltaren. Encouraged tumeric. I do think tumor will help with some of his inflammation. Will recheck BMP for kidney function after taking Modic only as needed.  Discussed need for knee replacement. mobic given to use only as needed.

## 2015-02-06 ENCOUNTER — Telehealth: Payer: Self-pay | Admitting: *Deleted

## 2015-02-06 ENCOUNTER — Other Ambulatory Visit: Payer: Self-pay | Admitting: *Deleted

## 2015-02-06 MED ORDER — HYDROCOD POLST-CPM POLST ER 10-8 MG/5ML PO SUER: 5.0000 mL | Freq: Every evening | ORAL | Status: DC | PRN

## 2015-02-06 NOTE — Telephone Encounter (Signed)
Pt called stating that he was in [place] this past weekend and it was really cold and his cough came back.  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  He wants to know if you would give him a refill on the tussionex.  Please advise.

## 2015-02-06 NOTE — Telephone Encounter (Signed)
[place] for one more refill.

## 2015-02-07 NOTE — Telephone Encounter (Signed)
Pt notified to pick up rx.

## 2015-02-19 ENCOUNTER — Other Ambulatory Visit: Payer: Self-pay | Admitting: Family Medicine

## 2015-04-27 ENCOUNTER — Other Ambulatory Visit: Payer: Self-pay | Admitting: *Deleted

## 2015-04-27 MED ORDER — AMLODIPINE BESYLATE 10 MG PO TABS: 10.0000 mg | ORAL_TABLET | Freq: Every day | ORAL | Status: DC

## 2015-05-11 DIAGNOSIS — H40013 Open angle with borderline findings, low risk, bilateral: Secondary | ICD-10-CM | POA: Diagnosis not present

## 2015-05-28 ENCOUNTER — Other Ambulatory Visit: Payer: Self-pay

## 2015-05-28 MED ORDER — AMLODIPINE BESYLATE 10 MG PO TABS: 10.0000 mg | ORAL_TABLET | Freq: Every day | ORAL | Status: DC

## 2015-06-01 ENCOUNTER — Encounter: Payer: Self-pay | Admitting: *Deleted

## 2015-06-06 ENCOUNTER — Encounter: Payer: Self-pay | Admitting: Physician Assistant

## 2015-06-06 ENCOUNTER — Ambulatory Visit (INDEPENDENT_AMBULATORY_CARE_PROVIDER_SITE_OTHER): Payer: Medicare Other | Admitting: Physician Assistant

## 2015-06-06 VITALS — BP 138/62 | HR 81 | Ht 72.0 in | Wt 219.0 lb

## 2015-06-06 DIAGNOSIS — N138 Other obstructive and reflux uropathy: Secondary | ICD-10-CM

## 2015-06-06 DIAGNOSIS — M25562 Pain in left knee: Secondary | ICD-10-CM | POA: Diagnosis not present

## 2015-06-06 DIAGNOSIS — N183 Chronic kidney disease, stage 3 unspecified: Secondary | ICD-10-CM

## 2015-06-06 DIAGNOSIS — E785 Hyperlipidemia, unspecified: Secondary | ICD-10-CM

## 2015-06-06 DIAGNOSIS — Z23 Encounter for immunization: Secondary | ICD-10-CM | POA: Diagnosis not present

## 2015-06-06 DIAGNOSIS — R7301 Impaired fasting glucose: Secondary | ICD-10-CM

## 2015-06-06 DIAGNOSIS — Z79899 Other long term (current) drug therapy: Secondary | ICD-10-CM

## 2015-06-06 DIAGNOSIS — N401 Enlarged prostate with lower urinary tract symptoms: Secondary | ICD-10-CM | POA: Diagnosis not present

## 2015-06-06 DIAGNOSIS — M170 Bilateral primary osteoarthritis of knee: Secondary | ICD-10-CM

## 2015-06-06 LAB — COMPLETE METABOLIC PANEL WITH GFR
ALT: 17 U/L (ref 9–46)
AST: 18 U/L (ref 10–35)
Albumin: 4.2 g/dL (ref 3.6–5.1)
Alkaline Phosphatase: 57 U/L (ref 40–115)
BUN: 16 mg/dL (ref 7–25)
CO2: 26 mmol/L (ref 20–31)
Calcium: 9.7 mg/dL (ref 8.6–10.3)
Chloride: 103 mmol/L (ref 98–110)
Creat: 1.5 mg/dL — ABNORMAL HIGH (ref 0.70–1.18)
GFR, Est African American: 52 mL/min — ABNORMAL LOW (ref >=60)
GFR, Est Non African American: 45 mL/min — ABNORMAL LOW (ref >=60)
Glucose, Bld: 106 mg/dL — ABNORMAL HIGH (ref 65–99)
Potassium: 4.6 mmol/L (ref 3.5–5.3)
Sodium: 140 mmol/L (ref 135–146)
Total Bilirubin: 0.7 mg/dL (ref 0.2–1.2)
Total Protein: 8 g/dL (ref 6.1–8.1)

## 2015-06-06 LAB — CBC
HCT: 42.6 % (ref 39.0–52.0)
Hemoglobin: 14.6 g/dL (ref 13.0–17.0)
MCH: 33.8 pg (ref 26.0–34.0)
MCHC: 34.3 g/dL (ref 30.0–36.0)
MCV: 98.6 fL (ref 78.0–100.0)
MPV: 10.3 fL (ref 8.6–12.4)
Platelets: 339 10*3/uL (ref 150–400)
RBC: 4.32 MIL/uL (ref 4.22–5.81)
RDW: 13.7 % (ref 11.5–15.5)
WBC: 6.2 10*3/uL (ref 4.0–10.5)

## 2015-06-06 LAB — LIPID PANEL
Cholesterol: 269 mg/dL — ABNORMAL HIGH (ref 125–200)
HDL: 72 mg/dL (ref >=40)
LDL Cholesterol: 178 mg/dL — ABNORMAL HIGH (ref <130)
Total CHOL/HDL Ratio: 3.7 Ratio (ref <=5.0)
Triglycerides: 97 mg/dL (ref <150)
VLDL: 19 mg/dL (ref <30)

## 2015-06-06 MED ORDER — AMLODIPINE BESYLATE 10 MG PO TABS: 10.0000 mg | ORAL_TABLET | Freq: Every day | ORAL | Status: DC

## 2015-06-06 MED ORDER — DICLOFENAC SODIUM 1 % TD GEL: 4.0000 g | Freq: Four times a day (QID) | TRANSDERMAL | Status: DC

## 2015-06-06 NOTE — Progress Notes (Signed)
   Subjective:    Patient ID: Albert Hicks, male    DOB: 01-Jul-1940, 76 y.o.   MRN: [id number]  HPI  Patient is a 75 year old male who presents to the clinic for medication refills.  Osteoarthritis of both knees with left being worse than right. He is ready for referral to see about knee replacement. He has suffered with knee pain for years. It is now affecting him been able to do the things he loves such as working out. He would like to be referred to [facility]. He cannot take anti-inflammatories due to chronic kidney disease. He is using diclofenac gel which is helping significantly. Tried injections in knee with orthopedist but not here in office.   Hypertension-patient is taking Norvasc daily and doing well. Denies any chest pains, palpitations, dizziness.  Patient has a history of BPH with obstruction that calls in part his CKD. He did have a TURP and since he has not had any BPH symptoms. He denies any symptoms today. He no longer sees urology.   Review of Systems  All other systems reviewed and are negative.      Objective:   Physical Exam  Constitutional: He appears well-developed and well-nourished.  HENT:  Head: Normocephalic and atraumatic.  Cardiovascular: Normal rate, regular rhythm and normal heart sounds.   Pulmonary/Chest: Effort normal and breath sounds normal.  Psychiatric: He has a normal mood and affect. His behavior is normal.          Assessment & Plan:  Osteoarthritis bilateral knees with left worse than right-referral placed for [facility] with Dr. Pilar Plate. Diclofenac gel refilled today. Will check CMP to make sure that kidney's are not being effected by topical NSAID.  Hx of BPH due to obstruction resolved since TURP/CKD- AUA is 1 today. Not on any medications. PSA ordered with CMP.   HTN- refilled norvasc for 6 months.   prevnar 13 and Tdap given today.

## 2015-06-07 LAB — PSA, TOTAL AND FREE
PSA, Free Pct: 16 % — ABNORMAL LOW (ref >25)
PSA, Free: 0.8 ng/mL
PSA: 5.12 ng/mL — ABNORMAL HIGH (ref <=4.00)

## 2015-06-08 ENCOUNTER — Encounter: Payer: Self-pay | Admitting: Physician Assistant

## 2015-06-08 DIAGNOSIS — R7301 Impaired fasting glucose: Secondary | ICD-10-CM

## 2015-06-08 DIAGNOSIS — R739 Hyperglycemia, unspecified: Secondary | ICD-10-CM | POA: Insufficient documentation

## 2015-06-13 DIAGNOSIS — M1712 Unilateral primary osteoarthritis, left knee: Secondary | ICD-10-CM | POA: Diagnosis not present

## 2015-06-13 DIAGNOSIS — M170 Bilateral primary osteoarthritis of knee: Secondary | ICD-10-CM | POA: Diagnosis not present

## 2015-06-15 ENCOUNTER — Other Ambulatory Visit: Payer: Self-pay | Admitting: *Deleted

## 2015-06-15 MED ORDER — PITAVASTATIN CALCIUM 2 MG PO TABS: 2.0000 mg | ORAL_TABLET | Freq: Every day | ORAL | Status: DC

## 2015-06-15 NOTE — Addendum Note (Signed)
Addended by: Beatris Ship L on: 06/15/2015 12:01 PM   Modules accepted: Orders

## 2015-06-20 DIAGNOSIS — R7301 Impaired fasting glucose: Secondary | ICD-10-CM | POA: Diagnosis not present

## 2015-06-21 LAB — HEMOGLOBIN A1C
Hgb A1c MFr Bld: 5.9 % — ABNORMAL HIGH (ref <5.7)
Mean Plasma Glucose: 123 mg/dL

## 2015-06-22 ENCOUNTER — Encounter: Payer: Self-pay | Admitting: Physician Assistant

## 2015-06-22 DIAGNOSIS — R7303 Prediabetes: Secondary | ICD-10-CM | POA: Insufficient documentation

## 2015-07-10 ENCOUNTER — Encounter: Payer: Self-pay | Admitting: Physician Assistant

## 2015-07-10 ENCOUNTER — Ambulatory Visit (INDEPENDENT_AMBULATORY_CARE_PROVIDER_SITE_OTHER): Payer: Medicare Other | Admitting: Physician Assistant

## 2015-07-10 VITALS — BP 133/64 | HR 80 | Ht 72.0 in | Wt 218.0 lb

## 2015-07-10 DIAGNOSIS — Z01818 Encounter for other preprocedural examination: Secondary | ICD-10-CM

## 2015-07-10 DIAGNOSIS — M1712 Unilateral primary osteoarthritis, left knee: Secondary | ICD-10-CM | POA: Diagnosis not present

## 2015-07-10 NOTE — Progress Notes (Signed)
   Subjective:    Patient ID: Yeniel Harbold, male    DOB: 14-Jul-1940, 75 y.o.   MRN: [id number]  HPI Pt is a 75 yo male who presents to the clinic for pre-surgical clearance for TKA left knee on 08/21/15. He has no concerns or compliants today. He is feeling great.    Review of Systems  All other systems reviewed and are negative.      Objective:   Physical Exam  Constitutional: He is oriented to person, place, and time. He appears well-developed and well-nourished.  HENT:  Head: Normocephalic and atraumatic.  Cardiovascular: Normal rate, regular rhythm and normal heart sounds.   Pulmonary/Chest: Effort normal and breath sounds normal. He has no wheezes.  Neurological: He is alert and oriented to person, place, and time.  Skin: Skin is dry.  Psychiatric: He has a normal mood and affect. His behavior is normal.          Assessment & Plan:  Per-surgical clearance for TKA of left- EKG unchanged since last EKG done. No ST elevation or depression. NSR. No arrhythmias.  Cbc and cmp ordered today.  Vitals look great today.   CKD, stage III- will recheck cmp. He has stopped all NSAIDs even topical.

## 2015-07-11 LAB — CBC WITH DIFFERENTIAL/PLATELET
Basophils Absolute: 0 cells/uL (ref 0–200)
Basophils Relative: 0 %
Eosinophils Absolute: 0 cells/uL — ABNORMAL LOW (ref 15–500)
Eosinophils Relative: 0 %
HCT: 41.1 % (ref 38.5–50.0)
Hemoglobin: 13.7 g/dL (ref 13.2–17.1)
Lymphocytes Relative: 29 %
Lymphs Abs: 2001 cells/uL (ref 850–3900)
MCH: 33.1 pg — ABNORMAL HIGH (ref 27.0–33.0)
MCHC: 33.3 g/dL (ref 32.0–36.0)
MCV: 99.3 fL (ref 80.0–100.0)
MPV: 10 fL (ref 7.5–12.5)
Monocytes Absolute: 759 cells/uL (ref 200–950)
Monocytes Relative: 11 %
Neutro Abs: 4140 cells/uL (ref 1500–7800)
Neutrophils Relative %: 60 %
Platelets: 363 10*3/uL (ref 140–400)
RBC: 4.14 MIL/uL — ABNORMAL LOW (ref 4.20–5.80)
RDW: 13.9 % (ref 11.0–15.0)
WBC: 6.9 10*3/uL (ref 3.8–10.8)

## 2015-07-11 LAB — COMPLETE METABOLIC PANEL WITH GFR
ALT: 21 U/L (ref 9–46)
AST: 24 U/L (ref 10–35)
Albumin: 4.1 g/dL (ref 3.6–5.1)
Alkaline Phosphatase: 60 U/L (ref 40–115)
BUN: 14 mg/dL (ref 7–25)
CO2: 24 mmol/L (ref 20–31)
Calcium: 9 mg/dL (ref 8.6–10.3)
Chloride: 105 mmol/L (ref 98–110)
Creat: 1.29 mg/dL — ABNORMAL HIGH (ref 0.70–1.18)
GFR, Est African American: 63 mL/min (ref >=60)
GFR, Est Non African American: 54 mL/min — ABNORMAL LOW (ref >=60)
Glucose, Bld: 94 mg/dL (ref 65–99)
Potassium: 4.7 mmol/L (ref 3.5–5.3)
Sodium: 140 mmol/L (ref 135–146)
Total Bilirubin: 0.5 mg/dL (ref 0.2–1.2)
Total Protein: 7.6 g/dL (ref 6.1–8.1)

## 2015-07-25 DIAGNOSIS — M1712 Unilateral primary osteoarthritis, left knee: Secondary | ICD-10-CM | POA: Diagnosis not present

## 2015-08-03 DIAGNOSIS — H40013 Open angle with borderline findings, low risk, bilateral: Secondary | ICD-10-CM | POA: Diagnosis not present

## 2015-08-06 DIAGNOSIS — H40013 Open angle with borderline findings, low risk, bilateral: Secondary | ICD-10-CM | POA: Diagnosis not present

## 2015-08-09 NOTE — H&P (Signed)
TOTAL KNEE ADMISSION H&P  Patient is being admitted for left total knee arthroplasty.  Subjective:  Chief Complaint:     Left knee primary OA /pain  HPI: Albert Hicks, 75 y.o. male, has a history of pain and functional disability in the left knee due to arthritis and has failed non-surgical conservative treatments for greater than 12 weeks to include NSAID's and/or analgesics, corticosteriod injections, viscosupplementation injections, use of assistive devices and activity modification.  Onset of symptoms was gradual, starting >10 years ago with gradually worsening course since that time. The patient noted prior procedures on the knee to include  arthroscopy and menisectomy on the left knee(s).  Patient currently rates pain in the left knee(s) at 7 out of 10 with activity. Patient has worsening of pain with activity and weight bearing, pain that interferes with activities of daily living, pain with passive range of motion, crepitus and joint swelling.  Patient has evidence of periarticular osteophytes and joint space narrowing by imaging studies.There is no active infection.   Risks, benefits and expectations were discussed with the patient.  Risks including but not limited to the risk of anesthesia, blood clots, nerve damage, blood vessel damage, failure of the prosthesis, infection and up to and including death.  Patient understand the risks, benefits and expectations and wishes to proceed with surgery.   PCP: Iran Planas, PA-C  D/C Plans:      Home  Post-op Meds:       No Rx given  Tranexamic Acid:      To be given - IV   Decadron:      Is to be given  FYI:     ASA  Tramadol and APAP    Patient Active Problem List   Diagnosis Date Noted  . Primary osteoarthritis of left knee 07/10/2015  . Prediabetes 06/22/2015  . Elevated fasting glucose 06/08/2015  . Primary osteoarthritis of both knees 01/18/2015  . Abnormal ECG 06/14/2014  . Nonspecific abnormal electrocardiogram (ECG) (EKG)  05/02/2014  . Colon polyp 04/17/2014  . Osteoarthritis of both shoulders 07/15/2013  . Osteoarthritis of both knees 12/01/2012  . BPH (benign prostatic hypertrophy) with urinary obstruction 07/03/2012  . History of gout 06/14/2012  . Diarrhea 05/11/2012  . Pyelonephritis 05/09/2012  . Hypokalemia 05/09/2012  . Sepsis due to urinary tract infection ([place]) 05/09/2012  . Hyponatremia 05/09/2012  . Leukocytosis, unspecified 05/09/2012  . CKD (chronic kidney disease) stage 3, GFR 30-59 ml/min 03/23/2012  . Obstruction of kidney 02/23/2012  . Hyperlipidemia   . Hypertension    Past Medical History  Diagnosis Date  . Hyperlipidemia   . Hypertension   . Gross hematuria   . BPH (benign prostatic hypertrophy)   . Elevated PSA   . OA (osteoarthritis) knees  . Hydronephrosis, left   . Acid reflux OCCASIONALLY TAKE TUMS  . Complication of anesthesia     Past Surgical History  Procedure Laterality Date  . Total hip arthroplasty  07-09-2005  DR ALUISIO    RIGHT HIP OA  . Left knee surgery  1993  (APPROX)  . Appendectomy  AGE 66  . Cataract extraction w/ intraocular lens implant  2013    RIGHT EYE  . Cystoscopy  02/13/2012    Procedure: CYSTOSCOPY FLEXIBLE;  Surgeon: Hanley Ben, MD;  Location: [facility];  Service: Urology;  Laterality: N/A;  . Prostatectomy N/A 08/03/2012    Procedure: SIMPLE RETROPUBIC PROSTATECTOMY, removal of left double J stent. ;  Surgeon: Hanley Ben, MD;  Location: [facility];  Service: Urology;  Laterality: N/A;    No prescriptions prior to admission   Allergies  Allergen Reactions  . Lipitor [Atorvastatin] Other (See Comments)    Muscle ache  . Statins Other (See Comments)    Muscle soreness  . Tramadol Nausea Only    Social History  Substance Use Topics  . Smoking status: Never Smoker   . Smokeless tobacco: Never Used  . Alcohol Use: Yes     Comment: 2 glasses wine per night    Family History  Problem Relation Age of Onset  .  Hypertension Mother   . Cancer Father     prostate  . Hypertension Father   . Diabetes Father      Review of Systems  Constitutional: Negative.   HENT: Positive for tinnitus.   Eyes: Negative.   Respiratory: Negative.   Cardiovascular: Negative.   Gastrointestinal: Negative.   Genitourinary: Negative.   Musculoskeletal: Positive for joint pain.  Skin: Negative.   Neurological: Negative.   Endo/Heme/Allergies: Negative.   Psychiatric/Behavioral: Negative.     Objective:  Physical Exam  Constitutional: He is oriented to person, place, and time. He appears well-developed.  HENT:  Head: Normocephalic.  Mouth/Throat: He has dentures.  Eyes: Pupils are equal, round, and reactive to light.  Neck: Neck supple. No JVD present. No tracheal deviation present. No thyromegaly present.  Cardiovascular: Normal rate, regular rhythm, normal heart sounds and intact distal pulses.   Respiratory: Effort normal and breath sounds normal. No stridor. No respiratory distress. He has no wheezes.  GI: Soft. There is no tenderness. There is no guarding.  Musculoskeletal:       Left knee: He exhibits decreased range of motion, swelling and bony tenderness. He exhibits no ecchymosis, no deformity, no laceration and no erythema. Tenderness found.  Lymphadenopathy:    He has no cervical adenopathy.  Neurological: He is alert and oriented to person, place, and time.  Skin: Skin is warm and dry.  Psychiatric: He has a normal mood and affect.      Labs:  Estimated body mass index is 29.70 kg/(m^2) as calculated from the following:   Height as of 06/06/15: 6' (1.829 m).   Weight as of 06/06/15: 99.338 kg (219 lb).   Imaging Review Plain radiographs demonstrate severe degenerative joint disease of the left knee(s). The bone quality appears to be good for age and reported activity level.  Assessment/Plan:  End stage arthritis, left knee   The patient history, physical examination, clinical  judgment of the provider and imaging studies are consistent with end stage degenerative joint disease of the left knee(s) and total knee arthroplasty is deemed medically necessary. The treatment options including medical management, injection therapy arthroscopy and arthroplasty were discussed at length. The risks and benefits of total knee arthroplasty were presented and reviewed. The risks due to aseptic loosening, infection, stiffness, patella tracking problems, thromboembolic complications and other imponderables were discussed. The patient acknowledged the explanation, agreed to proceed with the plan and consent was signed. Patient is being admitted for inpatient treatment for surgery, pain control, PT, OT, prophylactic antibiotics, VTE prophylaxis, progressive ambulation and ADL's and discharge planning. The patient is planning to be discharged home.    [place] Pugh Kwan Shellhammer   PA-C  08/09/2015, 10:02 PM

## 2015-08-13 ENCOUNTER — Other Ambulatory Visit (HOSPITAL_COMMUNITY): Payer: Self-pay | Admitting: *Deleted

## 2015-08-13 NOTE — Patient Instructions (Addendum)
Albert Hicks  08/13/2015   Your procedure is scheduled on: Tuesday 08-21-15  Report to [facility] Main  Entrance take [facility]  elevators to 3rd floor to  [place] at  2:15 pm   Call this number if you have problems the morning of surgery [phone]   Remember: ONLY 1 PERSON MAY GO WITH YOU TO SHORT STAY TO GET  READY MORNING OF [place].  Do not eat food After Midnight, but may have clear liquids from midnight until 1100 am day of surgery, nothing by mouth after 1100 am day of surgery..     Take these medicines the morning of surgery with A SIP OF WATER: Amlodipine (Norvasc); May use eye drops if needed (bring bottle with you day of surgery)                               You may not have any metal on your body including hair pins and              piercings  Do not wear jewelry,  lotions, powders or colognes, deodorant                         Men may shave face and neck.   Do not bring valuables to the hospital. [place].  Contacts, dentures or bridgework may not be worn into surgery.  Leave suitcase in the car. After surgery it may be brought to your room.                    Please read over the following fact sheets you were given:MRSA INFORMATION SHEET; INCENTIVE SPIROMETER; BLOOD TRANSFUSION INFORMATION SHEET  _____________________________________________________________________                CLEAR LIQUID DIET   Foods Allowed                                                                     Foods Excluded  Coffee and tea, regular and decaf                             liquids that you cannot  Plain Jell-O in any flavor                                             see through such as: Fruit ices (not with fruit pulp)                                     milk, soups, orange juice  Iced Popsicles  All solid food Carbonated beverages, regular and diet                                     Cranberry, grape and apple juices Sports drinks like Gatorade Lightly seasoned clear broth or consume(fat free) Sugar, honey syrup  Sample Menu Breakfast                                Lunch                                     Supper Cranberry juice                    Beef broth                            Chicken broth Jell-O                                     Grape juice                           Apple juice Coffee or tea                        Jell-O                                      Popsicle                                                Coffee or tea                        Coffee or tea  _____________________________________________________________________  [facility] - Preparing for Surgery Before surgery, you can play an important role.  Because skin is not sterile, your skin needs to be as free of germs as possible.  You can reduce the number of germs on your skin by washing with CHG (chlorahexidine gluconate) soap before surgery.  CHG is an antiseptic cleaner which kills germs and bonds with the skin to continue killing germs even after washing. Please DO NOT use if you have an allergy to CHG or antibacterial soaps.  If your skin becomes reddened/irritated stop using the CHG and inform your nurse when you arrive at Short Stay. Do not shave (including legs and underarms) for at least 48 hours prior to the first CHG shower.  You may shave your face/neck. Please follow these instructions carefully:  1.  Shower with CHG Soap the night before surgery and the  morning of Surgery.  2.  If you choose to wash your hair, wash your hair first as usual with your  normal  shampoo.  3.  After you shampoo, rinse your hair and body thoroughly to remove the  shampoo.  4.  Use CHG as you would any other liquid soap.  You can apply chg directly  to the skin and wash                       Gently with a scrungie or clean washcloth.  5.  Apply the  CHG Soap to your body ONLY FROM THE NECK DOWN.   Do not use on face/ open                           Wound or open sores. Avoid contact with eyes, ears mouth and genitals (private parts).                       Wash face,  Genitals (private parts) with your normal soap.             6.  Wash thoroughly, paying special attention to the area where your surgery  will be performed.  7.  Thoroughly rinse your body with warm water from the neck down.  8.  DO NOT shower/wash with your normal soap after using and rinsing off  the CHG Soap.                9.  Pat yourself dry with a clean towel.            10.  Wear clean pajamas.            11.  Place clean sheets on your bed the night of your first shower and do not  sleep with pets. Day of Surgery : Do not apply any lotions/deodorants the morning of surgery.  Please wear clean clothes to the hospital/surgery center.  FAILURE TO FOLLOW THESE INSTRUCTIONS MAY RESULT IN THE CANCELLATION OF YOUR SURGERY PATIENT SIGNATURE_________________________________  NURSE SIGNATURE__________________________________  ________________________________________________________________________   [name]  An incentive spirometer is a tool that can help keep your lungs clear and active. This tool measures how well you are filling your lungs with each breath. Taking long deep breaths may help reverse or decrease the chance of developing breathing (pulmonary) problems (especially infection) following:  A long period of time when you are unable to move or be active. BEFORE THE PROCEDURE   If the spirometer includes an indicator to show your best effort, your nurse or respiratory therapist will set it to a desired goal.  If possible, sit up straight or lean slightly forward. Try not to slouch.  Hold the incentive spirometer in an upright position. INSTRUCTIONS FOR USE   Sit on the edge of your bed if possible, or sit up as far as you can in bed or on a  chair.  Hold the incentive spirometer in an upright position.  Breathe out normally.  Place the mouthpiece in your mouth and seal your lips tightly around it.  Breathe in slowly and as deeply as possible, raising the piston or the ball toward the top of the column.  Hold your breath for 3-5 seconds or for as long as possible. Allow the piston or ball to fall to the bottom of the column.  Remove the mouthpiece from your mouth and breathe out normally.  Rest for a few seconds and repeat Steps 1 through 7 at least 10 times every 1-2 hours when you are awake. Take your time and take a few normal breaths between deep breaths.  The spirometer may include an indicator to  show your best effort. Use the indicator as a goal to work toward during each repetition.  After each set of 10 deep breaths, practice coughing to be sure your lungs are clear. If you have an incision (the cut made at the time of surgery), support your incision when coughing by placing a pillow or rolled up towels firmly against it. Once you are able to get out of bed, walk around indoors and cough well. You may stop using the incentive spirometer when instructed by your caregiver.  RISKS AND COMPLICATIONS  Take your time so you do not get dizzy or light-headed.  If you are in pain, you may need to take or ask for pain medication before doing incentive spirometry. It is harder to take a deep breath if you are having pain. AFTER USE  Rest and breathe slowly and easily.  It can be helpful to keep track of a log of your progress. Your caregiver can provide you with a simple table to help with this. If you are using the spirometer at home, follow these instructions: [place] IF:   You are having difficultly using the spirometer.  You have trouble using the spirometer as often as instructed.  Your pain medication is not giving enough relief while using the spirometer.  You develop fever of 100.5 F (38.1 C) or  higher. SEEK IMMEDIATE MEDICAL CARE IF:   You cough up bloody sputum that had not been present before.  You develop fever of 102 F (38.9 C) or greater.  You develop worsening pain at or near the incision site. MAKE SURE YOU:   Understand these instructions.  Will watch your condition.  Will get help right away if you are not doing well or get worse. Document Released: 07/21/2006 Document Revised: 06/02/2011 Document Reviewed: 09/21/2006 ExitCare Patient Information 2014 ExitCare, [place].   ________________________________________________________________________  WHAT IS A BLOOD TRANSFUSION? Blood Transfusion Information  A transfusion is the replacement of blood or some of its parts. Blood is made up of multiple cells which provide different functions.  Red blood cells carry oxygen and are used for blood loss replacement.  White blood cells fight against infection.  Platelets control bleeding.  Plasma helps clot blood.  Other blood products are available for specialized needs, such as hemophilia or other clotting disorders. BEFORE THE TRANSFUSION  Who gives blood for transfusions?   Healthy volunteers who are fully evaluated to make sure their blood is safe. This is blood bank blood. Transfusion therapy is the safest it has ever been in the practice of medicine. Before blood is taken from a donor, a complete history is taken to make sure that person has no history of diseases nor engages in risky social behavior (examples are intravenous drug use or sexual activity with multiple partners). The donor's travel history is screened to minimize risk of transmitting infections, such as malaria. The donated blood is tested for signs of infectious diseases, such as HIV and hepatitis. The blood is then tested to be sure it is compatible with you in order to minimize the chance of a transfusion reaction. If you or a relative donates blood, this is often done in anticipation of surgery  and is not appropriate for emergency situations. It takes many days to process the donated blood. RISKS AND COMPLICATIONS Although transfusion therapy is very safe and saves many lives, the main dangers of transfusion include:   Getting an infectious disease.  Developing a transfusion reaction. This is an allergic reaction  to something in the blood you were given. Every precaution is taken to prevent this. The decision to have a blood transfusion has been considered carefully by your caregiver before blood is given. Blood is not given unless the benefits outweigh the risks. AFTER THE TRANSFUSION  Right after receiving a blood transfusion, you will usually feel much better and more energetic. This is especially true if your red blood cells have gotten low (anemic). The transfusion raises the level of the red blood cells which carry oxygen, and this usually causes an energy increase.  The nurse administering the transfusion will monitor you carefully for complications. HOME CARE INSTRUCTIONS  No special instructions are needed after a transfusion. You may find your energy is better. Speak with your caregiver about any limitations on activity for underlying diseases you may have. SEEK MEDICAL CARE IF:   Your condition is not improving after your transfusion.  You develop redness or irritation at the intravenous (IV) site. SEEK IMMEDIATE MEDICAL CARE IF:  Any of the following symptoms occur over the next 12 hours:  Shaking chills.  You have a temperature by mouth above 102 F (38.9 C), not controlled by medicine.  Chest, back, or muscle pain.  People around you feel you are not acting correctly or are confused.  Shortness of breath or difficulty breathing.  Dizziness and fainting.  You get a rash or develop hives.  You have a decrease in urine output.  Your urine turns a dark color or changes to pink, red, or brown. Any of the following symptoms occur over the next 10  days:  You have a temperature by mouth above 102 F (38.9 C), not controlled by medicine.  Shortness of breath.  Weakness after normal activity.  The white part of the eye turns yellow (jaundice).  You have a decrease in the amount of urine or are urinating less often.  Your urine turns a dark color or changes to pink, red, or brown. Document Released: 03/07/2000 Document Revised: 06/02/2011 Document Reviewed: 10/25/2007 [facility] Patient Information 2014 [place], [place].  _______________________________________________________________________

## 2015-08-13 NOTE — Progress Notes (Signed)
Medical clearance note Luvenia Starch breedback pa on chart 07-10-15 and in epic  ekg 07-10-15 on chart [facility] practive Jule Ser

## 2015-08-14 ENCOUNTER — Encounter (HOSPITAL_COMMUNITY)
Admission: RE | Admit: 2015-08-14 | Discharge: 2015-08-14 | Disposition: A | Discharge status: Home / Self Care | Payer: Medicare Other | Source: Ambulatory Visit | Attending: Orthopedic Surgery | Admitting: Orthopedic Surgery

## 2015-08-14 ENCOUNTER — Encounter (HOSPITAL_COMMUNITY): Payer: Self-pay

## 2015-08-14 DIAGNOSIS — M1712 Unilateral primary osteoarthritis, left knee: Secondary | ICD-10-CM | POA: Insufficient documentation

## 2015-08-14 DIAGNOSIS — Z01812 Encounter for preprocedural laboratory examination: Secondary | ICD-10-CM | POA: Insufficient documentation

## 2015-08-14 HISTORY — DX: Other specified postprocedural states: Z98.890

## 2015-08-14 HISTORY — DX: Tinnitus, unspecified ear: H93.19

## 2015-08-14 HISTORY — DX: Nausea with vomiting, unspecified: R11.2

## 2015-08-14 HISTORY — DX: Presence of spectacles and contact lenses: Z97.3

## 2015-08-14 LAB — CBC
HCT: 39.9 % (ref 39.0–52.0)
Hemoglobin: 13.6 g/dL (ref 13.0–17.0)
MCH: 33.3 pg (ref 26.0–34.0)
MCHC: 34.1 g/dL (ref 30.0–36.0)
MCV: 97.6 fL (ref 78.0–100.0)
Platelets: 340 10*3/uL (ref 150–400)
RBC: 4.09 MIL/uL — ABNORMAL LOW (ref 4.22–5.81)
RDW: 13.4 % (ref 11.5–15.5)
WBC: 6.3 10*3/uL (ref 4.0–10.5)

## 2015-08-14 LAB — BASIC METABOLIC PANEL
Anion gap: 8 (ref 5–15)
BUN: 12 mg/dL (ref 6–20)
CO2: 25 mmol/L (ref 22–32)
Calcium: 8.9 mg/dL (ref 8.9–10.3)
Chloride: 105 mmol/L (ref 101–111)
Creatinine, Ser: 1.31 mg/dL — ABNORMAL HIGH (ref 0.61–1.24)
GFR calc Af Amer: >60 mL/min (ref >60)
GFR calc non Af Amer: 52 mL/min — ABNORMAL LOW (ref >60)
Glucose, Bld: 92 mg/dL (ref 65–99)
Potassium: 4.2 mmol/L (ref 3.5–5.1)
Sodium: 138 mmol/L (ref 135–145)

## 2015-08-14 LAB — SURGICAL PCR SCREEN
MRSA, PCR: NEGATIVE
Staphylococcus aureus: NEGATIVE

## 2015-08-21 ENCOUNTER — Inpatient Hospital Stay (HOSPITAL_COMMUNITY): Payer: Medicare Other | Admitting: Registered Nurse

## 2015-08-21 ENCOUNTER — Encounter (HOSPITAL_COMMUNITY)
Admission: RE | Disposition: A | Discharge status: Home Health | Payer: Self-pay | Source: Ambulatory Visit | Attending: Orthopedic Surgery

## 2015-08-21 ENCOUNTER — Encounter (HOSPITAL_COMMUNITY): Payer: Self-pay | Admitting: *Deleted

## 2015-08-21 ENCOUNTER — Inpatient Hospital Stay (HOSPITAL_COMMUNITY)
Admission: RE | Admit: 2015-08-21 | Discharge: 2015-08-22 | DRG: 470 | Disposition: A | Discharge status: Home Health | Payer: Medicare Other | Source: Ambulatory Visit | Attending: Orthopedic Surgery | Admitting: Orthopedic Surgery

## 2015-08-21 DIAGNOSIS — E785 Hyperlipidemia, unspecified: Secondary | ICD-10-CM | POA: Diagnosis not present

## 2015-08-21 DIAGNOSIS — I129 Hypertensive chronic kidney disease with stage 1 through stage 4 chronic kidney disease, or unspecified chronic kidney disease: Secondary | ICD-10-CM | POA: Diagnosis not present

## 2015-08-21 DIAGNOSIS — Z96641 Presence of right artificial hip joint: Secondary | ICD-10-CM | POA: Diagnosis present

## 2015-08-21 DIAGNOSIS — M1712 Unilateral primary osteoarthritis, left knee: Secondary | ICD-10-CM | POA: Diagnosis not present

## 2015-08-21 DIAGNOSIS — M179 Osteoarthritis of knee, unspecified: Secondary | ICD-10-CM | POA: Diagnosis not present

## 2015-08-21 DIAGNOSIS — M659 Synovitis and tenosynovitis, unspecified: Secondary | ICD-10-CM | POA: Diagnosis present

## 2015-08-21 DIAGNOSIS — Z6828 Body mass index (BMI) 28.0-28.9, adult: Secondary | ICD-10-CM | POA: Diagnosis not present

## 2015-08-21 DIAGNOSIS — N183 Chronic kidney disease, stage 3 (moderate): Secondary | ICD-10-CM | POA: Diagnosis not present

## 2015-08-21 DIAGNOSIS — K219 Gastro-esophageal reflux disease without esophagitis: Secondary | ICD-10-CM | POA: Diagnosis present

## 2015-08-21 DIAGNOSIS — M25562 Pain in left knee: Secondary | ICD-10-CM | POA: Diagnosis not present

## 2015-08-21 DIAGNOSIS — M170 Bilateral primary osteoarthritis of knee: Principal | ICD-10-CM | POA: Diagnosis present

## 2015-08-21 DIAGNOSIS — Z96659 Presence of unspecified artificial knee joint: Secondary | ICD-10-CM

## 2015-08-21 DIAGNOSIS — E663 Overweight: Secondary | ICD-10-CM | POA: Diagnosis present

## 2015-08-21 DIAGNOSIS — Z96652 Presence of left artificial knee joint: Secondary | ICD-10-CM

## 2015-08-21 HISTORY — PX: TOTAL KNEE ARTHROPLASTY: SHX125

## 2015-08-21 LAB — TYPE AND SCREEN
ABO/RH(D): O POS
Antibody Screen: NEGATIVE

## 2015-08-21 SURGERY — ARTHROPLASTY, KNEE, TOTAL
Anesthesia: Spinal | Site: Knee | Laterality: Left

## 2015-08-21 MED ORDER — CELECOXIB 200 MG PO CAPS
200.0000 mg | ORAL_CAPSULE | Freq: Two times a day (BID) | ORAL | Status: DC
  Administered 2015-08-21: 200 mg via ORAL
  Filled 2015-08-21 (×3): qty 1

## 2015-08-21 MED ORDER — KETOROLAC TROMETHAMINE 30 MG/ML IJ SOLN
INTRAMUSCULAR | Status: DC | PRN
  Administered 2015-08-21: 30 mg

## 2015-08-21 MED ORDER — DEXAMETHASONE SODIUM PHOSPHATE 10 MG/ML IJ SOLN
10.0000 mg | Freq: Once | INTRAMUSCULAR | Status: AC
  Administered 2015-08-21: 10 mg via INTRAVENOUS

## 2015-08-21 MED ORDER — MIDAZOLAM HCL 2 MG/2ML IJ SOLN
INTRAMUSCULAR | Status: AC
  Filled 2015-08-21: qty 2

## 2015-08-21 MED ORDER — KETOROLAC TROMETHAMINE 30 MG/ML IJ SOLN
INTRAMUSCULAR | Status: AC
  Filled 2015-08-21: qty 1

## 2015-08-21 MED ORDER — HYDROMORPHONE HCL 1 MG/ML IJ SOLN: 0.5000 mg | INTRAMUSCULAR | Status: DC | PRN

## 2015-08-21 MED ORDER — SODIUM CHLORIDE 0.9 % IJ SOLN
INTRAMUSCULAR | Status: AC
  Filled 2015-08-21: qty 50

## 2015-08-21 MED ORDER — BISACODYL 10 MG RE SUPP
10.0000 mg | Freq: Every day | RECTAL | Status: DC | PRN
Start: 2015-08-21 — End: 2015-08-22

## 2015-08-21 MED ORDER — TRAMADOL HCL 50 MG PO TABS
50.0000 mg | ORAL_TABLET | Freq: Four times a day (QID) | ORAL | Status: DC
  Administered 2015-08-21 – 2015-08-22 (×2): 100 mg via ORAL
  Filled 2015-08-21 (×2): qty 2

## 2015-08-21 MED ORDER — MIDAZOLAM HCL 5 MG/5ML IJ SOLN
INTRAMUSCULAR | Status: DC | PRN
  Administered 2015-08-21: 2 mg via INTRAVENOUS

## 2015-08-21 MED ORDER — BUPIVACAINE-EPINEPHRINE (PF) 0.25% -1:200000 IJ SOLN
INTRAMUSCULAR | Status: DC | PRN
  Administered 2015-08-21: 30 mL

## 2015-08-21 MED ORDER — BUPIVACAINE-EPINEPHRINE (PF) 0.25% -1:200000 IJ SOLN
INTRAMUSCULAR | Status: AC
  Filled 2015-08-21: qty 30

## 2015-08-21 MED ORDER — ACETAMINOPHEN 650 MG RE SUPP: 650.0000 mg | Freq: Four times a day (QID) | RECTAL | Status: DC | PRN

## 2015-08-21 MED ORDER — FERROUS SULFATE 325 (65 FE) MG PO TABS
325.0000 mg | ORAL_TABLET | Freq: Three times a day (TID) | ORAL | Status: DC
  Administered 2015-08-22: 325 mg via ORAL
  Filled 2015-08-21 (×4): qty 1

## 2015-08-21 MED ORDER — AMLODIPINE BESYLATE 10 MG PO TABS
10.0000 mg | ORAL_TABLET | Freq: Every day | ORAL | Status: DC
  Administered 2015-08-22: 10 mg via ORAL
  Filled 2015-08-21: qty 1

## 2015-08-21 MED ORDER — CEFAZOLIN SODIUM-DEXTROSE 2-4 GM/100ML-% IV SOLN
2.0000 g | Freq: Four times a day (QID) | INTRAVENOUS | Status: AC
  Administered 2015-08-21 – 2015-08-22 (×2): 2 g via INTRAVENOUS
  Filled 2015-08-21 (×2): qty 100

## 2015-08-21 MED ORDER — ACETAMINOPHEN 325 MG PO TABS: 650.0000 mg | ORAL_TABLET | Freq: Four times a day (QID) | ORAL | Status: DC | PRN

## 2015-08-21 MED ORDER — MENTHOL 3 MG MT LOZG: 1.0000 | LOZENGE | OROMUCOSAL | Status: DC | PRN

## 2015-08-21 MED ORDER — DOCUSATE SODIUM 100 MG PO CAPS
100.0000 mg | ORAL_CAPSULE | Freq: Two times a day (BID) | ORAL | Status: DC
  Administered 2015-08-21 – 2015-08-22 (×2): 100 mg via ORAL
  Filled 2015-08-21 (×4): qty 1

## 2015-08-21 MED ORDER — TRANEXAMIC ACID 1000 MG/10ML IV SOLN
1000.0000 mg | Freq: Once | INTRAVENOUS | Status: AC
  Administered 2015-08-21: 1000 mg via INTRAVENOUS
  Filled 2015-08-21: qty 10

## 2015-08-21 MED ORDER — ONDANSETRON HCL 4 MG PO TABS
4.0000 mg | ORAL_TABLET | Freq: Four times a day (QID) | ORAL | Status: DC | PRN
  Administered 2015-08-22: 4 mg via ORAL
  Filled 2015-08-21: qty 1

## 2015-08-21 MED ORDER — SODIUM CHLORIDE 0.9 % IR SOLN
Status: DC | PRN
  Administered 2015-08-21: 1000 mL

## 2015-08-21 MED ORDER — LACTATED RINGERS IV SOLN
INTRAVENOUS | Status: DC
  Administered 2015-08-21 (×2): via INTRAVENOUS

## 2015-08-21 MED ORDER — HYDROMORPHONE HCL 1 MG/ML IJ SOLN: 0.2500 mg | INTRAMUSCULAR | Status: DC | PRN

## 2015-08-21 MED ORDER — ONDANSETRON HCL 4 MG/2ML IJ SOLN
INTRAMUSCULAR | Status: AC
  Filled 2015-08-21: qty 2

## 2015-08-21 MED ORDER — METHOCARBAMOL 1000 MG/10ML IJ SOLN
500.0000 mg | Freq: Four times a day (QID) | INTRAVENOUS | Status: DC | PRN
  Filled 2015-08-21: qty 5

## 2015-08-21 MED ORDER — ALUM & MAG HYDROXIDE-SIMETH 200-200-20 MG/5ML PO SUSP: 30.0000 mL | ORAL | Status: DC | PRN

## 2015-08-21 MED ORDER — CHLORHEXIDINE GLUCONATE 4 % EX LIQD: 60.0000 mL | Freq: Once | CUTANEOUS | Status: DC

## 2015-08-21 MED ORDER — PRAVASTATIN SODIUM 40 MG PO TABS
40.0000 mg | ORAL_TABLET | Freq: Every day | ORAL | Status: DC
  Filled 2015-08-21: qty 1

## 2015-08-21 MED ORDER — PROPOFOL 500 MG/50ML IV EMUL
INTRAVENOUS | Status: DC | PRN
  Administered 2015-08-21: 50 ug/kg/min via INTRAVENOUS

## 2015-08-21 MED ORDER — FENTANYL CITRATE (PF) 100 MCG/2ML IJ SOLN
INTRAMUSCULAR | Status: DC | PRN
  Administered 2015-08-21: 25 ug via INTRAVENOUS
  Administered 2015-08-21: 50 ug via INTRAVENOUS
  Administered 2015-08-21: 25 ug via INTRAVENOUS

## 2015-08-21 MED ORDER — DEXAMETHASONE SODIUM PHOSPHATE 10 MG/ML IJ SOLN
10.0000 mg | Freq: Once | INTRAMUSCULAR | Status: DC
  Filled 2015-08-21: qty 1

## 2015-08-21 MED ORDER — MAGNESIUM CITRATE PO SOLN: 1.0000 | Freq: Once | ORAL | Status: DC | PRN

## 2015-08-21 MED ORDER — PROPOFOL 10 MG/ML IV BOLUS
INTRAVENOUS | Status: AC
  Filled 2015-08-21: qty 60

## 2015-08-21 MED ORDER — ASPIRIN EC 325 MG PO TBEC
325.0000 mg | DELAYED_RELEASE_TABLET | Freq: Two times a day (BID) | ORAL | Status: DC
  Administered 2015-08-22: 325 mg via ORAL
  Filled 2015-08-21 (×3): qty 1

## 2015-08-21 MED ORDER — CEFAZOLIN SODIUM-DEXTROSE 2-4 GM/100ML-% IV SOLN
2.0000 g | INTRAVENOUS | Status: AC
  Administered 2015-08-21: 2 g via INTRAVENOUS
  Filled 2015-08-21: qty 100

## 2015-08-21 MED ORDER — ONDANSETRON HCL 4 MG/2ML IJ SOLN
INTRAMUSCULAR | Status: DC | PRN
  Administered 2015-08-21: 4 mg via INTRAVENOUS

## 2015-08-21 MED ORDER — CEFAZOLIN SODIUM-DEXTROSE 2-4 GM/100ML-% IV SOLN
INTRAVENOUS | Status: AC
  Filled 2015-08-21: qty 100

## 2015-08-21 MED ORDER — POLYETHYLENE GLYCOL 3350 17 G PO PACK
17.0000 g | PACK | Freq: Two times a day (BID) | ORAL | Status: DC
Start: 2015-08-21 — End: 2015-08-22
  Administered 2015-08-21: 17 g via ORAL
  Filled 2015-08-21 (×3): qty 1

## 2015-08-21 MED ORDER — METOCLOPRAMIDE HCL 10 MG PO TABS
5.0000 mg | ORAL_TABLET | Freq: Three times a day (TID) | ORAL | Status: DC | PRN
  Administered 2015-08-22: 10 mg via ORAL
  Filled 2015-08-21: qty 1

## 2015-08-21 MED ORDER — PROPOFOL 10 MG/ML IV BOLUS
INTRAVENOUS | Status: DC | PRN
  Administered 2015-08-21: 20 mg via INTRAVENOUS

## 2015-08-21 MED ORDER — PHENOL 1.4 % MT LIQD: 1.0000 | OROMUCOSAL | Status: DC | PRN

## 2015-08-21 MED ORDER — DIPHENHYDRAMINE HCL 25 MG PO CAPS: 25.0000 mg | ORAL_CAPSULE | Freq: Four times a day (QID) | ORAL | Status: DC | PRN

## 2015-08-21 MED ORDER — ONDANSETRON HCL 4 MG/2ML IJ SOLN: 4.0000 mg | Freq: Four times a day (QID) | INTRAMUSCULAR | Status: DC | PRN

## 2015-08-21 MED ORDER — METHOCARBAMOL 500 MG PO TABS
500.0000 mg | ORAL_TABLET | Freq: Four times a day (QID) | ORAL | Status: DC | PRN
  Administered 2015-08-22: 500 mg via ORAL
  Filled 2015-08-21: qty 1

## 2015-08-21 MED ORDER — FENTANYL CITRATE (PF) 100 MCG/2ML IJ SOLN
INTRAMUSCULAR | Status: AC
  Filled 2015-08-21: qty 2

## 2015-08-21 MED ORDER — METOCLOPRAMIDE HCL 5 MG/ML IJ SOLN: 10.0000 mg | Freq: Once | INTRAMUSCULAR | Status: DC | PRN

## 2015-08-21 MED ORDER — DEXAMETHASONE SODIUM PHOSPHATE 10 MG/ML IJ SOLN
INTRAMUSCULAR | Status: AC
  Filled 2015-08-21: qty 1

## 2015-08-21 MED ORDER — BUPIVACAINE IN DEXTROSE 0.75-8.25 % IT SOLN
INTRATHECAL | Status: DC | PRN
  Administered 2015-08-21: 1.8 mL via INTRATHECAL

## 2015-08-21 MED ORDER — METOCLOPRAMIDE HCL 5 MG/ML IJ SOLN: 5.0000 mg | Freq: Three times a day (TID) | INTRAMUSCULAR | Status: DC | PRN

## 2015-08-21 MED ORDER — 0.9 % SODIUM CHLORIDE (POUR BTL) OPTIME
TOPICAL | Status: DC | PRN
  Administered 2015-08-21: 1000 mL

## 2015-08-21 MED ORDER — SODIUM CHLORIDE 0.9 % IV SOLN
INTRAVENOUS | Status: DC
  Administered 2015-08-21: 22:00:00 via INTRAVENOUS

## 2015-08-21 MED ORDER — SODIUM CHLORIDE 0.9 % IJ SOLN
INTRAMUSCULAR | Status: DC | PRN
  Administered 2015-08-21: 30 mL

## 2015-08-21 SURGICAL SUPPLY — 42 items
BAG SPEC THK2 15X12 ZIP CLS (MISCELLANEOUS)
BAG ZIPLOCK 12X15 (MISCELLANEOUS) IMPLANT
BANDAGE ACE 6X5 VEL STRL LF (GAUZE/BANDAGES/DRESSINGS) ×2 IMPLANT
BLADE SAW SGTL 13.0X1.19X90.0M (BLADE) ×2 IMPLANT
BOWL SMART MIX CTS (DISPOSABLE) ×2 IMPLANT
CAPT KNEE TOTAL 3 ATTUNE ×1 IMPLANT
CEMENT HV SMART SET (Cement) ×2 IMPLANT
CLOTH BEACON ORANGE TIMEOUT ST (SAFETY) ×2 IMPLANT
CUFF TOURN SGL QUICK 34 (TOURNIQUET CUFF) ×2
CUFF TRNQT CYL 34X4X40X1 (TOURNIQUET CUFF) ×1 IMPLANT
DECANTER SPIKE VIAL GLASS SM (MISCELLANEOUS) ×2 IMPLANT
DRAPE U-SHAPE 47X51 STRL (DRAPES) ×2 IMPLANT
DRESSING AQUACEL AG SP 3.5X10 (GAUZE/BANDAGES/DRESSINGS) ×1 IMPLANT
DRSG AQUACEL AG SP 3.5X10 (GAUZE/BANDAGES/DRESSINGS) ×2
DURAPREP 26ML APPLICATOR (WOUND CARE) ×4 IMPLANT
ELECT REM PT RETURN 9FT ADLT (ELECTROSURGICAL) ×2
ELECTRODE REM PT RTRN 9FT ADLT (ELECTROSURGICAL) ×1 IMPLANT
GLOVE BIOGEL PI IND STRL 7.5 (GLOVE) ×1 IMPLANT
GLOVE BIOGEL PI IND STRL 8.5 (GLOVE) ×1 IMPLANT
GLOVE BIOGEL PI INDICATOR 7.5 (GLOVE) ×1
GLOVE BIOGEL PI INDICATOR 8.5 (GLOVE) ×1
GLOVE ECLIPSE 8.0 STRL XLNG CF (GLOVE) ×2 IMPLANT
GLOVE ORTHO TXT STRL SZ7.5 (GLOVE) ×4 IMPLANT
GOWN STRL REUS W/TWL LRG LVL3 (GOWN DISPOSABLE) ×2 IMPLANT
GOWN STRL REUS W/TWL XL LVL3 (GOWN DISPOSABLE) ×2 IMPLANT
HANDPIECE INTERPULSE COAX TIP (DISPOSABLE) ×2
LIQUID BAND (GAUZE/BANDAGES/DRESSINGS) ×2 IMPLANT
MANIFOLD NEPTUNE II (INSTRUMENTS) ×2 IMPLANT
PACK TOTAL KNEE CUSTOM (KITS) ×2 IMPLANT
POSITIONER SURGICAL ARM (MISCELLANEOUS) ×2 IMPLANT
SET HNDPC FAN SPRY TIP SCT (DISPOSABLE) ×1 IMPLANT
SET PAD KNEE POSITIONER (MISCELLANEOUS) ×2 IMPLANT
SUT MNCRL AB 4-0 PS2 18 (SUTURE) ×2 IMPLANT
SUT VIC AB 1 CT1 36 (SUTURE) ×2 IMPLANT
SUT VIC AB 2-0 CT1 27 (SUTURE) ×6
SUT VIC AB 2-0 CT1 TAPERPNT 27 (SUTURE) ×3 IMPLANT
SUT VLOC 180 0 24IN GS25 (SUTURE) ×2 IMPLANT
SYR 50ML LL SCALE MARK (SYRINGE) ×2 IMPLANT
TRAY FOLEY W/METER SILVER 16FR (SET/KITS/TRAYS/PACK) ×2 IMPLANT
WATER STERILE IRR 1500ML POUR (IV SOLUTION) ×2 IMPLANT
WRAP KNEE MAXI GEL POST OP (GAUZE/BANDAGES/DRESSINGS) ×2 IMPLANT
YANKAUER SUCT BULB TIP 10FT TU (MISCELLANEOUS) ×2 IMPLANT

## 2015-08-21 NOTE — Anesthesia Postprocedure Evaluation (Signed)
Anesthesia Post Note  Patient: Albert Hicks  Procedure(s) Performed: Procedure(s) (LRB): LEFT TOTAL KNEE ARTHROPLASTY (Left)  Patient location during evaluation: PACU Anesthesia Type: Spinal Level of consciousness: oriented and awake and alert Pain management: pain level controlled Vital Signs Assessment: post-procedure vital signs reviewed and stable Respiratory status: spontaneous breathing, respiratory function stable and patient connected to nasal cannula oxygen Cardiovascular status: blood pressure returned to baseline and stable Postop Assessment: no headache and no backache Anesthetic complications: no    Last Vitals:  Filed Vitals:   08/21/15 1930 08/21/15 1946  BP: 146/78 140/81  Pulse: 63 62  Temp: 36.4 C 36.5 C  Resp: 15 16    Last Pain: There were no vitals filed for this visit.               Zenaida Deed

## 2015-08-21 NOTE — Anesthesia Preprocedure Evaluation (Signed)
Anesthesia Evaluation  Patient identified by MRN, date of birth, ID band Patient awake    Reviewed: Allergy & Precautions, H&P , NPO status , Patient's Chart, lab work & pertinent test results  History of Anesthesia Complications (+) PONV and history of anesthetic complications  Airway Mallampati: II  TM Distance: >3 FB Neck ROM: full    Dental no notable dental hx.    Pulmonary neg pulmonary ROS,    Pulmonary exam normal breath sounds clear to auscultation       Cardiovascular hypertension, Pt. on medications Normal cardiovascular exam Rhythm:regular Rate:Normal     Neuro/Psych negative neurological ROS     GI/Hepatic Neg liver ROS, GERD  ,  Endo/Other  negative endocrine ROS  Renal/GU CRFRenal disease     Musculoskeletal  (+) Arthritis ,   Abdominal   Peds  Hematology negative hematology ROS (+)   Anesthesia Other Findings   Reproductive/Obstetrics negative OB ROS                             Anesthesia Physical Anesthesia Plan  ASA: II  Anesthesia Plan: Spinal   Post-op Pain Management:    Induction: Intravenous  Airway Management Planned: Simple Face Mask  Additional Equipment:   Intra-op Plan:   Post-operative Plan:   Informed Consent: I have reviewed the patients History and Physical, chart, labs and discussed the procedure including the risks, benefits and alternatives for the proposed anesthesia with the patient or authorized representative who has indicated his/her understanding and acceptance.   Dental Advisory Given  Plan Discussed with: Anesthesiologist, CRNA and Surgeon  Anesthesia Plan Comments:         Anesthesia Quick Evaluation

## 2015-08-21 NOTE — Anesthesia Procedure Notes (Addendum)
Spinal Patient location during procedure: OR Start time: 08/21/2015 4:49 PM End time: 08/21/2015 4:56 PM Reason for block: procedure for pain Staffing Resident/CRNA: Lajuana Carry E Performed by: resident/CRNA  Preanesthetic Checklist Completed: patient identified, site marked, surgical consent, pre-op evaluation, timeout performed, IV checked, risks and benefits discussed and monitors and equipment checked Spinal Block Patient position: sitting Prep: ChloraPrep Patient monitoring: heart rate, continuous pulse ox and blood pressure Approach: midline Location: L3-4 Injection technique: single-shot Needle Needle type: Sprotte  Needle gauge: 24 G

## 2015-08-21 NOTE — Op Note (Signed)
NAME:  Albert Hicks                      MEDICAL RECORD NO.:  [id number]                             FACILITY:  [facility]      PHYSICIAN:  Pietro Cassis. Alvan Dame, M.D.  DATE OF BIRTH:  [birth year]      DATE OF PROCEDURE:  08/21/2015                                     OPERATIVE REPORT         PREOPERATIVE DIAGNOSIS:  Left knee osteoarthritis.      POSTOPERATIVE DIAGNOSIS:  Left knee osteoarthritis.      FINDINGS:  The patient was noted to have complete loss of cartilage and   bone-on-bone arthritis with associated osteophytes in all three compartments of   the knee with a significant synovitis and associated effusion.      PROCEDURE:  Left total knee replacement.      COMPONENTS USED:  DePuy Attune rotating platform posterior stabilized knee   system, a size 8 femur, 8 tibia, size 5 mm PS AOX insert, and 38 anatomic patellar   button.      SURGEON:  Pietro Cassis. Alvan Dame, M.D.      ASSISTANT:  Danae Orleans, PA-C.      ANESTHESIA:  Spinal.      SPECIMENS:  None.      COMPLICATION:  None.      DRAINS:  One Hemovac.  EBL: <100      TOURNIQUET TIME:   Total Tourniquet Time Documented: Thigh (Left) - 39 minutes Total: Thigh (Left) - 39 minutes  .      The patient was stable to the recovery room.      INDICATION FOR PROCEDURE:  Albert Hicks is a 75 y.o. male patient of   mine.  The patient had been seen, evaluated, and treated conservatively in the   office with medication, activity modification, and injections.  The patient had   radiographic changes of bone-on-bone arthritis with endplate sclerosis and osteophytes noted.      The patient failed conservative measures including medication, injections, and activity modification, and at this point was ready for more definitive measures.   Based on the radiographic changes and failed conservative measures, the patient   decided to proceed with total knee replacement.  Risks of infection,   DVT, component failure, need for revision  surgery, postop course, and   expectations were all   discussed and reviewed.  Consent was obtained for benefit of pain   relief.      PROCEDURE IN DETAIL:  The patient was brought to the operative theater.   Once adequate anesthesia, preoperative antibiotics, 2 gm of Ancef, 1 gm of Tranexamic Acid, and 10 mg of Decadron administered, the patient was positioned supine with the left thigh tourniquet placed.  The  left lower extremity was prepped and draped in sterile fashion.  A time-   out was performed identifying the patient, planned procedure, and   extremity.      The left lower extremity was placed in the [facility] leg holder.  The leg was   exsanguinated, tourniquet elevated to 250 mmHg.  A midline incision was   made followed by median  parapatellar arthrotomy.  Following initial   exposure, attention was first directed to the patella.  Precut   measurement was noted to be 24 mm.  I resected down to 14-15 mm and used a   38 patellar button to restore patellar height as well as cover the cut   surface.      The lug holes were drilled and a metal shim was placed to protect the   patella from retractors and saw blades.      At this point, attention was now directed to the femur.  The femoral   canal was opened with a drill, irrigated to try to prevent fat emboli.  An   intramedullary rod was passed at 5 degrees valgus, 10 mm of bone was   resected off the distal femur due to pre-operative flexion contracture.  Following this resection, the tibia was   subluxated anteriorly.  Using the extramedullary guide, 2 mm of bone was resected off   the proximal medial tibia.  We confirmed the gap would be   stable medially and laterally with a size 21mm insert as well as confirmed   the cut was perpendicular in the coronal plane, checking with an alignment rod.      Once this was done, I sized the femur to be a size 8 in the anterior-   posterior dimension, chose a standard component based on  medial and   lateral dimension.  The size 8 rotation block was then pinned in   position anterior referenced using the C-clamp to set rotation.  The   anterior, posterior, and  chamfer cuts were made without difficulty nor   notching making certain that I was along the anterior cortex to help   with flexion gap stability.      The final box cut was made off the lateral aspect of distal femur.      At this point, the tibia was sized to be a size 8, the size 8 tray was   then pinned in position through the medial third of the tubercle,   drilled, and keel punched.  Trial reduction was now carried with a 8 femur,  8 tibia, a size 5 mm insert, and the 38 patella botton.  The knee was brought to   extension, full extension with good flexion stability with the patella   tracking through the trochlea without application of pressure.  Given   all these findings the femoral lug holes were drilled and then the trial components removed.  Final components were   opened and cement was mixed.  The knee was irrigated with normal saline   solution and pulse lavage.  The synovial lining was   then injected with 30 cc of 0.25% Marcaine with epinephrine and 1 cc of Toradol plus 30 cc of NS for a  total of 61 cc.      The knee was irrigated.  Final implants were then cemented onto clean and   dried cut surfaces of bone with the knee brought to extension with a size 5   mm trial insert.      Once the cement had fully cured, the excess cement was removed   throughout the knee.  I confirmed I was satisfied with the range of   motion and stability, and the final size 5 mm PS AOX insert was chosen.  It was   placed into the knee.      The tourniquet had been let down at 39  minutes.  No significant   hemostasis required.  The   extensor mechanism was then reapproximated using #1 Vicryl and #0 V-lock sutures with the knee   in flexion.  The   remaining wound was closed with 2-0 Vicryl and running 4-0  Monocryl.   The knee was cleaned, dried, dressed sterilely using Dermabond and   Aquacel dressing.  The patient was then   brought to recovery room in stable condition, tolerating the procedure   well.   Please note that Physician Assistant, Danae Orleans, PA-C, was present for the entirety of the case, and was utilized for pre-operative positioning, peri-operative retractor management, general facilitation of the procedure.  He was also utilized for primary wound closure at the end of the case.              Pietro Cassis Alvan Dame, M.D.    08/21/2015 6:31 PM

## 2015-08-21 NOTE — Transfer of Care (Signed)
Immediate Anesthesia Transfer of Care Note  Patient: Albert Hicks  Procedure(s) Performed: Procedure(s): LEFT TOTAL KNEE ARTHROPLASTY (Left)  Patient Location: PACU  Anesthesia Type:MAC and Spinal  Level of Consciousness: awake, alert  and patient cooperative  Airway & Oxygen Therapy: Patient Spontanous Breathing and Patient connected to face mask oxygen  Post-op Assessment: Report given to RN and Post -op Vital signs reviewed and stable  Post vital signs: Reviewed and stable  Last Vitals:  Filed Vitals:   08/21/15 1343  BP: 132/85  Pulse: 84  Temp: 36.9 C  Resp: 18    Last Pain: There were no vitals filed for this visit.       Complications: No apparent anesthesia complications

## 2015-08-21 NOTE — Interval H&P Note (Signed)
History and Physical Interval Note:  08/21/2015 2:22 PM  Albert Hicks  has presented today for surgery, with the diagnosis of LEFT KNEE OA  The various methods of treatment have been discussed with the patient and family. After consideration of risks, benefits and other options for treatment, the patient has consented to  Procedure(s): LEFT TOTAL KNEE ARTHROPLASTY (Left) as a surgical intervention .  The patient's history has been reviewed, patient examined, no change in status, stable for surgery.  I have reviewed the patient's chart and labs.  Questions were answered to the patient's satisfaction.     Mauri Pole

## 2015-08-22 ENCOUNTER — Encounter (HOSPITAL_COMMUNITY): Payer: Self-pay | Admitting: Orthopedic Surgery

## 2015-08-22 DIAGNOSIS — E663 Overweight: Secondary | ICD-10-CM | POA: Diagnosis present

## 2015-08-22 LAB — BASIC METABOLIC PANEL
Anion gap: 9 (ref 5–15)
BUN: 13 mg/dL (ref 6–20)
CO2: 23 mmol/L (ref 22–32)
Calcium: 8.9 mg/dL (ref 8.9–10.3)
Chloride: 105 mmol/L (ref 101–111)
Creatinine, Ser: 1.2 mg/dL (ref 0.61–1.24)
GFR calc Af Amer: >60 mL/min (ref >60)
GFR calc non Af Amer: 58 mL/min — ABNORMAL LOW (ref >60)
Glucose, Bld: 193 mg/dL — ABNORMAL HIGH (ref 65–99)
Potassium: 4.9 mmol/L (ref 3.5–5.1)
Sodium: 137 mmol/L (ref 135–145)

## 2015-08-22 LAB — CBC
HCT: 39 % (ref 39.0–52.0)
Hemoglobin: 13.2 g/dL (ref 13.0–17.0)
MCH: 33.8 pg (ref 26.0–34.0)
MCHC: 33.8 g/dL (ref 30.0–36.0)
MCV: 100 fL (ref 78.0–100.0)
Platelets: 303 10*3/uL (ref 150–400)
RBC: 3.9 MIL/uL — ABNORMAL LOW (ref 4.22–5.81)
RDW: 13 % (ref 11.5–15.5)
WBC: 10.8 10*3/uL — ABNORMAL HIGH (ref 4.0–10.5)

## 2015-08-22 MED ORDER — TIZANIDINE HCL 4 MG PO TABS: 4.0000 mg | ORAL_TABLET | Freq: Four times a day (QID) | ORAL | Status: DC | PRN

## 2015-08-22 MED ORDER — TRAMADOL HCL 50 MG PO TABS: 50.0000 mg | ORAL_TABLET | Freq: Four times a day (QID) | ORAL | Status: DC

## 2015-08-22 MED ORDER — POLYETHYLENE GLYCOL 3350 17 G PO PACK: 17.0000 g | PACK | Freq: Two times a day (BID) | ORAL | Status: DC

## 2015-08-22 MED ORDER — ASPIRIN 325 MG PO TBEC: 325.0000 mg | DELAYED_RELEASE_TABLET | Freq: Two times a day (BID) | ORAL | Status: AC

## 2015-08-22 MED ORDER — CELECOXIB 200 MG PO CAPS: 200.0000 mg | ORAL_CAPSULE | Freq: Two times a day (BID) | ORAL | Status: DC

## 2015-08-22 MED ORDER — FERROUS SULFATE 325 (65 FE) MG PO TABS: 325.0000 mg | ORAL_TABLET | Freq: Three times a day (TID) | ORAL | Status: DC

## 2015-08-22 MED ORDER — DOCUSATE SODIUM 100 MG PO CAPS: 100.0000 mg | ORAL_CAPSULE | Freq: Two times a day (BID) | ORAL | Status: DC

## 2015-08-22 NOTE — Evaluation (Signed)
Physical Therapy Evaluation Patient Details Name: Albert Hicks MRN: [id number] DOB: 10/11/1940 Today's Date: 08/22/2015   History of Present Illness  Pt is a 75 year old male s/p L TKA  Clinical Impression  Pt is s/p L TKA resulting in the deficits listed below (see PT Problem List).  Pt will benefit from skilled PT to increase their independence and safety with mobility to allow discharge to the venue listed below.  Pt ambulated in hallway and performed LE exercises.  Will return this afternoon to practice stairs and then pt anticipates d/c home later today.     Follow Up Recommendations Outpatient PT    Equipment Recommendations  Rolling walker with 5" wheels    Recommendations for Other Services       Precautions / Restrictions Precautions Precautions: Fall;Knee Restrictions Other Position/Activity Restrictions: WBAT      Mobility  Bed Mobility Overal bed mobility: Modified Independent             General bed mobility comments: pt up in recliner on arrival  Transfers Overall transfer level: Needs assistance Equipment used: Rolling walker (2 wheeled) Transfers: Sit to/from Stand Sit to Stand: Min guard Stand pivot transfers: Min guard       General transfer comment: verbal cues for UE and LE positioning  Ambulation/Gait Ambulation/Gait assistance: Min guard Ambulation Distance (Feet): 80 Feet Assistive device: Rolling walker (2 wheeled) Gait Pattern/deviations: Decreased dorsiflexion - left;Step-through pattern;Antalgic     General Gait Details: verbal cues for sequence, RW positoning, step length, keeps L LE stiff  Stairs            Wheelchair Mobility    Modified Rankin (Stroke Patients Only)       Balance                                             Pertinent Vitals/Pain Pain Assessment: 0-10 Pain Score: 2  Pain Location: L knee Pain Descriptors / Indicators: Aching;Sore Pain Intervention(s): Limited activity  within patient's tolerance;Monitored during session;Repositioned;Ice applied    Home Living Family/patient expects to be discharged to:: Private residence Living Arrangements: Spouse/significant other Available Help at Discharge: Friend(s) Type of Home: House Home Access: Stairs to enter Entrance Stairs-Rails: None Entrance Stairs-Number of Steps: 2 Home Layout: Two level;Bed/bath upstairs Home Equipment: None      Prior Function Level of Independence: Independent               Hand Dominance        Extremity/Trunk Assessment   Upper Extremity Assessment: Overall WFL for tasks assessed           Lower Extremity Assessment: LLE deficits/detail   LLE Deficits / Details: L knee AAROM flexion 75* sitting edge of chair, able to perform SLR     Communication   Communication: No difficulties  Cognition Arousal/Alertness: Awake/alert Behavior During Therapy: WFL for tasks assessed/performed Overall Cognitive Status: Within Functional Limits for tasks assessed                      General Comments      Exercises Total Joint Exercises Quad Sets: AROM;Left;10 reps Short Arc Quad: AROM;Left;10 reps Heel Slides: AAROM;Seated;Left;10 reps Hip ABduction/ADduction: AROM;Left;10 reps Straight Leg Raises: Left;10 reps;AROM      Assessment/Plan    PT Assessment Patient needs continued PT services  PT Diagnosis Difficulty walking;Acute  pain   PT Problem List Decreased strength;Decreased range of motion;Decreased mobility;Pain;Decreased knowledge of precautions;Decreased knowledge of use of DME  PT Treatment Interventions Functional mobility training;Gait training;DME instruction;Stair training;Patient/family education;Therapeutic activities;Therapeutic exercise   PT Goals (Current goals can be found in the Care Plan section) Acute Rehab PT Goals Patient Stated Goal: get home PT Goal Formulation: With patient Time For Goal Achievement: 08/25/15 Potential to  Achieve Goals: Good    Frequency 7X/week   Barriers to discharge        Co-evaluation               End of Session   Activity Tolerance: Patient tolerated treatment well Patient left: in chair;with call bell/phone within reach           Time: 0955-1009 PT Time Calculation (min) (ACUTE ONLY): 14 min   Charges:   PT Evaluation $PT Eval Low Complexity: 1 Procedure     PT G Codes:        Hamad Whyte,KATHrine E 08/22/2015, 12:09 PM Carmelia Bake, PT, DPT 08/22/2015 Pager: [phone]

## 2015-08-22 NOTE — Evaluation (Signed)
  Occupational Therapy Evaluation Patient Details Name: Albert Hicks MRN: [id number] DOB: [birth year] Today's Date: September 19, 2015    History of Present Illness LTKR   Clinical Impression   OT education complete s/p TKR    Follow Up Recommendations  No OT follow up    Equipment Recommendations  3 in 1 bedside comode       Precautions / Restrictions Restrictions Weight Bearing Restrictions: No      Mobility Bed Mobility Overal bed mobility: Modified Independent                Transfers Overall transfer level: Needs assistance Equipment used: Rolling walker (2 wheeled) Transfers: Sit to/from [facility] Sit to Stand: Min guard Stand pivot transfers: Min guard       General transfer comment: VC for hand placement         ADL Overall ADL's : Needs assistance/impaired     Grooming: Set up;Sitting   Upper Body Bathing: Set up;Sitting   Lower Body Bathing: Minimal assistance;Sit to/from stand;Cueing for safety   Upper Body Dressing : Set up;Sitting   Lower Body Dressing: Minimal assistance;Sit to/from stand;Cueing for sequencing;Cueing for safety   Toilet Transfer: Min guard;RW;Comfort height toilet   Toileting- Clothing Manipulation and Hygiene: Min guard;Sit to/from Nurse, children's Details (indicate cue type and reason): verbalized safety Functional mobility during ADLs: Min guard General ADL Comments: educated pt on walker safety with ADL activity               Pertinent Vitals/Pain Pain Assessment: 0-10 Pain Score: 1  Pain Location: L knee Pain Intervention(s): Repositioned     Hand Dominance     Extremity/Trunk Assessment Upper Extremity Assessment Upper Extremity Assessment: Overall WFL for tasks assessed           Communication Communication Communication: No difficulties   Cognition Arousal/Alertness: Awake/alert Behavior During Therapy: WFL for tasks assessed/performed Overall Cognitive Status:  Within Functional Limits for tasks assessed                                Home Living Family/patient expects to be discharged to:: Private residence Living Arrangements: Spouse/significant other Available Help at Discharge: Friend(s) Type of Home: House Home Access: Stairs to enter Technical brewer of Steps: 2 Entrance Stairs-Rails: None Home Layout: Two level;Bed/bath upstairs     Bathroom Shower/Tub: Teacher, early years/pre: Standard     Home Equipment: None          Prior Functioning/Environment Level of Independence: Independent                      OT Goals(Current goals can be found in the care plan section) Acute Rehab OT Goals Patient Stated Goal: get home OT Goal Formulation: With patient Potential to Achieve Goals: Good  OT Frequency:     Barriers to D/C:               End of Session Equipment Utilized During Treatment: Rolling walker Nurse Communication: Mobility status  Activity Tolerance: Patient tolerated treatment well Patient left: in chair   Time: 0925-0950 OT Time Calculation (min): 25 min Charges:  OT General Charges $OT Visit: 1 Procedure OT Evaluation $OT Eval Low Complexity: 1 Procedure OT Treatments $Self Care/Home Management : 8-22 mins G-Codes:    Payton Mccallum D 2015-09-19, 10:35 AM

## 2015-08-22 NOTE — Discharge Instructions (Signed)

## 2015-08-22 NOTE — Progress Notes (Signed)
Patient requesting RW and 3n1, contacted [facility] to deliver to room prior to d/c today.

## 2015-08-22 NOTE — Care Management Note (Addendum)
Case Management Note  Patient Details  Name: Eh Waver MRN: [id number] Date of Birth: [birth year]  Subjective/Objective:    S/p Left total knee replacement.                 Action/Plan: Discharge planning, no HH needs identified, plan for OP therapy  Expected Discharge Date:  08/23/15               Expected Discharge Plan:  [place]  In-House Referral:  NA  Discharge planning Services  CM Consult  Post Acute Care Choice:  NA Choice offered to:  NA  HH Arranged:  NA HH Agency:  NA  Status of Service:  Completed, signed off  Medicare Important Message Given:    Date Medicare IM Given:    Medicare IM give by:    Date Additional Medicare IM Given:    Additional Medicare Important Message give by:     If discussed at [place] of Stay Meetings, dates discussed:    Additional Comments:  Guadalupe Maple, RN 08/22/2015, 10:39 AM [phone]

## 2015-08-22 NOTE — Progress Notes (Signed)
     Subjective: 1 Day Post-Op Procedure(s) (LRB): LEFT TOTAL KNEE ARTHROPLASTY (Left)   Patient reports pain as mild, pain controlled. No events throughout the night.  Feels that he is doing quite well.  Ready to be discharged home.  Objective:   VITALS:   Filed Vitals:   08/22/15 0218 08/22/15 0549  BP: 130/74 130/80  Pulse: 90 71  Temp: 97.8 F (36.6 C) 97.5 F (36.4 C)  Resp: 16 16    Dorsiflexion/Plantar flexion intact Incision: dressing C/D/I No cellulitis present Compartment soft  LABS  Recent Labs  08/22/15 0419  HGB 13.2  HCT 39.0  WBC 10.8*  PLT 303     Recent Labs  08/22/15 0419  NA 137  K 4.9  BUN 13  CREATININE 1.20  GLUCOSE 193*     Assessment/Plan: 1 Day Post-Op Procedure(s) (LRB): LEFT TOTAL KNEE ARTHROPLASTY (Left) Foley cath d/c'ed Advance diet Up with therapy D/C IV fluids Discharge home with home health  Follow up in 2 weeks at [facility]. Follow up with OLIN,Zehava Turski D in 2 weeks.  Contact information:  [facility] [address], [place] [id number]    Overweight (BMI 25-29.9) Estimated body mass index is 28.37 kg/(m^2) as calculated from the following:   Height as of this encounter: 6\' 1"  (1.854 m).   Weight as of this encounter: 97.523 kg (215 lb). Patient also counseled that weight may inhibit the healing process Patient counseled that losing weight will help with future health issues         West Pugh. Ilham Roughton   PAC  08/22/2015, 8:59 AM

## 2015-08-22 NOTE — Progress Notes (Signed)
Physical Therapy Treatment Notes    08/22/15 1400  PT Visit Information  Last PT Received On 08/22/15  Assistance Needed +1  History of Present Illness Pt is a 75 year old male s/p L TKA  Subjective Data  Subjective Pt ambulated to stairway and then practiced 10 steps with spouse present.  Pt's recliner brought for pt to ride back, pt also with nausea and emesis upon return to recliner.  Pt mobilizes well and RN notified of emesis.  Precautions  Precautions Fall;Knee  Restrictions  Other Position/Activity Restrictions WBAT  Pain Assessment  Pain Assessment 0-10  Pain Score 1  Pain Location L knee  Pain Descriptors / Indicators Aching;Sore  Pain Intervention(s) Limited activity within patient's tolerance;Monitored during session;Repositioned;Ice applied  Cognition  Arousal/Alertness Awake/alert  Behavior During Therapy WFL for tasks assessed/performed  Overall Cognitive Status Within Functional Limits for tasks assessed  Transfers  Overall transfer level Needs assistance  Equipment used Rolling walker (2 wheeled)  Transfers Sit to/from Stand  Sit to Stand Supervision  General transfer comment verbal cues for UE and LE positioning  Ambulation/Gait  Ambulation/Gait assistance Supervision  Ambulation Distance (Feet) 80 Feet  Assistive device Rolling walker (2 wheeled)  Gait Pattern/deviations Step-through pattern;Decreased stance time - left;Antalgic  General Gait Details verbal cues for RW positoning and posture  Stairs Yes  Stairs assistance Min guard  Stair Management Two rails;Step to pattern;Forwards  Number of Stairs 10  General stair comments verbal cues for sequence and safety, spouse present and educated as well  PT - End of Session  Activity Tolerance Patient tolerated treatment well  Patient left in chair;with call bell/phone within reach;with nursing/sitter in room;with family/visitor present  PT - Assessment/Plan  PT Plan Current plan remains appropriate  PT  Frequency (ACUTE ONLY) 7X/week  Follow Up Recommendations Outpatient PT  PT equipment Rolling walker with 5" wheels  PT Goal Progression  Progress towards PT goals Progressing toward goals  PT Time Calculation  PT Start Time (ACUTE ONLY) 1308  PT Stop Time (ACUTE ONLY) 1326  PT Time Calculation (min) (ACUTE ONLY) 18 min  PT General Charges  $$ ACUTE PT VISIT 1 Procedure  PT Treatments  $Gait Training 8-22 mins   Carmelia Bake, PT, DPT 08/22/2015 Pager: [phone]

## 2015-08-24 ENCOUNTER — Encounter: Payer: Self-pay | Admitting: Rehabilitative and Restorative Service Providers"

## 2015-08-24 ENCOUNTER — Ambulatory Visit (INDEPENDENT_AMBULATORY_CARE_PROVIDER_SITE_OTHER): Payer: Medicare Other | Admitting: Rehabilitative and Restorative Service Providers"

## 2015-08-24 DIAGNOSIS — R29898 Other symptoms and signs involving the musculoskeletal system: Secondary | ICD-10-CM

## 2015-08-24 DIAGNOSIS — R2689 Other abnormalities of gait and mobility: Secondary | ICD-10-CM | POA: Diagnosis not present

## 2015-08-24 DIAGNOSIS — R531 Weakness: Secondary | ICD-10-CM

## 2015-08-24 DIAGNOSIS — M25562 Pain in left knee: Secondary | ICD-10-CM | POA: Diagnosis present

## 2015-08-24 NOTE — Therapy (Signed)
[facility] [place], [place], [address] Phone: [phone]   Fax:  [phone]  Physical Therapy Evaluation  Patient Details  Name: Albert Hicks MRN: [id number] Date of Birth: [birth year] Referring Provider: Dr. Adriana Mccallum  Encounter Date: 08/24/2015      PT End of Session - 08/24/15 1647    Visit Number 1   Number of Visits 24   Date for PT Re-Evaluation 11/16/15   PT Start Time 1340   PT Stop Time 1442   PT Time Calculation (min) 62 min   Activity Tolerance Patient tolerated treatment well      Past Medical History  Diagnosis Date  . Hyperlipidemia   . Hypertension   . Gross hematuria   . BPH (benign prostatic hypertrophy)   . Elevated PSA   . OA (osteoarthritis) knees  . Hydronephrosis, left   . Acid reflux OCCASIONALLY TAKE TUMS  . Complication of anesthesia   . PONV (postoperative nausea and vomiting)   . Wears glasses   . Tinnitus     Past Surgical History  Procedure Laterality Date  . Total hip arthroplasty  07-09-2005  DR ALUISIO    RIGHT HIP OA  . Left knee surgery  1993  (APPROX)  . Appendectomy  AGE 41  . Cataract extraction w/ intraocular lens implant  2013    RIGHT EYE  . Cystoscopy  02/13/2012    Procedure: CYSTOSCOPY FLEXIBLE;  Surgeon: Hanley Ben, MD;  [facility];  Service: Urology;  Laterality: N/A;  . Prostatectomy N/A 08/03/2012    Procedure: SIMPLE RETROPUBIC PROSTATECTOMY, removal of left double J stent. ;  Surgeon: Hanley Ben, MD;  Location: [facility];  Service: Urology;  Laterality: N/A;  . Total knee arthroplasty Left 08/21/2015    Procedure: LEFT TOTAL KNEE ARTHROPLASTY;  Surgeon: Paralee Cancel, MD;  Location: [facility];  Service: Orthopedics;  Laterality: Left;    There were no vitals filed for this visit.       Subjective Assessment - 08/24/15 1344    Subjective Patient reporta about a 2 year history of Lt knee pain. Elected to  undergo Lt TKA 08/21/15. He has done well following surgery without complications.   Pertinent History Denies any musculoskeletal problems - arthritis Rt knee; THA Rt ~ 10 yrs ago   How long can you sit comfortably? 2+ hours    How long can you stand comfortably? 5 min    How long can you walk comfortably? 5 min    Diagnostic tests xrays    Patient Stated Goals get back on the golf course - knee to move well and be strong    Currently in Pain? No/denies   Pain Location Knee   Pain Orientation Left   Pain Descriptors / Indicators Aching   Pain Type Surgical pain   Pain Frequency Intermittent            [facility] PT Assessment - 08/24/15 0001    Assessment   Medical Diagnosis Lt TKA   Referring Provider Dr. Adriana Mccallum   Onset Date/Surgical Date 08/21/15   Hand Dominance Right   Next MD Visit 09/05/15   Prior Therapy in the hospital    Precautions   Precautions --  TKA   Restrictions   Weight Bearing Restrictions No  as tolerated    Balance Screen   Has the patient fallen in the past 6 months No   Has the patient had a decrease in activity  level because of a fear of falling?  No   Is the patient reluctant to leave their home because of a fear of falling?  No   Home Environment   Additional Comments multilevel home - sleeps on 2nd floor - OK with stairs    Prior Function   Level of Independence Independent   Vocation Retired   Designer, industrial/product - retired ~10 yrs ago    Leisure golf weekly in season; work out in the [employer] room 3 days/wk 60-120 min cardio - bike 10 min and weights - free weights and machines    Observation/Other Assessments   Focus on Therapeutic Outcomes (FOTO)  66% limitation   Observation/Other Assessments-Edema    Edema --  moderate edema Lt knee - post op dressing in place    Sensation   Additional Comments WFL's per pt report    Posture/Postural Control   Posture Comments flexed forward at hips in standing and with gait    AROM    Overall AROM Comments bilat hips and ankles WFL's - some tightness in the Rt hip compared to Lt (THA - Rt 10 yr ago) tight hip flexors bilat (-) 10-15 degrees    Right Knee Extension -2   Right Knee Flexion 118   Left Knee Extension -9   Left Knee Flexion 60   Strength   Overall Strength Comments Rt LE 5-/5 except hip ext 4-/5; Lt hip flex,abd,add 4-/5; hip ext 3+/5; Lt knee flex/ext 3-/5    Flexibility   Hamstrings Rt 70 deg; Lt 62 deg    Quadriceps Rt 90 deg Lt    ITB tight bilat    Functional Gait  Assessment   Gait assessed  --  ambulates with step to gait, decreased wt bearing Lt LE                   [facility] Adult PT Treatment/Exercise - 08/24/15 0001    Self-Care   Self-Care --  gait training with rolling walker -step through/posture work   Knee/Hip Exercises: Hydrologist 3 reps;30 seconds   Quad Stretch 2 reps;30 seconds   Knee/Hip Exercises: Standing   Other Standing Knee Exercises standing at wall working on hip and trunk and knee extension    Knee/Hip Exercises: Supine   Quad Sets Left;5 reps  10 sec hold PT hand under knee for TC   Straight Leg Raises Left;5 reps  quad set before lift    Knee/Hip Exercises: Prone   Hip Extension Right;Left;1 set;10 reps   Hip Extension Limitations toe resting on table - pt extending the knee    Other Prone Exercises glut set 10 sec x 10    Electrical Stimulation   Electrical Stimulation Location Lt knee    Electrical Stimulation Action ion repelling    Electrical Stimulation Parameters to tolerance   Electrical Stimulation Goals Pain;Edema   Vasopneumatic   Number Minutes Vasopneumatic  15 minutes   Vasopnuematic Location  Knee  Lt   Vasopneumatic Pressure Low   Vasopneumatic Temperature  3*                PT Education - 08/24/15 1437    Education provided Yes   Education Details HEP TENS info   Person(s) Educated Patient   Methods Explanation;Demonstration;Tactile cues;Verbal  cues;Handout   Comprehension Verbalized understanding;Returned demonstration;Verbal cues required;Tactile cues required          PT Short Term Goals - 08/24/15 1650  PT SHORT TERM GOAL #1   Title Instruct patient in appropriate gait pattern with rolling walker and step through gait pattern with equal step length 09/22/15   Time 4   Period Weeks   Status New   PT SHORT TERM GOAL #2   Title Patient to transfer in and out of chair; bed; car indepently with good knee flexion 09/22/15    Time 4   Period Weeks   Status New   PT SHORT TERM GOAL #3   Title Lt knee flexion to 90 degrees; extension (-) 2 degrees 10/05/15   Time 6   Period Weeks   Status New   PT SHORT TERM GOAL #4   Title Independent in initial HEP 10/05/15   Time 6   Period Weeks           PT Long Term Goals - 08/24/15 1653    PT LONG TERM GOAL #1   Title 4+/5 to 5/5 strength Lt LE 11/15/15   Time 12   Period Weeks   Status New   PT LONG TERM GOAL #2   Title Lt knee flesion to 120 degrees extension 0 degrees 11/15/15   Time 12   Period Weeks   Status New   PT LONG TERM GOAL #3   Title Independent in HEP for discharge wit hreturn to golf 11/15/15   Time 12   Period Weeks   Status New   PT LONG TERM GOAL #4   Title Independent in gait with least restrictive assistive device to no assistive device 11/15/15   Time 12   Period Weeks   Status New   PT LONG TERM GOAL #5   Title Improve FOTO to </= 43% limitation 11/15/15   Time 12   Period Weeks   Status New               Plan - 08/24/15 1647    Clinical Impression Statement Patient presents s/p Lt TKA 08/21/15. He has decreased ROM, strength, functional abilities Lt LE; abnormal posture and alignment; abnormal, dependent gait; pain and limited ADL's.    Rehab Potential Good   PT Frequency 2x / week   PT Duration 12 weeks   PT Treatment/Interventions Patient/family education;ADLs/Self Care Home Management;Cryotherapy;Electrical  Stimulation;Iontophoresis 4mg /ml Dexamethasone;Moist Heat;Ultrasound;Neuromuscular re-education;Gait training;Manual techniques;Dry needling;Therapeutic activities;Therapeutic exercise   PT Next Visit Plan progress with ROM/strengthening as indicated; gait training; modalities as indicated    Consulted and Agree with Plan of Care Patient      Patient will benefit from skilled therapeutic intervention in order to improve the following deficits and impairments:  Postural dysfunction, Improper body mechanics, Pain, Decreased range of motion, Decreased mobility, Decreased strength, Abnormal gait, Decreased activity tolerance  Visit Diagnosis: Pain in left knee - Plan: PT plan of care cert/re-cert  Weakness generalized - Plan: PT plan of care cert/re-cert  Other symptoms and signs involving the musculoskeletal system - Plan: PT plan of care cert/re-cert  Other abnormalities of gait and mobility - Plan: PT plan of care cert/re-cert     Problem List Patient Active Problem List   Diagnosis Date Noted  . Overweight (BMI 25.0-29.9) 08/22/2015  . S/P left TKA 08/21/2015  . Primary osteoarthritis of left knee 07/10/2015  . Prediabetes 06/22/2015  . Elevated fasting glucose 06/08/2015  . Primary osteoarthritis of both knees 01/18/2015  . Abnormal ECG 06/14/2014  . Nonspecific abnormal electrocardiogram (ECG) (EKG) 05/02/2014  . Colon polyp 04/17/2014  . Osteoarthritis of both shoulders 07/15/2013  .  Osteoarthritis of both knees 12/01/2012  . BPH (benign prostatic hypertrophy) with urinary obstruction 07/03/2012  . History of gout 06/14/2012  . Diarrhea 05/11/2012  . Pyelonephritis 05/09/2012  . Hypokalemia 05/09/2012  . Sepsis due to urinary tract infection ([place]) 05/09/2012  . Hyponatremia 05/09/2012  . Leukocytosis, unspecified 05/09/2012  . CKD (chronic kidney disease) stage 3, GFR 30-59 ml/min 03/23/2012  . Obstruction of kidney 02/23/2012  . Hyperlipidemia   . Hypertension      Leonid Manus Nilda Simmer PT, MPH  08/24/2015, 5:00 PM  [facility] [place], [place], [address] Phone: [phone]   Fax:  [phone]  Name: Albert Hicks MRN: [id number] Date of Birth: 12-Nov-1940

## 2015-08-24 NOTE — Patient Instructions (Signed)
HIP: Hamstrings - Supine   Place strap around foot. Raise leg up, keeping knee straight.  Bend opposite knee to protect back if indicated. Hold 30 seconds. 3 reps per set, 2-3 sets per day   Quads / HF, Prone   Lie face down.  Use towel or strap to pull heel up toward buttock. Pull foot toward buttock.  Hold 30 seconds. Repeat 3 times per session. Do 2-3 sessions per day.  Gluteal Sets   Can do on stomach!  Squeeze pelvic floor and hold. Tighten bottom. Hold for _10__ seconds. Relax for __1-2_ seconds. Repeat _10__ times.    Quad Sets    Slowly tighten thigh muscles of straight, left leg while counting out loud to _10 sec ___. Relax. Repeat _10__ times. Do __2-3__ sessions per day.   HIP: Flexion / KNEE: Extension, Straight Leg Raise    Tighten quad. Raise leg, keeping knee straight with heel 10-12 inches off bed.  Hold 5 sec. Perform slowly. _10__ reps per set, _1-2__ sets per day  Work on bending knee in sitting  Work on standing straight and tall  Try doing this during commercials on TV for a 30 min program  TENS UNIT: This is helpful for muscle pain and spasm.   Search and Purchase a TENS 7000 2nd edition at www.tenspros.com. It should be less than $30.     TENS unit instructions: Do not shower or bathe with the unit on Turn the unit off before removing electrodes or batteries If the electrodes lose stickiness add a drop of water to the electrodes after they are disconnected from the unit and place on plastic sheet. If you continued to have difficulty, call the TENS unit company to purchase more electrodes. Do not apply lotion on the skin area prior to use. Make sure the skin is clean and dry as this will help prolong the life of the electrodes. After use, always check skin for unusual red areas, rash or other skin difficulties. If there are any skin problems, does not apply electrodes to the same area. Never remove the electrodes from the unit by pulling the  wires. Do not use the TENS unit or electrodes other than as directed. Do not change electrode placement without consultating your therapist or physician. Keep 2 fingers with between each electrode.

## 2015-08-26 ENCOUNTER — Telehealth: Payer: Self-pay | Admitting: Family Medicine

## 2015-08-26 MED ORDER — COLCHICINE 0.6 MG PO TABS: 0.6000 mg | ORAL_TABLET | Freq: Every day | ORAL | Status: DC

## 2015-08-26 NOTE — Telephone Encounter (Signed)
Left foot pain.   Patient called the after hours phone line. He is 5 day s/p left TKR. Today he developed left foot pain and swelling. He rates the pain 10/10 and notes that it is consistent with prior gout flairs. He would like a Rx for colchicine.  I advised that he needs to be seen in our office Monday but I will send in a limited supply of colchicine.  No fever, or chills. The knee is not especially bothersome.

## 2015-08-27 ENCOUNTER — Ambulatory Visit (INDEPENDENT_AMBULATORY_CARE_PROVIDER_SITE_OTHER): Payer: Medicare Other | Admitting: Physical Therapy

## 2015-08-27 DIAGNOSIS — R2689 Other abnormalities of gait and mobility: Secondary | ICD-10-CM | POA: Diagnosis not present

## 2015-08-27 DIAGNOSIS — M25562 Pain in left knee: Secondary | ICD-10-CM

## 2015-08-27 DIAGNOSIS — R29898 Other symptoms and signs involving the musculoskeletal system: Secondary | ICD-10-CM | POA: Diagnosis not present

## 2015-08-27 DIAGNOSIS — R531 Weakness: Secondary | ICD-10-CM

## 2015-08-27 NOTE — Therapy (Signed)
[place], [place], [address] Phone: [phone]   Fax:  [phone]  Physical Therapy Treatment  Patient Details  Name: Albert Hicks MRN: [id number] Date of Birth: 05/15/1940 Referring Provider: Dr. Adriana Mccallum  Encounter Date: 08/27/2015      PT End of Session - 08/27/15 1606    Visit Number 2   Number of Visits 24   Date for PT Re-Evaluation 11/16/15   PT Start Time [id number]   PT Stop Time 1618   PT Time Calculation (min) 63 min      Past Medical History  Diagnosis Date  . Hyperlipidemia   . Hypertension   . Gross hematuria   . BPH (benign prostatic hypertrophy)   . Elevated PSA   . OA (osteoarthritis) knees  . Hydronephrosis, left   . Acid reflux OCCASIONALLY TAKE TUMS  . Complication of anesthesia   . PONV (postoperative nausea and vomiting)   . Wears glasses   . Tinnitus     Past Surgical History  Procedure Laterality Date  . Total hip arthroplasty  07-09-2005  DR ALUISIO    RIGHT HIP OA  . Left knee surgery  1993  (APPROX)  . Appendectomy  AGE 25  . Cataract extraction w/ intraocular lens implant  2013    RIGHT EYE  . Cystoscopy  02/13/2012    Procedure: CYSTOSCOPY FLEXIBLE;  Surgeon: Hanley Ben, MD;  Location: [facility];  Service: Urology;  Laterality: N/A;  . Prostatectomy N/A 08/03/2012    Procedure: SIMPLE RETROPUBIC PROSTATECTOMY, removal of left double J stent. ;  Surgeon: Hanley Ben, MD;  Location: [facility];  Service: Urology;  Laterality: N/A;  . Total knee arthroplasty Left 08/21/2015    Procedure: LEFT TOTAL KNEE ARTHROPLASTY;  Surgeon: Paralee Cancel, MD;  Location: [facility];  Service: Orthopedics;  Laterality: Left;    There were no vitals filed for this visit.      Subjective Assessment - 08/27/15 1520    Currently in Pain? Yes   Pain Score 2   up to 5/10 with bending    Pain Location Knee   Pain Orientation Left   Pain Descriptors /  Indicators Aching   Aggravating Factors  bending knee   Pain Relieving Factors ice             [facility] PT Assessment - 08/27/15 0001    AROM   Left Knee Extension -4  with quad set    Left Knee Flexion 80  with seated scoots          [facility] Adult PT Treatment/Exercise - 08/27/15 0001    Ambulation/Gait   Ambulation/Gait Yes   Ambulation Distance (Feet) 200 Feet   Assistive device Rolling walker   Gait Pattern Partial Step-through pattern;Decreased hip/knee flexion - left;Decreased dorsiflexion - left;Antalgic;Trunk flexed   Ambulation Surface Level;Indoor   Gait Comments VC for increased heel strike, increased Lt knee flexion during swing through.    Knee/Hip Exercises: Stretches   Gastroc Stretch Left;1 rep;30 seconds   Knee/Hip Exercises: Aerobic   Nustep For ROM x 6 min    Knee/Hip Exercises: Standing   Other Standing Knee Exercises standing Lt knee flex /ext x 20 reps (with RW) x 2 sets.  Step ups 3" step, x 8 reps with BUE support; repeated x 4 reps on 6" step.  VC to avoid hip hike prior to stepping.    Knee/Hip Exercises: Seated   Other Seated Knee/Hip Exercises  seated scoot with 15 sec hold x 10; quad set with heel prop x 5 sec hold x 10 reps   Sit to [employer] 5 reps   Designer, jewellery Parameters to tolerance    Electrical Stimulation Goals Pain;Edema   Vasopneumatic   Number Minutes Vasopneumatic  15 minutes   Vasopnuematic Location  Knee   Vasopneumatic Pressure Medium   Vasopneumatic Temperature  3 snowflakes            PT Short Term Goals - 08/27/15 1648    PT SHORT TERM GOAL #1   Title Instruct patient in appropriate gait pattern with rolling walker and step through gait pattern with equal step length 09/22/15   Time 4   Period Weeks   Status On-going   PT SHORT TERM GOAL #2   Title Patient to transfer in and out of chair; bed; car  indepently with good knee flexion 09/22/15    Time 4   Period Weeks   Status On-going   PT SHORT TERM GOAL #3   Title Lt knee flexion to 90 degrees; extension (-) 2 degrees 10/05/15   Time 6   Period Weeks   Status On-going   PT SHORT TERM GOAL #4   Title Independent in initial HEP 10/05/15   Time 6   Period Weeks   Status On-going           PT Long Term Goals - 08/27/15 1649    PT LONG TERM GOAL #1   Title 4+/5 to 5/5 strength Lt LE 11/15/15   Time 12   Period Weeks   Status On-going   PT LONG TERM GOAL #2   Title Lt knee flesion to 120 degrees extension 0 degrees 11/15/15   Time 12   Period Weeks   Status On-going   PT LONG TERM GOAL #3   Title Independent in HEP for discharge with return to golf 11/15/15   Time 12   Period Weeks   Status On-going   PT LONG TERM GOAL #4   Title Independent in gait with least restrictive assistive device to no assistive device 11/15/15   Time 12   Period Weeks   Status On-going   PT LONG TERM GOAL #5   Title Improve FOTO to </= 43% limitation 11/15/15   Time 12   Period Weeks   Status On-going               Plan - 08/27/15 1647    Clinical Impression Statement Pt demonstrated improved Lt knee AROM.  Pt tolerated all exercises with mild increase in pain.  Pain reduced with use of vaso and estim.  Progressing towards goals.    Rehab Potential Good   PT Frequency 2x / week   PT Duration 12 weeks   PT Treatment/Interventions Patient/family education;ADLs/Self Care Home Management;Cryotherapy;Electrical Stimulation;Iontophoresis 4mg /ml Dexamethasone;Moist Heat;Ultrasound;Neuromuscular re-education;Gait training;Manual techniques;Dry needling;Therapeutic activities;Therapeutic exercise   PT Next Visit Plan progress with ROM/strengthening as indicated; gait training; modalities as indicated    Consulted and Agree with Plan of Care Patient      Patient will benefit from skilled therapeutic intervention in order to improve the  following deficits and impairments:  Postural dysfunction, Improper body mechanics, Pain, Decreased range of motion, Decreased mobility, Decreased strength, Abnormal gait, Decreased activity tolerance  Visit Diagnosis: Pain in left knee  Weakness generalized  Other symptoms and signs involving  the musculoskeletal system  Other abnormalities of gait and mobility     Problem List Patient Active Problem List   Diagnosis Date Noted  . Overweight (BMI 25.0-29.9) 08/22/2015  . S/P left TKA 08/21/2015  . Primary osteoarthritis of left knee 07/10/2015  . Prediabetes 06/22/2015  . Elevated fasting glucose 06/08/2015  . Primary osteoarthritis of both knees 01/18/2015  . Abnormal ECG 06/14/2014  . Nonspecific abnormal electrocardiogram (ECG) (EKG) 05/02/2014  . Colon polyp 04/17/2014  . Osteoarthritis of both shoulders 07/15/2013  . Osteoarthritis of both knees 12/01/2012  . BPH (benign prostatic hypertrophy) with urinary obstruction 07/03/2012  . History of gout 06/14/2012  . Diarrhea 05/11/2012  . Pyelonephritis 05/09/2012  . Hypokalemia 05/09/2012  . Sepsis due to urinary tract infection ([place]) 05/09/2012  . Hyponatremia 05/09/2012  . Leukocytosis, unspecified 05/09/2012  . CKD (chronic kidney disease) stage 3, GFR 30-59 ml/min 03/23/2012  . Obstruction of kidney 02/23/2012  . Hyperlipidemia   . Hypertension    Kerin Perna, PTA 08/27/2015 4:50 PM  [place], [place], [address] Phone: [phone]   Fax:  [phone]  Name: Albert Hicks MRN: [id number] Date of Birth: Sep 28, 1940

## 2015-08-27 NOTE — Discharge Summary (Signed)
Physician Discharge Summary  Patient ID: Albert Hicks MRN: [id number] DOB/AGE: 1940/08/03 75 y.o.  Admit date: 08/21/2015 Discharge date: 08/22/2015   Procedures:  Procedure(s) (LRB): LEFT TOTAL KNEE ARTHROPLASTY (Left)  Attending Physician:  Dr. Paralee Cancel   Admission Diagnoses:   Left knee primary OA /pain  Discharge Diagnoses:  Principal Problem:   S/P left TKA Active Problems:   Overweight (BMI 25.0-29.9)  Past Medical History  Diagnosis Date  . Hyperlipidemia   . Hypertension   . Gross hematuria   . BPH (benign prostatic hypertrophy)   . Elevated PSA   . OA (osteoarthritis) knees  . Hydronephrosis, left   . Acid reflux OCCASIONALLY TAKE TUMS  . Complication of anesthesia   . PONV (postoperative nausea and vomiting)   . Wears glasses   . Tinnitus     HPI:    Albert Hicks, 75 y.o. male, has a history of pain and functional disability in the left knee due to arthritis and has failed non-surgical conservative treatments for greater than 12 weeks to include NSAID's and/or analgesics, corticosteriod injections, viscosupplementation injections, use of assistive devices and activity modification. Onset of symptoms was gradual, starting >10 years ago with gradually worsening course since that time. The patient noted prior procedures on the knee to include arthroscopy and menisectomy on the left knee(s). Patient currently rates pain in the left knee(s) at 7 out of 10 with activity. Patient has worsening of pain with activity and weight bearing, pain that interferes with activities of daily living, pain with passive range of motion, crepitus and joint swelling. Patient has evidence of periarticular osteophytes and joint space narrowing by imaging studies.There is no active infection. Risks, benefits and expectations were discussed with the patient. Risks including but not limited to the risk of anesthesia, blood clots, nerve damage, blood vessel damage, failure of the  prosthesis, infection and up to and including death. Patient understand the risks, benefits and expectations and wishes to proceed with surgery.   PCP: Iran Planas, PA-C   Discharged Condition: good  Hospital Course:  Patient underwent the above stated procedure on 08/21/2015. Patient tolerated the procedure well and brought to the recovery room in good condition and subsequently to the floor.  POD #1 BP: 130/80 ; Pulse: 71 ; Temp: 97.5 F (36.4 C) ; Resp: 16 Patient reports pain as mild, pain controlled. No events throughout the night. Feels that he is doing quite well. Ready to be discharged home. Dorsiflexion/plantar flexion intact, incision: dressing C/D/I, no cellulitis present and compartment soft.   LABS  Basename    HGB     13.2  HCT     39.0    Discharge Exam: General appearance: alert, cooperative and no distress Extremities: Homans sign is negative, no sign of DVT, no edema, redness or tenderness in the calves or thighs and no ulcers, gangrene or trophic changes  Disposition: Home with follow up in 2 weeks   Follow-up Information    Follow up with Mauri Pole, MD. Schedule an appointment as soon as possible for a visit in 2 weeks.   Specialty:  Orthopedic Surgery   Contact information:   [address] [place] [address] [id number]       Discharge Instructions    Call MD / Call 911    Complete by:  As directed   If you experience chest pain or shortness of breath, CALL 911 and be transported to the hospital emergency room.  If you develope a fever  above 101 F, pus (white drainage) or increased drainage or redness at the wound, or calf pain, call your surgeon's office.     Change dressing    Complete by:  As directed   Maintain surgical dressing until follow up in the clinic. If the edges start to pull up, may reinforce with tape. If the dressing is no longer working, may remove and cover with gauze and tape, but must keep the area dry  and clean.  Call with any questions or concerns.     Constipation Prevention    Complete by:  As directed   Drink plenty of fluids.  Prune juice may be helpful.  You may use a stool softener, such as Colace (over the counter) 100 mg twice a day.  Use MiraLax (over the counter) for constipation as needed.     Diet - low sodium heart healthy    Complete by:  As directed      Discharge instructions    Complete by:  As directed   Maintain surgical dressing until follow up in the clinic. If the edges start to pull up, may reinforce with tape. If the dressing is no longer working, may remove and cover with gauze and tape, but must keep the area dry and clean.  Follow up in 2 weeks at [facility]. Call with any questions or concerns.     Increase activity slowly as tolerated    Complete by:  As directed   Weight bearing as tolerated with assist device (walker, cane, etc) as directed, use it as long as suggested by your surgeon or therapist, typically at least 4-6 weeks.     TED hose    Complete by:  As directed   Use stockings (TED hose) for 2 weeks on both leg(s).  You may remove them at night for sleeping.             Medication List    TAKE these medications        acetaminophen 500 MG tablet  Commonly known as:  TYLENOL  Take 1,000 mg by mouth every 6 (six) hours as needed. For pain     amLODipine 10 MG tablet  Commonly known as:  NORVASC  Take 1 tablet (10 mg total) by mouth daily.     aspirin 325 MG EC tablet  Take 1 tablet (325 mg total) by mouth 2 (two) times daily.     celecoxib 200 MG capsule  Commonly known as:  CELEBREX  Take 1 capsule (200 mg total) by mouth every 12 (twelve) hours.     docusate sodium 100 MG capsule  Commonly known as:  COLACE  Take 1 capsule (100 mg total) by mouth 2 (two) times daily.     ferrous sulfate 325 (65 FE) MG tablet  Take 1 tablet (325 mg total) by mouth 3 (three) times daily after meals.     LIVALO 2 MG Tabs  Generic  drug:  Pitavastatin Calcium  Take 2 mg by mouth daily.     polyethylene glycol packet  Commonly known as:  MIRALAX / GLYCOLAX  Take 17 g by mouth 2 (two) times daily.     SYSTANE OP  Place 1-2 drops into both eyes daily as needed (For dry eyes.).     tiZANidine 4 MG tablet  Commonly known as:  ZANAFLEX  Take 1 tablet (4 mg total) by mouth every 6 (six) hours as needed for muscle spasms.     traMADol 50 MG  tablet  Commonly known as:  ULTRAM  Take 1-2 tablets (50-100 mg total) by mouth every 6 (six) hours.         Signed: West Pugh. Mj Willis   PA-C  08/27/2015, 10:19 AM

## 2015-08-28 ENCOUNTER — Encounter: Payer: Medicare Other | Admitting: Rehabilitative and Restorative Service Providers"

## 2015-08-31 ENCOUNTER — Encounter: Payer: Self-pay | Admitting: Rehabilitative and Restorative Service Providers"

## 2015-08-31 ENCOUNTER — Ambulatory Visit (INDEPENDENT_AMBULATORY_CARE_PROVIDER_SITE_OTHER): Payer: Medicare Other | Admitting: Rehabilitative and Restorative Service Providers"

## 2015-08-31 DIAGNOSIS — R531 Weakness: Secondary | ICD-10-CM | POA: Diagnosis not present

## 2015-08-31 DIAGNOSIS — R29898 Other symptoms and signs involving the musculoskeletal system: Secondary | ICD-10-CM | POA: Diagnosis not present

## 2015-08-31 DIAGNOSIS — M25562 Pain in left knee: Secondary | ICD-10-CM | POA: Diagnosis not present

## 2015-08-31 NOTE — Therapy (Signed)
[place], [place], [address] Phone: [phone]   Fax:  [phone]  Physical Therapy Treatment  Patient Details  Name: Albert Hicks MRN: [id number] Date of Birth: 01-Dec-1940 Referring Provider: Dr. Adriana Mccallum  Encounter Date: 08/31/2015      PT End of Session - 08/31/15 1114    Visit Number 3   Number of Visits 24   Date for PT Re-Evaluation 11/16/15   PT Start Time 1100   PT Stop Time 1154   PT Time Calculation (min) 54 min   Activity Tolerance Patient tolerated treatment well      Past Medical History  Diagnosis Date  . Hyperlipidemia   . Hypertension   . Gross hematuria   . BPH (benign prostatic hypertrophy)   . Elevated PSA   . OA (osteoarthritis) knees  . Hydronephrosis, left   . Acid reflux OCCASIONALLY TAKE TUMS  . Complication of anesthesia   . PONV (postoperative nausea and vomiting)   . Wears glasses   . Tinnitus     Past Surgical History  Procedure Laterality Date  . Total hip arthroplasty  07-09-2005  DR ALUISIO    RIGHT HIP OA  . Left knee surgery  1993  (APPROX)  . Appendectomy  AGE 75  . Cataract extraction w/ intraocular lens implant  2013    RIGHT EYE  . Cystoscopy  02/13/2012    Procedure: CYSTOSCOPY FLEXIBLE;  Surgeon: Hanley Ben, MD;  Location[facility];  Service: Urology;  Laterality: N/A;  . Prostatectomy N/A 08/03/2012    Procedure: SIMPLE RETROPUBIC PROSTATECTOMY, removal of left double J stent. ;  Surgeon: Hanley Ben, MD;  Location: [facility];  Service: Urology;  Laterality: N/A;  . Total knee arthroplasty Left 08/21/2015    Procedure: LEFT TOTAL KNEE ARTHROPLASTY;  Surgeon: Paralee Cancel, MD;  Location: [facility];  Service: Orthopedics;  Laterality: Left;    There were no vitals filed for this visit.      Subjective Assessment - 08/31/15 1114    Subjective Can tell a difference in his knee movement and use just since Monday.  Working on his walking and his exercises at home.    Currently in Pain? No/denies                         [facility] Adult PT Treatment/Exercise - 08/31/15 0001    Ambulation/Gait   Ambulation/Gait Yes   Ambulation Distance (Feet) 160 Feet  80 x2; 40 x 2    Assistive device Rolling walker   Gait Pattern Step-through pattern;Decreased hip/knee flexion - left;Decreased dorsiflexion - left;Antalgic;Trunk flexed   Ambulation Surface Level;Indoor   Gait Comments VC for erect posture; equal wt bearing; equal stride length    Neuro Re-ed    Neuro Re-ed Details  working on standing posture and alignment with walker and at wall- back to wall work on knee extension and hip extension    Knee/Hip Exercises: Aerobic   Nustep For ROM x 5 min    Knee/Hip Exercises: Standing   Other Standing Knee Exercises standing back at wall work on knee extension with hip extension 20 sec x 5 reps    Knee/Hip Exercises: Seated   Other Seated Knee/Hip Exercises seated knee flexion holding 20-30 sec    Sit to [employer] 5 reps  work on scoot to edge of chair - fwd motion to stand    Knee/Hip Exercises: Supine   Other  Supine Knee/Hip Exercises knee flexion with large ball 20 sec x 5; knee flexion gravity assisted 20 sec x 5; heel slide 20 sec x 5; AAROM knee flex/ext x 10    Electrical Stimulation   Electrical Stimulation Location Lt knee   Electrical Stimulation Action IFC   Electrical Stimulation Parameters to tolerance   Electrical Stimulation Goals Pain;Edema   Vasopneumatic   Number Minutes Vasopneumatic  15 minutes   Vasopnuematic Location  Knee   Vasopneumatic Pressure Low   Vasopneumatic Temperature  3 snowflakes                 PT Education - 08/31/15 1155    Education provided Yes   Education Details HEP   Person(s) Educated Patient   Methods Explanation;Demonstration;Tactile cues;Verbal cues;Handout   Comprehension Verbalized understanding;Returned demonstration;Verbal cues  required;Tactile cues required          PT Short Term Goals - 08/27/15 1648    PT SHORT TERM GOAL #1   Title Instruct patient in appropriate gait pattern with rolling walker and step through gait pattern with equal step length 09/22/15   Time 4   Period Weeks   Status On-going   PT SHORT TERM GOAL #2   Title Patient to transfer in and out of chair; bed; car indepently with good knee flexion 09/22/15    Time 4   Period Weeks   Status On-going   PT SHORT TERM GOAL #3   Title Lt knee flexion to 90 degrees; extension (-) 2 degrees 10/05/15   Time 6   Period Weeks   Status On-going   PT SHORT TERM GOAL #4   Title Independent in initial HEP 10/05/15   Time 6   Period Weeks   Status On-going           PT Long Term Goals - 08/27/15 1649    PT LONG TERM GOAL #1   Title 4+/5 to 5/5 strength Lt LE 11/15/15   Time 12   Period Weeks   Status On-going   PT LONG TERM GOAL #2   Title Lt knee flesion to 120 degrees extension 0 degrees 11/15/15   Time 12   Period Weeks   Status On-going   PT LONG TERM GOAL #3   Title Independent in HEP for discharge with return to golf 11/15/15   Time 12   Period Weeks   Status On-going   PT LONG TERM GOAL #4   Title Independent in gait with least restrictive assistive device to no assistive device 11/15/15   Time 12   Period Weeks   Status On-going   PT LONG TERM GOAL #5   Title Improve FOTO to </= 43% limitation 11/15/15   Time 12   Period Weeks   Status On-going             Patient will benefit from skilled therapeutic intervention in order to improve the following deficits and impairments:     Visit Diagnosis: Pain in left knee  Weakness generalized  Other symptoms and signs involving the musculoskeletal system     Problem List Patient Active Problem List   Diagnosis Date Noted  . Overweight (BMI 25.0-29.9) 08/22/2015  . S/P left TKA 08/21/2015  . Primary osteoarthritis of left knee 07/10/2015  . Prediabetes 06/22/2015   . Elevated fasting glucose 06/08/2015  . Primary osteoarthritis of both knees 01/18/2015  . Abnormal ECG 06/14/2014  . Nonspecific abnormal electrocardiogram (ECG) (EKG) 05/02/2014  . Colon polyp 04/17/2014  .  Osteoarthritis of both shoulders 07/15/2013  . Osteoarthritis of both knees 12/01/2012  . BPH (benign prostatic hypertrophy) with urinary obstruction 07/03/2012  . History of gout 06/14/2012  . Diarrhea 05/11/2012  . Pyelonephritis 05/09/2012  . Hypokalemia 05/09/2012  . Sepsis due to urinary tract infection ([place]) 05/09/2012  . Hyponatremia 05/09/2012  . Leukocytosis, unspecified 05/09/2012  . CKD (chronic kidney disease) stage 3, GFR 30-59 ml/min 03/23/2012  . Obstruction of kidney 02/23/2012  . Hyperlipidemia   . Hypertension     Georgianne Gritz Nilda Simmer PT, MPH  08/31/2015, 12:54 PM  [facility] [place], [place], [address] Phone: [phone]   Fax:  [phone]  Name: Rhyett Peete MRN: [id number] Date of Birth: 27-Sep-1940

## 2015-08-31 NOTE — Patient Instructions (Signed)
Standing with back at wall - tighten and straighten knees; push hips away from wall  Hold 20-30 sec x 5 2-3 times/day   Sit to stand with knees bent allowing shoulders to come forward over toes 5-10 reps  2-3 times/day

## 2015-09-04 ENCOUNTER — Encounter: Payer: Medicare Other | Admitting: Rehabilitative and Restorative Service Providers"

## 2015-09-05 ENCOUNTER — Encounter: Payer: Self-pay | Admitting: Rehabilitative and Restorative Service Providers"

## 2015-09-05 ENCOUNTER — Ambulatory Visit (INDEPENDENT_AMBULATORY_CARE_PROVIDER_SITE_OTHER): Payer: Medicare Other | Admitting: Rehabilitative and Restorative Service Providers"

## 2015-09-05 DIAGNOSIS — M25562 Pain in left knee: Secondary | ICD-10-CM

## 2015-09-05 DIAGNOSIS — R2689 Other abnormalities of gait and mobility: Secondary | ICD-10-CM | POA: Diagnosis not present

## 2015-09-05 DIAGNOSIS — R29898 Other symptoms and signs involving the musculoskeletal system: Secondary | ICD-10-CM | POA: Diagnosis not present

## 2015-09-05 DIAGNOSIS — Z471 Aftercare following joint replacement surgery: Secondary | ICD-10-CM | POA: Diagnosis not present

## 2015-09-05 DIAGNOSIS — Z96652 Presence of left artificial knee joint: Secondary | ICD-10-CM | POA: Diagnosis not present

## 2015-09-05 DIAGNOSIS — R531 Weakness: Secondary | ICD-10-CM | POA: Diagnosis not present

## 2015-09-05 NOTE — Therapy (Signed)
[place], [place], [address] Phone: [phone]   Fax:  [phone]  Physical Therapy Treatment  Patient Details  Name: Albert Hicks MRN: [id number] Date of Birth: [birth year] Referring Provider: Dr. Paralee Cancel   Encounter Date: 09/05/2015      PT End of Session - 09/05/15 1110    Visit Number 4   Number of Visits 24   Date for PT Re-Evaluation 11/16/15   PT Start Time 1058   PT Stop Time 1157   PT Time Calculation (min) 59 min   Activity Tolerance Patient tolerated treatment well      Past Medical History  Diagnosis Date  . Hyperlipidemia   . Hypertension   . Gross hematuria   . BPH (benign prostatic hypertrophy)   . Elevated PSA   . OA (osteoarthritis) knees  . Hydronephrosis, left   . Acid reflux OCCASIONALLY TAKE TUMS  . Complication of anesthesia   . PONV (postoperative nausea and vomiting)   . Wears glasses   . Tinnitus     Past Surgical History  Procedure Laterality Date  . Total hip arthroplasty  07-09-2005  DR ALUISIO    RIGHT HIP OA  . Left knee surgery  1993  (APPROX)  . Appendectomy  AGE 75  . Cataract extraction w/ intraocular lens implant  2013    RIGHT EYE  . Cystoscopy  02/13/2012    Procedure: CYSTOSCOPY FLEXIBLE;  Surgeon: Hanley Ben, MD;  Location: [facility];  Service: Urology;  Laterality: N/A;  . Prostatectomy N/A 08/03/2012    Procedure: SIMPLE RETROPUBIC PROSTATECTOMY, removal of left double J stent. ;  Surgeon: Hanley Ben, MD;  Location: [facility];  Service: Urology;  Laterality: N/A;  . Total knee arthroplasty Left 08/21/2015    Procedure: LEFT TOTAL KNEE ARTHROPLASTY;  Surgeon: Paralee Cancel, MD;  Location: [facility];  Service: Orthopedics;  Laterality: Left;    There were no vitals filed for this visit.      Subjective Assessment - 09/05/15 1114    Subjective Knee is improving - however he is having pain in the Lt ankle which he  feels is related to gout. he has had difficulty with gout in the past.    Currently in Pain? Yes   Pain Score 3    Pain Location Knee   Pain Orientation Left   Pain Descriptors / Indicators Aching   Pain Type Surgical pain   Pain Frequency Intermittent            [facility] PT Assessment - 09/05/15 0001    Assessment   Medical Diagnosis Lt TKA   Referring Provider Dr. Paralee Cancel    Onset Date/Surgical Date 08/21/15   Hand Dominance Right   Next MD Visit 09/05/15   Prior Therapy in the hospital    AROM   Left Knee Extension -3   Left Knee Flexion 95  supine knee flexion                     [facility] Adult PT Treatment/Exercise - 09/05/15 0001    Knee/Hip Exercises: Stretches   Passive Hamstring Stretch 3 reps;30 seconds   Knee/Hip Exercises: Aerobic   Nustep L5 x 7 min    Knee/Hip Exercises: Standing   Other Standing Knee Exercises standing - toe touch alt ~10inch step x 10 x 2 sets    Other Standing Knee Exercises standing back at wall work on knee extension with hip  extension 20 sec x 5 reps    Knee/Hip Exercises: Seated   Other Seated Knee/Hip Exercises seated knee flexion holding 20-30 sec   scooting forward - working on knee flexion    Sit to [employer] 5 reps  work on scoot to edge of chair - fwd motion to stand    Knee/Hip Exercises: Supine   [employer] Left;10 reps   Short Arc [employer] Strengthening;Left;10 reps  5 sec hold    Straight Leg Raises Left;10 reps  quad set before lift    Other Supine Knee/Hip Exercises knee flexion with large ball 20 sec x 5; knee flexion gravity assisted 20 sec x 5; heel slide 20 sec x 5; AAROM knee flex/ext x 10    Electrical Stimulation   Electrical Stimulation Location Lt knee   Electrical Stimulation Action ion repelling    Electrical Stimulation Parameters to tolerance   Electrical Stimulation Goals Pain;Edema   Vasopneumatic   Number Minutes Vasopneumatic  15 minutes   Vasopnuematic Location  Knee   Vasopneumatic  Pressure Low   Vasopneumatic Temperature  3 snowflakes                 PT Education - 09/05/15 1136    Education provided Yes   Education Details HEP    Person(s) Educated Patient   Methods Explanation;Demonstration;Tactile cues;Verbal cues;Handout   Comprehension Verbalized understanding;Returned demonstration;Verbal cues required;Tactile cues required          PT Short Term Goals - 09/05/15 1137    PT SHORT TERM GOAL #1   Title Instruct patient in appropriate gait pattern with rolling walker and step through gait pattern with equal step length 09/22/15   Time 4   Period Weeks   Status On-going   PT SHORT TERM GOAL #2   Title Patient to transfer in and out of chair; bed; car indepently with good knee flexion 09/22/15    Time 4   Period Weeks   Status On-going   PT SHORT TERM GOAL #3   Title Lt knee flexion to 90 degrees; extension (-) 2 degrees 10/05/15   Time 6   Period Weeks   Status On-going   PT SHORT TERM GOAL #4   Title Independent in initial HEP 10/05/15   Time 6   Period Weeks   Status On-going           PT Long Term Goals - 09/05/15 1137    PT LONG TERM GOAL #1   Title 4+/5 to 5/5 strength Lt LE 11/15/15   Time 12   Period Weeks   Status On-going   PT LONG TERM GOAL #2   Title Lt knee flesion to 120 degrees extension 0 degrees 11/15/15   Time 12   Period Weeks   Status On-going   PT LONG TERM GOAL #3   Title Independent in HEP for discharge with return to golf 11/15/15   Time 12   Period Weeks   Status On-going   PT LONG TERM GOAL #4   Title Independent in gait with least restrictive assistive device to no assistive device 11/15/15   Time 12   Period Weeks   Status On-going   PT LONG TERM GOAL #5   Title Improve FOTO to </= 43% limitation 11/15/15   Time 12   Period Weeks   Status On-going             Patient will benefit from skilled therapeutic intervention in order to improve the following  deficits and impairments:     Visit  Diagnosis: Pain in left knee  Weakness generalized  Other symptoms and signs involving the musculoskeletal system  Other abnormalities of gait and mobility     Problem List Patient Active Problem List   Diagnosis Date Noted  . Overweight (BMI 25.0-29.9) 08/22/2015  . S/P left TKA 08/21/2015  . Primary osteoarthritis of left knee 07/10/2015  . Prediabetes 06/22/2015  . Elevated fasting glucose 06/08/2015  . Primary osteoarthritis of both knees 01/18/2015  . Abnormal ECG 06/14/2014  . Nonspecific abnormal electrocardiogram (ECG) (EKG) 05/02/2014  . Colon polyp 04/17/2014  . Osteoarthritis of both shoulders 07/15/2013  . Osteoarthritis of both knees 12/01/2012  . BPH (benign prostatic hypertrophy) with urinary obstruction 07/03/2012  . History of gout 06/14/2012  . Diarrhea 05/11/2012  . Pyelonephritis 05/09/2012  . Hypokalemia 05/09/2012  . Sepsis due to urinary tract infection ([place]) 05/09/2012  . Hyponatremia 05/09/2012  . Leukocytosis, unspecified 05/09/2012  . CKD (chronic kidney disease) stage 3, GFR 30-59 ml/min 03/23/2012  . Obstruction of kidney 02/23/2012  . Hyperlipidemia   . Hypertension     Albert Hicks Nilda Simmer PT, MPH  09/05/2015, 11:48 AM  [place] [place], [place], [address] Phone: [phone]   Fax:  [phone]  Name: Harlie Congo MRN: [id number] Date of Birth: [birth year]

## 2015-09-07 ENCOUNTER — Encounter: Payer: Self-pay | Admitting: Rehabilitative and Restorative Service Providers"

## 2015-09-07 ENCOUNTER — Ambulatory Visit (INDEPENDENT_AMBULATORY_CARE_PROVIDER_SITE_OTHER): Payer: Medicare Other | Admitting: Rehabilitative and Restorative Service Providers"

## 2015-09-07 DIAGNOSIS — R531 Weakness: Secondary | ICD-10-CM

## 2015-09-07 DIAGNOSIS — R2689 Other abnormalities of gait and mobility: Secondary | ICD-10-CM

## 2015-09-07 DIAGNOSIS — M25562 Pain in left knee: Secondary | ICD-10-CM

## 2015-09-07 DIAGNOSIS — R29898 Other symptoms and signs involving the musculoskeletal system: Secondary | ICD-10-CM | POA: Diagnosis not present

## 2015-09-07 NOTE — Patient Instructions (Signed)
Balance: Unilateral    Attempt to balance on left leg, eyes open. Hold __20-30__ seconds. Repeat _5-8___ times per set  Do __2-3__ sessions per day. Can perform exercise with eyes closed.  Forward    Facing step, place left leg on step. Step up slowly, bringing hips in line with knee and shoulder. Bring right foot onto step. Reverse process to step back down. Repeat with other leg. Do __10__ repetitions, __1-2__ sets.   FUNCTIONAL MOBILITY: Lateral Step Up    Step up sideways, leading with right leg. __10_ reps per set, _1-2_ sets per day  Anterior Step-Down    Stand with both feet on _4__ inch step. Step down with right foot, touching heel to the floor and return _10__ times. _1-2__ sets _1-2__ times per day.

## 2015-09-07 NOTE — Therapy (Signed)
[place], [place], [address] Phone: [phone]   Fax:  [phone]  Physical Therapy Treatment  Patient Details  Name: Albert Hicks MRN: [id number] Date of Birth: 1940-09-24 Referring Provider: Dr. Paralee Cancel   Encounter Date: 09/07/2015      PT End of Session - 09/07/15 1058    Visit Number 5   Number of Visits 24   Date for PT Re-Evaluation 11/16/15   PT Start Time 1058   PT Stop Time 1152   PT Time Calculation (min) 54 min   Activity Tolerance Patient tolerated treatment well      Past Medical History  Diagnosis Date  . Hyperlipidemia   . Hypertension   . Gross hematuria   . BPH (benign prostatic hypertrophy)   . Elevated PSA   . OA (osteoarthritis) knees  . Hydronephrosis, left   . Acid reflux OCCASIONALLY TAKE TUMS  . Complication of anesthesia   . PONV (postoperative nausea and vomiting)   . Wears glasses   . Tinnitus     Past Surgical History  Procedure Laterality Date  . Total hip arthroplasty  07-09-2005  DR ALUISIO    RIGHT HIP OA  . Left knee surgery  1993  (APPROX)  . Appendectomy  AGE 75  . Cataract extraction w/ intraocular lens implant  2013    RIGHT EYE  . Cystoscopy  02/13/2012    Procedure: CYSTOSCOPY FLEXIBLE;  Surgeon: Hanley Ben, MD;  Location: [facility];  Service: Urology;  Laterality: N/A;  . Prostatectomy N/A 08/03/2012    Procedure: SIMPLE RETROPUBIC PROSTATECTOMY, removal of left double J stent. ;  Surgeon: Hanley Ben, MD;  Location: [facility];  Service: Urology;  Laterality: N/A;  . Total knee arthroplasty Left 08/21/2015    Procedure: LEFT TOTAL KNEE ARTHROPLASTY;  Surgeon: Paralee Cancel, MD;  Location: [facility];  Service: Orthopedics;  Laterality: Left;    There were no vitals filed for this visit.      Subjective Assessment - 09/07/15 1100    Subjective Seen by Dr. Alvan Dame who was pleased with progress. He is to wean from  walker to single point cane. Gout is better today.    Currently in Pain? Yes   Pain Score 2    Pain Location Knee   Pain Orientation Left   Pain Descriptors / Indicators Aching   Pain Type Surgical pain   Pain Frequency Intermittent   Aggravating Factors  bending knee    Pain Relieving Factors ice                         [facility] Adult PT Treatment/Exercise - 09/07/15 0001    Ambulation/Gait   Ambulation/Gait Yes   Ambulation Distance (Feet) 160 Feet  x6; 80 ft x 6 with single point cane and without A device   Assistive device Straight cane   Gait Pattern Step-through pattern   Ambulation Surface Level;Indoor   Gait Comments VC for erect posture; equal wt bearing; equal stride length    Knee/Hip Exercises: Stretches   Passive Hamstring Stretch 3 reps;30 seconds   Knee/Hip Exercises: Aerobic   Stationary Bike for ROM partial revolutions f/b backward revolutions x 10 min    Knee/Hip Exercises: Standing   Lateral Step Up Left;20 reps;Hand Hold: 2;Step Height: 6"   Forward Step Up Left;20 reps;Hand Hold: 2;Step Height: 6"   Step Down Right;20 reps;Hand Hold: 2;Step Height: 4"   SLS +  2 to +1 hand hold assist to 4-5 sec no hand hold - 6-8 trials 20-30 sec hold   Gait Training standing one foot fwd/one back working on wt shift fwd and back 30-50 reps x 3 sets    Other Standing Knee Exercises standing - toe touch alt ~10inch step x 10 x 2 sets    Knee/Hip Exercises: Seated   Other Seated Knee/Hip Exercises seated knee flexion holding 20-30 sec   scooting forward - working on knee flexion    Sit to [employer] 5 reps  work on scoot to edge of chair - fwd motion to stand    Knee/Hip Exercises: Supine   [employer] Left;10 reps   Short Arc [employer] Strengthening;Left;10 reps  5 sec hold    Straight Leg Raises Left;10 reps  quad set before Therapist, music Parameters to tolerance   Electrical Stimulation Goals Pain;Edema   Vasopneumatic   Number Minutes Vasopneumatic  15 minutes   Vasopnuematic Location  Knee   Vasopneumatic Pressure Low   Vasopneumatic Temperature  3 snowflakes                 PT Education - 09/07/15 1206    Education provided Yes   Education Details HEP   Person(s) Educated Patient   Methods Explanation;Demonstration;Tactile cues;Verbal cues;Handout   Comprehension Verbalized understanding;Returned demonstration;Verbal cues required;Tactile cues required          PT Short Term Goals - 09/05/15 1137    PT SHORT TERM GOAL #1   Title Instruct patient in appropriate gait pattern with rolling walker and step through gait pattern with equal step length 09/22/15   Time 4   Period Weeks   Status On-going   PT SHORT TERM GOAL #2   Title Patient to transfer in and out of chair; bed; car indepently with good knee flexion 09/22/15    Time 4   Period Weeks   Status On-going   PT SHORT TERM GOAL #3   Title Lt knee flexion to 90 degrees; extension (-) 2 degrees 10/05/15   Time 6   Period Weeks   Status On-going   PT SHORT TERM GOAL #4   Title Independent in initial HEP 10/05/15   Time 6   Period Weeks   Status On-going           PT Long Term Goals - 09/05/15 1137    PT LONG TERM GOAL #1   Title 4+/5 to 5/5 strength Lt LE 11/15/15   Time 12   Period Weeks   Status On-going   PT LONG TERM GOAL #2   Title Lt knee flesion to 120 degrees extension 0 degrees 11/15/15   Time 12   Period Weeks   Status On-going   PT LONG TERM GOAL #3   Title Independent in HEP for discharge with return to golf 11/15/15   Time 12   Period Weeks   Status On-going   PT LONG TERM GOAL #4   Title Independent in gait with least restrictive assistive device to no assistive device 11/15/15   Time 12   Period Weeks   Status On-going   PT LONG TERM GOAL #5   Title Improve FOTO to </= 43% limitation 11/15/15   Time 12    Period Weeks   Status On-going  Plan - 09/07/15 1058    Clinical Impression Statement Progressing well with transition from walker to [facility]. Continues to progress with exercise and activity level.    PT Frequency 2x / week   PT Duration 12 weeks   PT Treatment/Interventions Patient/family education;ADLs/Self Care Home Management;Cryotherapy;Electrical Stimulation;Iontophoresis 4mg /ml Dexamethasone;Moist Heat;Ultrasound;Neuromuscular re-education;Gait training;Manual techniques;Dry needling;Therapeutic activities;Therapeutic exercise   PT Next Visit Plan progress with ROM/strengthening as indicated; gait training; modalities as indicated    Consulted and Agree with Plan of Care Patient      Patient will benefit from skilled therapeutic intervention in order to improve the following deficits and impairments:  Postural dysfunction, Improper body mechanics, Pain, Decreased range of motion, Decreased mobility, Decreased strength, Abnormal gait, Decreased activity tolerance  Visit Diagnosis: Pain in left knee  Weakness generalized  Other symptoms and signs involving the musculoskeletal system  Other abnormalities of gait and mobility     Problem List Patient Active Problem List   Diagnosis Date Noted  . Overweight (BMI 25.0-29.9) 08/22/2015  . S/P left TKA 08/21/2015  . Primary osteoarthritis of left knee 07/10/2015  . Prediabetes 06/22/2015  . Elevated fasting glucose 06/08/2015  . Primary osteoarthritis of both knees 01/18/2015  . Abnormal ECG 06/14/2014  . Nonspecific abnormal electrocardiogram (ECG) (EKG) 05/02/2014  . Colon polyp 04/17/2014  . Osteoarthritis of both shoulders 07/15/2013  . Osteoarthritis of both knees 12/01/2012  . BPH (benign prostatic hypertrophy) with urinary obstruction 07/03/2012  . History of gout 06/14/2012  . Diarrhea 05/11/2012  . Pyelonephritis 05/09/2012  . Hypokalemia 05/09/2012  . Sepsis due to urinary tract infection  ([place]) 05/09/2012  . Hyponatremia 05/09/2012  . Leukocytosis, unspecified 05/09/2012  . CKD (chronic kidney disease) stage 3, GFR 30-59 ml/min 03/23/2012  . Obstruction of kidney 02/23/2012  . Hyperlipidemia   . Hypertension     Celyn Nilda Simmer PT, MPH  09/07/2015, 12:07 PM  [facility] [place], [place], [address] Phone: [phone]   Fax:  [phone]  Name: Albert Hicks MRN: [id number] Date of Birth: 12-05-1940

## 2015-09-11 ENCOUNTER — Ambulatory Visit (INDEPENDENT_AMBULATORY_CARE_PROVIDER_SITE_OTHER): Payer: Medicare Other | Admitting: Rehabilitative and Restorative Service Providers"

## 2015-09-11 ENCOUNTER — Encounter: Payer: Self-pay | Admitting: Rehabilitative and Restorative Service Providers"

## 2015-09-11 DIAGNOSIS — M25562 Pain in left knee: Secondary | ICD-10-CM | POA: Diagnosis present

## 2015-09-11 DIAGNOSIS — R531 Weakness: Secondary | ICD-10-CM

## 2015-09-11 DIAGNOSIS — R2689 Other abnormalities of gait and mobility: Secondary | ICD-10-CM | POA: Diagnosis not present

## 2015-09-11 DIAGNOSIS — R29898 Other symptoms and signs involving the musculoskeletal system: Secondary | ICD-10-CM

## 2015-09-11 NOTE — Therapy (Signed)
[place], [place], [address] Phone: [phone]   Fax:  [phone]  Physical Therapy Treatment  Patient Details  Name: Albert Hicks MRN: [id number] Date of Birth: Aug 04, 1940 Referring Provider: Dr. Adriana Mccallum  Encounter Date: 09/11/2015      PT End of Session - 09/11/15 1214    Visit Number 6   Number of Visits 24   Date for PT Re-Evaluation 11/16/15   PT Start Time [id number]   PT Stop Time 1243   PT Time Calculation (min) 58 min   Activity Tolerance Patient tolerated treatment well      Past Medical History  Diagnosis Date  . Hyperlipidemia   . Hypertension   . Gross hematuria   . BPH (benign prostatic hypertrophy)   . Elevated PSA   . OA (osteoarthritis) knees  . Hydronephrosis, left   . Acid reflux OCCASIONALLY TAKE TUMS  . Complication of anesthesia   . PONV (postoperative nausea and vomiting)   . Wears glasses   . Tinnitus     Past Surgical History  Procedure Laterality Date  . Total hip arthroplasty  07-09-2005  DR ALUISIO    RIGHT HIP OA  . Left knee surgery  1993  (APPROX)  . Appendectomy  AGE 75  . Cataract extraction w/ intraocular lens implant  2013    RIGHT EYE  . Cystoscopy  02/13/2012    Procedure: CYSTOSCOPY FLEXIBLE;  Surgeon: Hanley Ben, MD;  Location: [facility];  Service: Urology;  Laterality: N/A;  . Prostatectomy N/A 08/03/2012    Procedure: SIMPLE RETROPUBIC PROSTATECTOMY, removal of left double J stent. ;  Surgeon: Hanley Ben, MD;  Location: [facility];  Service: Urology;  Laterality: N/A;  . Total knee arthroplasty Left 08/21/2015    Procedure: LEFT TOTAL KNEE ARTHROPLASTY;  Surgeon: Paralee Cancel, MD;  Location: [facility];  Service: Orthopedics;  Laterality: Left;    There were no vitals filed for this visit.      Subjective Assessment - 09/11/15 1235    Subjective Pleased with his progress. Can tell he is walking better. Anxious to return  to the gym. Very pleased that he walked down the stairs today step over step - has not done that in over 3 years.    Currently in Pain? Yes   Pain Score 2    Pain Location Knee   Pain Orientation Right   Pain Descriptors / Indicators Aching   Pain Type Surgical pain            [facility] PT Assessment - 09/11/15 0001    Assessment   Medical Diagnosis Lt TKA   Referring Provider Dr. Adriana Mccallum   Onset Date/Surgical Date 08/21/15   Hand Dominance Right   Next MD Visit 10/03/15   AROM   Left Knee Extension -3   Left Knee Flexion 100  supine                      [facility] Adult PT Treatment/Exercise - 09/11/15 0001    Ambulation/Gait   Ambulation/Gait Yes   Ambulation Distance (Feet) 250 Feet  x2 in hallway   Assistive device Straight cane   Gait Pattern Step-through pattern   Ambulation Surface Level;Indoor   Stairs --  up/down 2 flights of 14 stairs step over step 1 rail Rt down   Gait Comments VC for erect posture; equal wt bearing; equal stride length    Knee/Hip Exercises: Standing  Lateral Step Up Left;20 reps;Hand Hold: 2;Step Height: 6"   Forward Step Up Left;20 reps;Hand Hold: 2;Step Height: 6"   Step Down Right;20 reps;Hand Hold: 2;Step Height: 4"   Gait Training standing one foot fwd/one back working on wt shift fwd and back 30-50 reps x 3 sets    Other Standing Knee Exercises standing - toe touch alt ~10inch step x 10 x 2 sets    Other Standing Knee Exercises knee flexion foot on 12 inch step flexing knee fwd - 5 reps 30 sec hold    Knee/Hip Exercises: Seated   Other Seated Knee/Hip Exercises seated knee flexion holding 20-30 sec   scooting forward - working on knee flexion    Sit to [employer] 5 reps  work on scoot to edge of chair - fwd motion to stand    Knee/Hip Exercises: Supine   Quad Sets Left;10 reps   Other Supine Knee/Hip Exercises knee flexion with large ball 20 sec x 5; knee flexion gravity assisted 20 sec x 5; heel slide 20 sec x 5; AAROM knee  flex/ext x 10    Electrical Stimulation   Electrical Stimulation Location Lt knee   Electrical Stimulation Action ion repelling    Electrical Stimulation Parameters to tolerance   Electrical Stimulation Goals Pain;Edema   Vasopneumatic   Number Minutes Vasopneumatic  15 minutes   Vasopnuematic Location  Knee   Vasopneumatic Pressure Low   Vasopneumatic Temperature  3 snowflakes                 PT Education - 09/11/15 1237    Education provided Yes   Education Details to work on descending stairs at home with Rt rail step over step    [employer]) Educated Patient   Methods Explanation;Demonstration;Verbal cues   Comprehension Verbalized understanding;Returned demonstration;Verbal cues required          PT Short Term Goals - 09/11/15 1217    PT SHORT TERM GOAL #1   Title Instruct patient in appropriate gait pattern with rolling walker and step through gait pattern with equal step length 09/22/15   Time 4   Period Weeks   Status On-going   PT SHORT TERM GOAL #2   Title Patient to transfer in and out of chair; bed; car indepently with good knee flexion 09/22/15    Time 4   Period Weeks   Status On-going   PT SHORT TERM GOAL #3   Title Lt knee flexion to 90 degrees; extension (-) 2 degrees 10/05/15   Time 6   Period Weeks   Status On-going   PT SHORT TERM GOAL #4   Title Independent in initial HEP 10/05/15   Time 6   Period Weeks   Status On-going           PT Long Term Goals - 09/11/15 1215    PT LONG TERM GOAL #1   Title 4+/5 to 5/5 strength Lt LE 11/15/15   Time 12   Period Weeks   Status On-going   PT LONG TERM GOAL #2   Title Lt knee flesion to 120 degrees extension 0 degrees 11/15/15   Time 12   Period Weeks   Status On-going   PT LONG TERM GOAL #3   Title Independent in HEP for discharge with return to golf 11/15/15   Time 12   Period Weeks   Status On-going   PT LONG TERM GOAL #4   Title Independent in gait with least restrictive assistive device  to no assistive device 11/15/15   Time 12   Period Weeks   Status On-going   PT LONG TERM GOAL #5   Title Improve FOTO to </= 43% limitation 11/15/15   Time 12   Period Weeks   Status On-going               Plan - 09/11/15 1215    Clinical Impression Statement Still has some trouble going down the stairs - has a railing on the Rt side only - improved with practice. Patient is progressing well with ROM and strength. Patient will return to gym to use stationary bike with light to moderate resistance 10-15 min and upper body work.    Rehab Potential Good   PT Frequency 2x / week   PT Duration 12 weeks   PT Treatment/Interventions Patient/family education;ADLs/Self Care Home Management;Cryotherapy;Electrical Stimulation;Iontophoresis 4mg /ml Dexamethasone;Moist Heat;Ultrasound;Neuromuscular re-education;Gait training;Manual techniques;Dry needling;Therapeutic activities;Therapeutic exercise   PT Next Visit Plan progress with ROM/strengthening as indicated; gait training; modalities as indicated    Consulted and Agree with Plan of Care Patient      Patient will benefit from skilled therapeutic intervention in order to improve the following deficits and impairments:  Postural dysfunction, Improper body mechanics, Pain, Decreased range of motion, Decreased mobility, Decreased strength, Abnormal gait, Decreased activity tolerance  Visit Diagnosis: Pain in left knee  Weakness generalized  Other symptoms and signs involving the musculoskeletal system  Other abnormalities of gait and mobility     Problem List Patient Active Problem List   Diagnosis Date Noted  . Overweight (BMI 25.0-29.9) 08/22/2015  . S/P left TKA 08/21/2015  . Primary osteoarthritis of left knee 07/10/2015  . Prediabetes 06/22/2015  . Elevated fasting glucose 06/08/2015  . Primary osteoarthritis of both knees 01/18/2015  . Abnormal ECG 06/14/2014  . Nonspecific abnormal electrocardiogram (ECG) (EKG)  05/02/2014  . Colon polyp 04/17/2014  . Osteoarthritis of both shoulders 07/15/2013  . Osteoarthritis of both knees 12/01/2012  . BPH (benign prostatic hypertrophy) with urinary obstruction 07/03/2012  . History of gout 06/14/2012  . Diarrhea 05/11/2012  . Pyelonephritis 05/09/2012  . Hypokalemia 05/09/2012  . Sepsis due to urinary tract infection ([place]) 05/09/2012  . Hyponatremia 05/09/2012  . Leukocytosis, unspecified 05/09/2012  . CKD (chronic kidney disease) stage 3, GFR 30-59 ml/min 03/23/2012  . Obstruction of kidney 02/23/2012  . Hyperlipidemia   . Hypertension     Soni Kegel Nilda Simmer PT, MPH  09/11/2015, 12:39 PM  [facility] [place], [place], [address] Phone: [phone]   Fax:  [phone]  Name: Mckai Deats MRN: [id number] Date of Birth: 03/13/1941

## 2015-09-14 ENCOUNTER — Ambulatory Visit (INDEPENDENT_AMBULATORY_CARE_PROVIDER_SITE_OTHER): Payer: Medicare Other | Admitting: Physical Therapy

## 2015-09-14 DIAGNOSIS — R2689 Other abnormalities of gait and mobility: Secondary | ICD-10-CM | POA: Diagnosis not present

## 2015-09-14 DIAGNOSIS — M25562 Pain in left knee: Secondary | ICD-10-CM | POA: Diagnosis present

## 2015-09-14 DIAGNOSIS — R531 Weakness: Secondary | ICD-10-CM | POA: Diagnosis not present

## 2015-09-14 DIAGNOSIS — R29898 Other symptoms and signs involving the musculoskeletal system: Secondary | ICD-10-CM | POA: Diagnosis not present

## 2015-09-14 NOTE — Therapy (Signed)
[place], [place], [address] Phone: [phone]   Fax:  [phone]  Physical Therapy Treatment  Patient Details  Name: Albert Hicks MRN: [id number] Date of Birth: 1941/01/30 Referring Provider: Dr. Paralee Cancel  Encounter Date: 09/14/2015      PT End of Session - 09/14/15 1154    Visit Number 7   Number of Visits 24   Date for PT Re-Evaluation 11/16/15   PT Start Time 1100   PT Stop Time 1203   PT Time Calculation (min) 63 min   Activity Tolerance Patient limited by fatigue      Past Medical History  Diagnosis Date  . Hyperlipidemia   . Hypertension   . Gross hematuria   . BPH (benign prostatic hypertrophy)   . Elevated PSA   . OA (osteoarthritis) knees  . Hydronephrosis, left   . Acid reflux OCCASIONALLY TAKE TUMS  . Complication of anesthesia   . PONV (postoperative nausea and vomiting)   . Wears glasses   . Tinnitus     Past Surgical History  Procedure Laterality Date  . Total hip arthroplasty  07-09-2005  DR ALUISIO    RIGHT HIP OA  . Left knee surgery  1993  (APPROX)  . Appendectomy  AGE 75  . Cataract extraction w/ intraocular lens implant  2013    RIGHT EYE  . Cystoscopy  02/13/2012    Procedure: CYSTOSCOPY FLEXIBLE;  Surgeon: Hanley Ben, MD;  Location: [facility];  Service: Urology;  Laterality: N/A;  . Prostatectomy N/A 08/03/2012    Procedure: SIMPLE RETROPUBIC PROSTATECTOMY, removal of left double J stent. ;  Surgeon: Hanley Ben, MD;  Location: [facility];  Service: Urology;  Laterality: N/A;  . Total knee arthroplasty Left 08/21/2015    Procedure: LEFT TOTAL KNEE ARTHROPLASTY;  Surgeon: Paralee Cancel, MD;  Location: [facility];  Service: Orthopedics;  Laterality: Left;    There were no vitals filed for this visit.      Subjective Assessment - 09/14/15 1106    Subjective " I over did it yesterday".  Pt reports he rode bike at gym for 15 min and  lifted weight for upper body, then went shopping at [facility].  He noticed the lateral side of Lt knee after he left the store.  He also said, "I psyched out, couldn't do my steps at home (reciprocal pattern)   Patient Stated Goals get back on the golf course - knee to move well and be strong    Currently in Pain? Yes   Pain Score 3    Pain Location Knee   Pain Orientation Left;Medial  "where bone spur was"    Pain Descriptors / Indicators Burning   Pain Frequency Intermittent   Aggravating Factors  ?   Pain Relieving Factors ?            [facility] PT Assessment - 09/14/15 0001    Assessment   Medical Diagnosis Lt TKA   Referring Provider Dr. Paralee Cancel   Onset Date/Surgical Date 08/21/15   Hand Dominance Right   Next MD Visit 10/03/15          [facility] Adult PT Treatment/Exercise - 09/14/15 0001    Knee/Hip Exercises: Stretches   Passive Hamstring Stretch Left;3 reps;30 seconds   Quad Stretch 3 reps;Left;30 seconds   Gastroc Stretch Left;Right;3 reps;30 seconds   Knee/Hip Exercises: Aerobic   Stationary Bike L1: 6 min    Knee/Hip Exercises: Standing  Lateral Step Up Left;1 set;10 reps;Hand Hold: 1;Step Height: 8"   Forward Step Up Left;2 sets;10 reps;Step Height: 8";Hand Hold: 1   Step Down Right;2 sets;10 reps;Hand Hold: 2;Step Height: 4"   Knee/Hip Exercises: Seated   Other Seated Knee/Hip Exercises seated knee flexion holding 20-30 sec   scooting forward - working on knee flexion    Modalities   Modalities Cryotherapy;Electrical Stimulation;Moist Heat   Moist Heat Therapy   Number Minutes Moist Heat 15 Minutes   Moist Heat Location Knee  posterior Lt   Cryotherapy   Number Minutes Cryotherapy 15 Minutes   Cryotherapy Location Knee  anterior Lt   Type of Cryotherapy Ice pack   Electrical Stimulation   Electrical Stimulation Location Lt knee   Electrical Stimulation Action ion repelling    Electrical Stimulation Parameters to tolerance   Electrical Stimulation Goals  Edema;Pain   Manual Therapy   Manual Therapy Passive ROM;Taping;Other (comment);Joint mobilization   Joint Mobilization PA tibiofemoral jt mob to increase flexion.    Passive ROM Lt knee overpressure into extension, to tolerance   Other Manual Therapy scar massage to Lt knee    Kinesiotex Edema   Kinesiotix   Edema Rock tape place on each side of incision, only crossing over incision at top of knee.                 PT Education - 09/14/15 1201    Education provided Yes   Education Details HEP    Person(s) Educated Patient   Methods Explanation;Demonstration   Comprehension Verbalized understanding;Returned demonstration          PT Short Term Goals - 09/11/15 1217    PT SHORT TERM GOAL #1   Title Instruct patient in appropriate gait pattern with rolling walker and step through gait pattern with equal step length 09/22/15   Time 4   Period Weeks   Status On-going   PT SHORT TERM GOAL #2   Title Patient to transfer in and out of chair; bed; car indepently with good knee flexion 09/22/15    Time 4   Period Weeks   Status On-going   PT SHORT TERM GOAL #3   Title Lt knee flexion to 90 degrees; extension (-) 2 degrees 10/05/15   Time 6   Period Weeks   Status On-going   PT SHORT TERM GOAL #4   Title Independent in initial HEP 10/05/15   Time 6   Period Weeks   Status On-going           PT Long Term Goals - 09/11/15 1215    PT LONG TERM GOAL #1   Title 4+/5 to 5/5 strength Lt LE 11/15/15   Time 12   Period Weeks   Status On-going   PT LONG TERM GOAL #2   Title Lt knee flesion to 120 degrees extension 0 degrees 11/15/15   Time 12   Period Weeks   Status On-going   PT LONG TERM GOAL #3   Title Independent in HEP for discharge with return to golf 11/15/15   Time 12   Period Weeks   Status On-going   PT LONG TERM GOAL #4   Title Independent in gait with least restrictive assistive device to no assistive device 11/15/15   Time 12   Period Weeks   Status  On-going   PT LONG TERM GOAL #5   Title Improve FOTO to </= 43% limitation 11/15/15   Time 12   Period Weeks  Status On-going               Plan - 09/14/15 1202    Clinical Impression Statement Pt fatigued quickly with ther ex today; pt reported he is still tired from "overdoing it" yesterday.  Making gradual progress towards established goals.    Rehab Potential Good   PT Frequency 2x / week   PT Duration 12 weeks   PT Treatment/Interventions Patient/family education;ADLs/Self Care Home Management;Cryotherapy;Electrical Stimulation;Iontophoresis 4mg /ml Dexamethasone;Moist Heat;Ultrasound;Neuromuscular re-education;Gait training;Manual techniques;Dry needling;Therapeutic activities;Therapeutic exercise   PT Next Visit Plan progress with ROM/strengthening as indicated; gait training; modalities as indicated    Consulted and Agree with Plan of Care Patient      Patient will benefit from skilled therapeutic intervention in order to improve the following deficits and impairments:  Postural dysfunction, Improper body mechanics, Pain, Decreased range of motion, Decreased mobility, Decreased strength, Abnormal gait, Decreased activity tolerance  Visit Diagnosis: Pain in left knee  Weakness generalized  Other abnormalities of gait and mobility  Other symptoms and signs involving the musculoskeletal system     Problem List Patient Active Problem List   Diagnosis Date Noted  . Overweight (BMI 25.0-29.9) 08/22/2015  . S/P left TKA 08/21/2015  . Primary osteoarthritis of left knee 07/10/2015  . Prediabetes 06/22/2015  . Elevated fasting glucose 06/08/2015  . Primary osteoarthritis of both knees 01/18/2015  . Abnormal ECG 06/14/2014  . Nonspecific abnormal electrocardiogram (ECG) (EKG) 05/02/2014  . Colon polyp 04/17/2014  . Osteoarthritis of both shoulders 07/15/2013  . Osteoarthritis of both knees 12/01/2012  . BPH (benign prostatic hypertrophy) with urinary obstruction  07/03/2012  . History of gout 06/14/2012  . Diarrhea 05/11/2012  . Pyelonephritis 05/09/2012  . Hypokalemia 05/09/2012  . Sepsis due to urinary tract infection ([place]) 05/09/2012  . Hyponatremia 05/09/2012  . Leukocytosis, unspecified 05/09/2012  . CKD (chronic kidney disease) stage 3, GFR 30-59 ml/min 03/23/2012  . Obstruction of kidney 02/23/2012  . Hyperlipidemia   . Hypertension    Kerin Perna, PTA 09/14/2015 12:53 PM  [place], [place], [address] Phone: [phone]   Fax:  [phone]  Name: Christie Badami MRN: [id number] Date of Birth: 01/19/1941

## 2015-09-14 NOTE — Patient Instructions (Signed)
Knee Extension Mobilization: Hang (Prone)    With table supporting thighs, place _0___ pound weight on right ankle. Hold __1-3__ minutes. Repeat _1-2___ times per set.  Do _1-2___ sessions per day.  KNEE: Quadriceps - Prone    Place strap around ankle. Bring ankle toward buttocks. Press hip into surface. Hold __30-45_ seconds. _2-3__ reps per set, __2_ sets per day, _7__ days per week  * scar massage 5 min, 1x/day.  * can place heating pad to back of leg prior to hamstring stretch.     [facility] [place], [place] [address]  [phone] (office) [phone] (fax)

## 2015-09-19 ENCOUNTER — Ambulatory Visit (INDEPENDENT_AMBULATORY_CARE_PROVIDER_SITE_OTHER): Payer: Medicare Other | Admitting: Physical Therapy

## 2015-09-19 DIAGNOSIS — R2689 Other abnormalities of gait and mobility: Secondary | ICD-10-CM | POA: Diagnosis not present

## 2015-09-19 DIAGNOSIS — M25562 Pain in left knee: Secondary | ICD-10-CM | POA: Diagnosis present

## 2015-09-19 DIAGNOSIS — R531 Weakness: Secondary | ICD-10-CM

## 2015-09-19 DIAGNOSIS — R29898 Other symptoms and signs involving the musculoskeletal system: Secondary | ICD-10-CM | POA: Diagnosis not present

## 2015-09-19 NOTE — Therapy (Signed)
[place], [place], [address] Phone: [phone]   Fax:  [phone]  Physical Therapy Treatment  Patient Details  Name: Albert Hicks MRN: [id number] Date of Birth: 05-28-1940 Referring Provider: Dr. Paralee Cancel  Encounter Date: 09/19/2015      PT End of Session - 09/19/15 1524    Visit Number 8   Number of Visits 24   Date for PT Re-Evaluation 11/16/15   PT Start Time 1455   PT Stop Time 1605   PT Time Calculation (min) 70 min   Activity Tolerance Patient tolerated treatment well      Past Medical History  Diagnosis Date  . Hyperlipidemia   . Hypertension   . Gross hematuria   . BPH (benign prostatic hypertrophy)   . Elevated PSA   . OA (osteoarthritis) knees  . Hydronephrosis, left   . Acid reflux OCCASIONALLY TAKE TUMS  . Complication of anesthesia   . PONV (postoperative nausea and vomiting)   . Wears glasses   . Tinnitus     Past Surgical History  Procedure Laterality Date  . Total hip arthroplasty  07-09-2005  DR ALUISIO    RIGHT HIP OA  . Left knee surgery  1993  (APPROX)  . Appendectomy  AGE 56  . Cataract extraction w/ intraocular lens implant  2013    RIGHT EYE  . Cystoscopy  02/13/2012    Procedure: CYSTOSCOPY FLEXIBLE;  Surgeon: Hanley Ben, MD;  Location: [facility];  Service: Urology;  Laterality: N/A;  . Prostatectomy N/A 08/03/2012    Procedure: SIMPLE RETROPUBIC PROSTATECTOMY, removal of left double J stent. ;  Surgeon: Hanley Ben, MD;  Location: [facility];  Service: Urology;  Laterality: N/A;  . Total knee arthroplasty Left 08/21/2015    Procedure: LEFT TOTAL KNEE ARTHROPLASTY;  Surgeon: Paralee Cancel, MD;  Location: [facility];  Service: Orthopedics;  Laterality: Left;    There were no vitals filed for this visit.      Subjective Assessment - 09/19/15 1522    Subjective Pt has been taking muscle relaxer 2x/day for "muscle spasm" on inside of  Lt knee.  This has helped.    HEP going well.    Patient Stated Goals get back on the golf course - knee to move well and be strong    Currently in Pain? Yes   Pain Score 2    Pain Location Knee   Pain Orientation Left;Medial   Pain Descriptors / Indicators Burning            [facility] PT Assessment - 09/19/15 0001    AROM   Left Knee Extension -3 after overpressure/ manual therapy   Left Knee Flexion 102  seated scoot          [facility] Adult PT Treatment/Exercise - 09/19/15 0001    Knee/Hip Exercises: Aerobic   Nustep L4: 7 min    Knee/Hip Exercises: Machines for Strengthening   Cybex Knee Extension 1 Plate:  BLE to knee ext (30 deg knee flexion) and LLE controlled descent. x  8 reps    Cybex Leg Press 4 plates: LLE only x 8 reps    Knee/Hip Exercises: Standing   SLS Lt SLS with occasional UE support x 15 sec x 4 trials (on blue foam pad)   Other Standing Knee Exercises Forward Lt lunge on 13" step for increased Lt knee flexion x 10 sec x 10 reps;  followed by Lt hamstring stretch x 10  sec x 10 reps.    Knee/Hip Exercises: Seated   Other Seated Knee/Hip Exercises seated knee flexion holding 20-30 sec x 3 reps  scooting forward - working on knee flexion    Knee/Hip Exercises: Supine   Heel Slides Left  1 long rep for measurement.    Heel Slides Limitations (up to 100 deg)   Straight Leg Raises Strengthening;Left;1 set;15 reps   Moist Heat Therapy   Number Minutes Moist Heat 15 Minutes   Moist Heat Location Knee  posterior Lt   Cryotherapy   Number Minutes Cryotherapy 15 Minutes   Cryotherapy Location Knee  anterior Lt   Type of Cryotherapy Ice pack   Electrical Stimulation   Electrical Stimulation Location Lt knee    Electrical Stimulation Action ion repelling    Electrical Stimulation Parameters to tolerance    Electrical Stimulation Goals Edema;Pain   Manual Therapy   Passive ROM Lt knee overpressure into extension, to tolerance; patellar mobs in all directions.     Other Manual Therapy scar massage to Lt knee    Kinesiotex Edema   Kinesiotix   Edema Rock tape place on each side of incision, only crossing over incision at top of knee.           PT Short Term Goals - 09/19/15 1621    PT SHORT TERM GOAL #1   Title Instruct patient in appropriate gait pattern with rolling walker and step through gait pattern with equal step length 09/22/15   Time 4   Period Weeks   Status Achieved   PT SHORT TERM GOAL #2   Title Patient to transfer in and out of chair; bed; car indepently with good knee flexion 09/22/15    Time 4   Period Weeks   Status Achieved   PT SHORT TERM GOAL #3   Title Lt knee flexion to 90 degrees; extension (-) 2 degrees 10/05/15   Time 6   Period Weeks   Status Partially Met   PT SHORT TERM GOAL #4   Title Independent in initial HEP 10/05/15   Time 6   Period Weeks   Status Achieved           PT Long Term Goals - 09/19/15 1526    PT LONG TERM GOAL #1   Title 4+/5 to 5/5 strength Lt LE 11/15/15   Time 12   Period Weeks   Status On-going   PT LONG TERM GOAL #2   Title Lt knee flexion to 120 degrees, extension 0 degrees 11/15/15   Time 12   Period Weeks   Status On-going   PT LONG TERM GOAL #3   Title Independent in HEP for discharge with return to golf 11/15/15   Time 12   Period Weeks   Status On-going   PT LONG TERM GOAL #4   Title Independent in gait with least restrictive assistive device to no assistive device 11/15/15   Time 12   Period Weeks   Status On-going   PT LONG TERM GOAL #5   Title Improve FOTO to </= 43% limitation 11/15/15   Time 12   Period Weeks   Status On-going               Plan - 09/19/15 1621    Clinical Impression Statement Pt tolerated all exercises well, with mild increase in pain. Pt demonstrated slight increase in Lt knee flexion ROM this visit.  Good response to Rock tape; reapplied this visit. Pt has partially met  STG 3 and fully met STG 1,2,and 4.     Rehab Potential Good   PT  Frequency 2x / week   PT Duration 12 weeks   PT Treatment/Interventions Patient/family education;ADLs/Self Care Home Management;Cryotherapy;Electrical Stimulation;Iontophoresis 41m/ml Dexamethasone;Moist Heat;Ultrasound;Neuromuscular re-education;Gait training;Manual techniques;Dry needling;Therapeutic activities;Therapeutic exercise   PT Next Visit Plan progress with ROM/strengthening as indicated; gait training; modalities as indicated    Consulted and Agree with Plan of Care Patient      Patient will benefit from skilled therapeutic intervention in order to improve the following deficits and impairments:  Postural dysfunction, Improper body mechanics, Pain, Decreased range of motion, Decreased mobility, Decreased strength, Abnormal gait, Decreased activity tolerance  Visit Diagnosis: Pain in left knee  Weakness generalized  Other abnormalities of gait and mobility  Other symptoms and signs involving the musculoskeletal system     Problem List Patient Active Problem List   Diagnosis Date Noted  . Overweight (BMI 25.0-29.9) 08/22/2015  . S/P left TKA 08/21/2015  . Primary osteoarthritis of left knee 07/10/2015  . Prediabetes 06/22/2015  . Elevated fasting glucose 06/08/2015  . Primary osteoarthritis of both knees 01/18/2015  . Abnormal ECG 06/14/2014  . Nonspecific abnormal electrocardiogram (ECG) (EKG) 05/02/2014  . Colon polyp 04/17/2014  . Osteoarthritis of both shoulders 07/15/2013  . Osteoarthritis of both knees 12/01/2012  . BPH (benign prostatic hypertrophy) with urinary obstruction 07/03/2012  . History of gout 06/14/2012  . Diarrhea 05/11/2012  . Pyelonephritis 05/09/2012  . Hypokalemia 05/09/2012  . Sepsis due to urinary tract infection ([place] 05/09/2012  . Hyponatremia 05/09/2012  . Leukocytosis, unspecified 05/09/2012  . CKD (chronic kidney disease) stage 3, GFR 30-59 ml/min 03/23/2012  . Obstruction of kidney 02/23/2012  . Hyperlipidemia   . Hypertension     JKerin Perna PTA 09/19/2015 4:23 PM  [facility] [address]Phone: [phone]  Fax:  [phone] Name: ADak SzumskiMRN: [id number] of Birth: 1November 26, 1942

## 2015-09-21 ENCOUNTER — Ambulatory Visit (INDEPENDENT_AMBULATORY_CARE_PROVIDER_SITE_OTHER): Payer: Medicare Other | Admitting: Physical Therapy

## 2015-09-21 DIAGNOSIS — M25562 Pain in left knee: Secondary | ICD-10-CM | POA: Diagnosis present

## 2015-09-21 DIAGNOSIS — R531 Weakness: Secondary | ICD-10-CM | POA: Diagnosis not present

## 2015-09-21 DIAGNOSIS — R29898 Other symptoms and signs involving the musculoskeletal system: Secondary | ICD-10-CM

## 2015-09-21 DIAGNOSIS — R2689 Other abnormalities of gait and mobility: Secondary | ICD-10-CM | POA: Diagnosis not present

## 2015-09-21 NOTE — Therapy (Signed)
[place], [place], [address] Phone: [phone]   Fax:  [phone]  Physical Therapy Treatment  Patient Details  Name: Albert Hicks MRN: [id number] Date of Birth: 01-Feb-1941 Referring Provider: Dr. Adriana Mccallum   Encounter Date: 09/21/2015      PT End of Session - 09/21/15 1458    Visit Number 9   Number of Visits 24   Date for PT Re-Evaluation 11/16/15   PT Start Time 5597   PT Stop Time 4163   PT Time Calculation (min) 63 min   Activity Tolerance Patient tolerated treatment well      Past Medical History  Diagnosis Date  . Hyperlipidemia   . Hypertension   . Gross hematuria   . BPH (benign prostatic hypertrophy)   . Elevated PSA   . OA (osteoarthritis) knees  . Hydronephrosis, left   . Acid reflux OCCASIONALLY TAKE TUMS  . Complication of anesthesia   . PONV (postoperative nausea and vomiting)   . Wears glasses   . Tinnitus     Past Surgical History  Procedure Laterality Date  . Total hip arthroplasty  07-09-2005  DR ALUISIO    RIGHT HIP OA  . Left knee surgery  1993  (APPROX)  . Appendectomy  AGE 19  . Cataract extraction w/ intraocular lens implant  2013    RIGHT EYE  . Cystoscopy  02/13/2012    Procedure: CYSTOSCOPY FLEXIBLE;  Surgeon: Hanley Ben, MD;  Location: [facility];  Service: Urology;  Laterality: N/Albert;  . Prostatectomy N/Albert 08/03/2012    Procedure: SIMPLE RETROPUBIC PROSTATECTOMY, removal of left double J stent. ;  Surgeon: Hanley Ben, MD;  Location: [facility];  Service: Urology;  Laterality: N/Albert;  . Total knee arthroplasty Left 08/21/2015    Procedure: LEFT TOTAL KNEE ARTHROPLASTY;  Surgeon: Paralee Cancel, MD;  Location: [facility];  Service: Orthopedics;  Laterality: Left;    There were no vitals filed for this visit.      Subjective Assessment - 09/21/15 1458    Subjective Pt reports he had Albert deep ache in his Lt knee the night of last session,  "like I use to get when I would use wt machines prior to surgery".  Pt reports his lateral Lt knee swelled Albert little and he has had increased stiffness since that night.    Patient Stated Goals get back on the golf course - knee to move well and be strong    Currently in Pain? No/denies  just stiffness.             [facility] PT Assessment - 09/21/15 0001    Assessment   Medical Diagnosis Lt TKA   Referring Provider Dr. Adriana Mccallum    Onset Date/Surgical Date 08/21/15   Hand Dominance Right   Next MD Visit 10/03/15   Prior Therapy in the hospital           [facility] Adult PT Treatment/Exercise - 09/21/15 0001    Knee/Hip Exercises: Stretches   Quad Stretch 3 reps;Left;30 seconds   Hip Flexor Stretch Left;60 seconds  supine with foam under distal thigh. (very tight)   Press photographer Left;Right;30 seconds;4 reps   Knee/Hip Exercises: Aerobic   Stationary Bike 3 min of semi-circles; 5 min @ L1    Other Aerobic Laps around gym between exercises to decrease stiffness.    Knee/Hip Exercises: Standing   Wall Squat 10 reps  (to 45 deg knee flexion)   Wall  Squat Limitations (challenging)   SLS Lt SLS with occasional UE support x 15 sec x 4 trials (on blue foam pad)   Other Standing Knee Exercises Forward step up/ down, backward step up/down on [place] 4" step x 12 reps (with  LUE support, especially when stepping up backwards with LLE)   Other Standing Knee Exercises Lt TKE with green band and 5 sec hold x 10 reps   Moist Heat Therapy   Number Minutes Moist Heat 15 Minutes   Moist Heat Location Knee  posterior Lt   Cryotherapy   Number Minutes Cryotherapy 15 Minutes   Cryotherapy Location Knee  anterior Lt   Type of Cryotherapy Ice pack   Electrical Stimulation   Electrical Stimulation Location Lt knee    Electrical Stimulation Action ion repelling    Electrical Stimulation Parameters to tolerance    Electrical Stimulation Goals Edema;Pain   Kinesiotix   Edema Rock tape placed in web  pattern over ant portion of Lt knee.  small strip of cover-roll placed over inferior incision underneath Rock tape.  Piece placed over lateral portion of Lt knee to reduce edema.                   PT Short Term Goals - 09/19/15 1621    PT SHORT TERM GOAL #1   Title Instruct patient in appropriate gait pattern with rolling walker and step through gait pattern with equal step length 09/22/15   Time 4   Period Weeks   Status Achieved   PT SHORT TERM GOAL #2   Title Patient to transfer in and out of chair; bed; car indepently with good knee flexion 09/22/15    Time 4   Period Weeks   Status Achieved   PT SHORT TERM GOAL #3   Title Lt knee flexion to 90 degrees; extension (-) 2 degrees 10/05/15   Time 6   Period Weeks   Status Partially Met   PT SHORT TERM GOAL #4   Title Independent in initial HEP 10/05/15   Time 6   Period Weeks   Status Achieved           PT Long Term Goals - 09/19/15 1526    PT LONG TERM GOAL #1   Title 4+/5 to 5/5 strength Lt LE 11/15/15   Time 12   Period Weeks   Status On-going   PT LONG TERM GOAL #2   Title Lt knee flexion to 120 degrees, extension 0 degrees 11/15/15   Time 12   Period Weeks   Status On-going   PT LONG TERM GOAL #3   Title Independent in HEP for discharge with return to golf 11/15/15   Time 12   Period Weeks   Status On-going   PT LONG TERM GOAL #4   Title Independent in gait with least restrictive assistive device to no assistive device 11/15/15   Time 12   Period Weeks   Status On-going   PT LONG TERM GOAL #5   Title Improve FOTO to </= 43% limitation 11/15/15   Time 12   Period Weeks   Status On-going               Plan - 09/21/15 1531    Clinical Impression Statement Pt had some increased pain and stiffness in Lt knee after using resistance machine last visit.  Pt tolerated all exercises today, with minimal increase in pain. Noted some tightness in Lt hip flexor; added stretch to HEP.  Progressing towards  goals.    Rehab Potential Good   PT Frequency 2x / week   PT Duration 12 weeks   PT Treatment/Interventions Patient/family education;ADLs/Self Care Home Management;Cryotherapy;Electrical Stimulation;Iontophoresis 36m/ml Dexamethasone;Moist Heat;Ultrasound;Neuromuscular re-education;Gait training;Manual techniques;Dry needling;Therapeutic activities;Therapeutic exercise   PT Next Visit Plan Gcode/ FOTO (10th visit)   Consulted and Agree with Plan of Care Patient      Patient will benefit from skilled therapeutic intervention in order to improve the following deficits and impairments:  Postural dysfunction, Improper body mechanics, Pain, Decreased range of motion, Decreased mobility, Decreased strength, Abnormal gait, Decreased activity tolerance  Visit Diagnosis: No diagnosis found.     Problem List Patient Active Problem List   Diagnosis Date Noted  . Overweight (BMI 25.0-29.9) 08/22/2015  . S/P left TKA 08/21/2015  . Primary osteoarthritis of left knee 07/10/2015  . Prediabetes 06/22/2015  . Elevated fasting glucose 06/08/2015  . Primary osteoarthritis of both knees 01/18/2015  . Abnormal ECG 06/14/2014  . Nonspecific abnormal electrocardiogram (ECG) (EKG) 05/02/2014  . Colon polyp 04/17/2014  . Osteoarthritis of both shoulders 07/15/2013  . Osteoarthritis of both knees 12/01/2012  . BPH (benign prostatic hypertrophy) with urinary obstruction 07/03/2012  . History of gout 06/14/2012  . Diarrhea 05/11/2012  . Pyelonephritis 05/09/2012  . Hypokalemia 05/09/2012  . Sepsis due to urinary tract infection ([place] 05/09/2012  . Hyponatremia 05/09/2012  . Leukocytosis, unspecified 05/09/2012  . CKD (chronic kidney disease) stage 3, GFR 30-59 ml/min 03/23/2012  . Obstruction of kidney 02/23/2012  . Hyperlipidemia   . Hypertension    JKerin Perna PTA 09/21/2015 4:10 PM  [place] [address]Phone: [phone]  Fax:  [phone] Name: AHilding QuintanarMRN: [id number] of Birth: [birth year]

## 2015-09-27 ENCOUNTER — Ambulatory Visit (INDEPENDENT_AMBULATORY_CARE_PROVIDER_SITE_OTHER): Payer: Medicare Other | Admitting: Physical Therapy

## 2015-09-27 DIAGNOSIS — R531 Weakness: Secondary | ICD-10-CM | POA: Diagnosis not present

## 2015-09-27 DIAGNOSIS — M25562 Pain in left knee: Secondary | ICD-10-CM

## 2015-09-27 DIAGNOSIS — R29898 Other symptoms and signs involving the musculoskeletal system: Secondary | ICD-10-CM

## 2015-09-27 DIAGNOSIS — R2689 Other abnormalities of gait and mobility: Secondary | ICD-10-CM | POA: Diagnosis not present

## 2015-09-27 NOTE — Therapy (Signed)
[place], [place], [address] Phone: [phone]   Fax:  [phone]  Physical Therapy Treatment  Patient Details  Name: Albert Hicks MRN: [id number] Date of Birth: 03/23/1941 Referring Provider: Dr. Adriana Mccallum   Encounter Date: 09/27/2015      PT End of Session - 09/27/15 0815    Visit Number 10   Number of Visits 24   Date for PT Re-Evaluation 11/16/15   PT Start Time 0803   PT Stop Time 0901   PT Time Calculation (min) 58 min      Past Medical History  Diagnosis Date  . Hyperlipidemia   . Hypertension   . Gross hematuria   . BPH (benign prostatic hypertrophy)   . Elevated PSA   . OA (osteoarthritis) knees  . Hydronephrosis, left   . Acid reflux OCCASIONALLY TAKE TUMS  . Complication of anesthesia   . PONV (postoperative nausea and vomiting)   . Wears glasses   . Tinnitus     Past Surgical History  Procedure Laterality Date  . Total hip arthroplasty  07-09-2005  DR ALUISIO    RIGHT HIP OA  . Left knee surgery  1993  (APPROX)  . Appendectomy  AGE 75  . Cataract extraction w/ intraocular lens implant  2013    RIGHT EYE  . Cystoscopy  02/13/2012    Procedure: CYSTOSCOPY FLEXIBLE;  Surgeon: Hanley Ben, MD;  Location: [facility];  Service: Urology;  Laterality: N/A;  . Prostatectomy N/A 08/03/2012    Procedure: SIMPLE RETROPUBIC PROSTATECTOMY, removal of left double J stent. ;  Surgeon: Hanley Ben, MD;  Location: [facility];  Service: Urology;  Laterality: N/A;  . Total knee arthroplasty Left 08/21/2015    Procedure: LEFT TOTAL KNEE ARTHROPLASTY;  Surgeon: Paralee Cancel, MD;  Location: [facility];  Service: Orthopedics;  Laterality: Left;    There were no vitals filed for this visit.      Subjective Assessment - 09/27/15 0816    Subjective Albert Hicks feels he has a little more flexibility in Lt knee.  "I still have a phobia of going down stairs, though I am working on  it".  Pt reports he feels like he is walking better than he has in a while.  Complains of Lt Great toe being sore to the touch for about a wk.    Currently in Pain? No/denies  just stiffness            [facility] PT Assessment - 09/27/15 0001    Assessment   Medical Diagnosis Lt TKA   Referring Provider Dr. Adriana Mccallum    Onset Date/Surgical Date 08/21/15   Hand Dominance Right   Next MD Visit 10/03/15   Prior Therapy in the hospital    Observation/Other Assessments   Focus on Therapeutic Outcomes (FOTO)  46% limited; 43% goal.    AROM   Left Knee Extension -5   Left Knee Flexion 104  with seated scoot          [facility] Adult PT Treatment/Exercise - 09/27/15 0001    Knee/Hip Exercises: Stretches   Gastroc Stretch Left;Right;30 seconds;4 reps   Knee/Hip Exercises: Aerobic   Nustep L4: 6.5 min    Knee/Hip Exercises: Standing   Lateral Step Up Left;1 set;10 reps  onto Bosu, with light UE support   Forward Step Up Left;1 set;10 reps  onto Bosu, with light UE support   Wall Squat 10 reps  (to 45 deg knee  flexion)   Wall Squat Limitations (challenging)   SLS Lt/Rt SLS on Bosu x 30 sec with light UE support.  Repeated on mini tramp.     Other Standing Knee Exercises Walking backwards on treadmill x 2 min, focus on even step length and TKE.    Knee/Hip Exercises: Seated   Other Seated Knee/Hip Exercises seated knee flexion holding 20-30 sec x 8 reps  scooting forward - working on knee flexion    Sit to [employer] 5 reps;without UE support  very challenging without UE support   Modalities   Modalities Vasopneumatic;Electrical Stimulation   Acupuncturist Location Lt knee    Agricultural consultant Stimulation Parameters to tolerance    Electrical Stimulation Goals Edema;Pain   Vasopneumatic   Number Minutes Vasopneumatic  15 minutes   Vasopnuematic Location  Knee   Vasopneumatic Pressure Medium   Vasopneumatic  Temperature  3 snowflakes                   PT Short Term Goals - 09/19/15 1621    PT SHORT TERM GOAL #1   Title Instruct patient in appropriate gait pattern with rolling walker and step through gait pattern with equal step length 09/22/15   Time 4   Period Weeks   Status Achieved   PT SHORT TERM GOAL #2   Title Patient to transfer in and out of chair; bed; car indepently with good knee flexion 09/22/15    Time 4   Period Weeks   Status Achieved   PT SHORT TERM GOAL #3   Title Lt knee flexion to 90 degrees; extension (-) 2 degrees 10/05/15   Time 6   Period Weeks   Status Partially Met   PT SHORT TERM GOAL #4   Title Independent in initial HEP 10/05/15   Time 6   Period Weeks   Status Achieved           PT Long Term Goals - 09/27/15 1307    PT LONG TERM GOAL #1   Title 4+/5 to 5/5 strength Lt LE 11/15/15   Time 12   Period Weeks   Status On-going   PT LONG TERM GOAL #2   Title Lt knee flexion to 120 degrees, extension 0 degrees 11/15/15   Time 12   Period Weeks   Status On-going   PT LONG TERM GOAL #3   Title Independent in HEP for discharge with return to golf 11/15/15   Time 12   Period Weeks   Status On-going   PT LONG TERM GOAL #4   Title Independent in gait with least restrictive assistive device to no assistive device 11/15/15   Time 12   Period Weeks   Status Achieved   PT LONG TERM GOAL #5   Title Improve FOTO to </= 43% limitation 11/15/15   Time 12   Period Weeks   Status On-going               Plan - 09/27/15 1308    Clinical Impression Statement Albert Hicks is making steady progress with Lt knee ROM and strength. Pt is demonstrating decreased compensatory strategies for functional activities.  Pt scored 46% limited on FOTO knee survey; improved from 66% limited.  Pt tolerated all exercises well with minimal increase in pain; reduced with use of estim/ vaso at end of session.     Rehab Potential Good   PT Frequency 2x / week  PT Duration 12  weeks   PT Treatment/Interventions Patient/family education;ADLs/Self Care Home Management;Cryotherapy;Electrical Stimulation;Iontophoresis 104m/ml Dexamethasone;Moist Heat;Ultrasound;Neuromuscular re-education;Gait training;Manual techniques;Dry needling;Therapeutic activities;Therapeutic exercise   PT Next Visit Plan Continue progressive strengthening/ ROM for Lt knee.    Consulted and Agree with Plan of Care Patient      Patient will benefit from skilled therapeutic intervention in order to improve the following deficits and impairments:  Postural dysfunction, Improper body mechanics, Pain, Decreased range of motion, Decreased mobility, Decreased strength, Abnormal gait, Decreased activity tolerance  Visit Diagnosis: Pain in left knee  Weakness generalized  Other abnormalities of gait and mobility  Other symptoms and signs involving the musculoskeletal system     Problem List Patient Active Problem List   Diagnosis Date Noted  . Overweight (BMI 25.0-29.9) 08/22/2015  . S/P left TKA 08/21/2015  . Primary osteoarthritis of left knee 07/10/2015  . Prediabetes 06/22/2015  . Elevated fasting glucose 06/08/2015  . Primary osteoarthritis of both knees 01/18/2015  . Abnormal ECG 06/14/2014  . Nonspecific abnormal electrocardiogram (ECG) (EKG) 05/02/2014  . Colon polyp 04/17/2014  . Osteoarthritis of both shoulders 07/15/2013  . Osteoarthritis of both knees 12/01/2012  . BPH (benign prostatic hypertrophy) with urinary obstruction 07/03/2012  . History of gout 06/14/2012  . Diarrhea 05/11/2012  . Pyelonephritis 05/09/2012  . Hypokalemia 05/09/2012  . Sepsis due to urinary tract infection ([place] 05/09/2012  . Hyponatremia 05/09/2012  . Leukocytosis, unspecified 05/09/2012  . CKD (chronic kidney disease) stage 3, GFR 30-59 ml/min 03/23/2012  . Obstruction of kidney 02/23/2012  . Hyperlipidemia   . Hypertension    JKerin Perna PTA 09/27/2015 1:12 PM  [facility] [address]Phone: [phone]  Fax:  [phone] Name: ADhanvin SzetoMRN: [id number] of Birth: [birth year]

## 2015-09-28 ENCOUNTER — Ambulatory Visit (INDEPENDENT_AMBULATORY_CARE_PROVIDER_SITE_OTHER): Payer: Medicare Other | Admitting: Physical Therapy

## 2015-09-28 DIAGNOSIS — R531 Weakness: Secondary | ICD-10-CM | POA: Diagnosis not present

## 2015-09-28 DIAGNOSIS — M25562 Pain in left knee: Secondary | ICD-10-CM | POA: Diagnosis present

## 2015-09-28 DIAGNOSIS — R29898 Other symptoms and signs involving the musculoskeletal system: Secondary | ICD-10-CM

## 2015-09-28 DIAGNOSIS — R2689 Other abnormalities of gait and mobility: Secondary | ICD-10-CM | POA: Diagnosis not present

## 2015-09-28 NOTE — Therapy (Addendum)
[place], [place], [address] Phone: [phone]   Fax:  [phone]  Physical Therapy Treatment  Patient Details  Name: Albert Hicks MRN: [id number] Date of Birth: [birth year] Referring Provider: Dr. Adriana Mccallum  Encounter Date: 09/28/2015      PT End of Session - 09/28/15 1359    Visit Number 11   Number of Visits 24   Date for PT Re-Evaluation 11/16/15   PT Start Time 1332   PT Stop Time 1422   PT Time Calculation (min) 50 min   Activity Tolerance Patient tolerated treatment well      Past Medical History  Diagnosis Date  . Hyperlipidemia   . Hypertension   . Gross hematuria   . BPH (benign prostatic hypertrophy)   . Elevated PSA   . OA (osteoarthritis) knees  . Hydronephrosis, left   . Acid reflux OCCASIONALLY TAKE TUMS  . Complication of anesthesia   . PONV (postoperative nausea and vomiting)   . Wears glasses   . Tinnitus     Past Surgical History  Procedure Laterality Date  . Total hip arthroplasty  07-09-2005  DR ALUISIO    RIGHT HIP OA  . Left knee surgery  1993  (APPROX)  . Appendectomy  AGE 88  . Cataract extraction w/ intraocular lens implant  2013    RIGHT EYE  . Cystoscopy  02/13/2012    Procedure: CYSTOSCOPY FLEXIBLE;  Surgeon: Hanley Ben, MD;  Location: [facility];  Service: Urology;  Laterality: N/A;  . Prostatectomy N/A 08/03/2012    Procedure: SIMPLE RETROPUBIC PROSTATECTOMY, removal of left double J stent. ;  Surgeon: Hanley Ben, MD;  Location: [facility];  Service: Urology;  Laterality: N/A;  . Total knee arthroplasty Left 08/21/2015    Procedure: LEFT TOTAL KNEE ARTHROPLASTY;  Surgeon: Paralee Cancel, MD;  Location: [facility];  Service: Orthopedics;  Laterality: Left;    There were no vitals filed for this visit.      Subjective Assessment - 09/28/15 1400    Subjective Pt reports no new changes since visit yesterday.    Currently in Pain?  No/denies  just stiffness            [facility] PT Assessment - 09/28/15 0001    Assessment   Medical Diagnosis Lt TKA   Referring Provider Dr. Adriana Mccallum   Onset Date/Surgical Date 08/21/15   Hand Dominance Right   Next MD Visit 10/03/15   Prior Therapy in the hospital            [facility] Adult PT Treatment/Exercise - 09/28/15 0001    Ambulation/Gait   Ambulation/Gait Yes   Ambulation/Gait Assistance 7: Independent   Ambulation Distance (Feet) --  ~300   Assistive device None   Gait Pattern Step-through pattern;Decreased arm swing - right;Decreased arm swing - left;Decreased step length - right;Decreased stance time - left;Decreased weight shift to left   Stairs Yes   Stairs Assistance 5: Supervision   Stairs Assistance Details (indicate cue type and reason) slow and controlled descent.    Stair Management Technique Alternating pattern;One rail Right;Forwards   Number of Stairs 25   Height of Stairs 6   Gait Comments VC for increased toe off and increased step length on Rt ).  Pt began rotating trunk to compensate for tight Lt hip flexor.     Knee/Hip Exercises: Stretches   Hip Flexor Stretch Left;3 reps;60 seconds   Gastroc Stretch Right;Left;2 reps;30 seconds  Knee/Hip Exercises: Standing   Heel Raises 2 sets   Lateral Step Up Left;1 set;10 reps;Step Height: 8";Hand Hold: 1   Forward Step Up Left;1 set;10 reps;Hand Hold: 2;Step Height: 8"   Functional Squat 1 set;10 reps  holding onto counter   Other Standing Knee Exercises Forward lunge on 13" step to increase Lt knee ROM followed by hamstring stretch x 10 sec each direction x 10 reps    Modalities   Modalities Electrical Stimulation;Vasopneumatic   Electrical Stimulation   Electrical Stimulation Location Lt knee    Electrical Stimulation Action ion repelling   Electrical Stimulation Parameters to tolerance    Electrical Stimulation Goals Edema;Pain   Vasopneumatic   Number Minutes Vasopneumatic  15 minutes    Vasopnuematic Location  Knee   Vasopneumatic Pressure Medium   Vasopneumatic Temperature  3 snowflakes    Manual Therapy   Kinesiotex Edema   Kinesiotix   Edema Rock tape placed in web pattern over ant portion of Lt knee.  small strip of cover-roll placed over inferior incision underneath Rock tape.  Piece placed over lateral portion of Lt knee to reduce edema.                 PT Education - 09/28/15 1652    Education provided Yes   Education Details HEP- added hip flexor stretch    Person(s) Educated Patient   Methods Explanation;Handout   Comprehension Verbalized understanding;Returned demonstration          PT Short Term Goals - 09/19/15 1621    PT SHORT TERM GOAL #1   Title Instruct patient in appropriate gait pattern with rolling walker and step through gait pattern with equal step length 09/22/15   Time 4   Period Weeks   Status Achieved   PT SHORT TERM GOAL #2   Title Patient to transfer in and out of chair; bed; car indepently with good knee flexion 09/22/15    Time 4   Period Weeks   Status Achieved   PT SHORT TERM GOAL #3   Title Lt knee flexion to 90 degrees; extension (-) 2 degrees 10/05/15   Time 6   Period Weeks   Status Partially Met   PT SHORT TERM GOAL #4   Title Independent in initial HEP 10/05/15   Time 6   Period Weeks   Status Achieved           PT Long Term Goals - 09/27/15 1307    PT LONG TERM GOAL #1   Title 4+/5 to 5/5 strength Lt LE 11/15/15   Time 12   Period Weeks   Status On-going   PT LONG TERM GOAL #2   Title Lt knee flexion to 120 degrees, extension 0 degrees 11/15/15   Time 12   Period Weeks   Status On-going   PT LONG TERM GOAL #3   Title Independent in HEP for discharge with return to golf 11/15/15   Time 12   Period Weeks   Status On-going   PT LONG TERM GOAL #4   Title Independent in gait with least restrictive assistive device to no assistive device 11/15/15   Time 12   Period Weeks   Status Achieved   PT LONG  TERM GOAL #5   Title Improve FOTO to </= 43% limitation 11/15/15   Time 12   Period Weeks   Status On-going               Plan - 09/27/15 1308  Clinical Impression Statement Rowin is making steady progress with Lt knee ROM and strength. Pt is demonstrating decreased compensatory strategies for functional activities.  Pt scored 46% limited on FOTO knee survey; improved from 66% limited.  Pt tolerated all exercises well with minimal increase in pain; reduced with use of estim/ vaso at end of session.     Rehab Potential Good   PT Frequency 2x / week   PT Duration 12 weeks   PT Treatment/Interventions Patient/family education;ADLs/Self Care Home Management;Cryotherapy;Electrical Stimulation;Iontophoresis 24m/ml Dexamethasone;Moist Heat;Ultrasound;Neuromuscular re-education;Gait training;Manual techniques;Dry needling;Therapeutic activities;Therapeutic exercise   PT Next Visit Plan Continue progressive strengthening/ ROM for Lt knee.    Consulted and Agree with Plan of Care Patient      Patient will benefit from skilled therapeutic intervention in order to improve the following deficits and impairments:     Visit Diagnosis: Pain in left knee  Weakness generalized  Other abnormalities of gait and mobility  Other symptoms and signs involving the musculoskeletal system     Problem List Patient Active Problem List   Diagnosis Date Noted  . Overweight (BMI 25.0-29.9) 08/22/2015  . S/P left TKA 08/21/2015  . Primary osteoarthritis of left knee 07/10/2015  . Prediabetes 06/22/2015  . Elevated fasting glucose 06/08/2015  . Primary osteoarthritis of both knees 01/18/2015  . Abnormal ECG 06/14/2014  . Nonspecific abnormal electrocardiogram (ECG) (EKG) 05/02/2014  . Colon polyp 04/17/2014  . Osteoarthritis of both shoulders 07/15/2013  . Osteoarthritis of both knees 12/01/2012  . BPH (benign prostatic hypertrophy) with urinary obstruction 07/03/2012  . History of gout  06/14/2012  . Diarrhea 05/11/2012  . Pyelonephritis 05/09/2012  . Hypokalemia 05/09/2012  . Sepsis due to urinary tract infection [place] 05/09/2012  . Hyponatremia 05/09/2012  . Leukocytosis, unspecified 05/09/2012  . CKD (chronic kidney disease) stage 3, GFR 30-59 ml/min 03/23/2012  . Obstruction of kidney 02/23/2012  . Hyperlipidemia   . Hypertension    JKerin Perna PTA 09/28/2015 4:53 PM  [facility] [place] [address] [address]Phone: [phone]  Fax:  [phone] Name: APartick MusselmanMRN: [id number] of Birth: [birth year]   PHYSICAL THERAPY DISCHARGE SUMMARY  Visits from Start of Care: 11  Current functional level related to goals / functional outcomes: Progressing well with knee rehab. See last progress note for discharge status.   Remaining deficits: Should continue with HEP to increase strength and ROM    Education / Equipment: HEP Plan: Patient agrees to discharge.  Patient goals were partially met. Patient is being discharged due to being pleased with the current functional level.  ?????     Celyn P. HHelene KelpPT, MPH 02/18/16 7:46 AM

## 2015-09-28 NOTE — Patient Instructions (Signed)
Hip Flexor Stretch    Lying on back near edge of bed, bend one leg, foot flat. Hang other leg over edge, relaxed, thigh resting entirely on bed for 30 seconds to 1__ minutes. Repeat _3___ times. Do _1-2___ sessions per day. Advanced Exercise: Bend knee back keeping thigh in contact with bed.  [facility] [address] [place], [place] [address]  [phone] (office) [phone] (fax)

## 2015-10-03 DIAGNOSIS — Z471 Aftercare following joint replacement surgery: Secondary | ICD-10-CM | POA: Diagnosis not present

## 2015-10-03 DIAGNOSIS — Z96652 Presence of left artificial knee joint: Secondary | ICD-10-CM | POA: Diagnosis not present

## 2015-10-24 DIAGNOSIS — Z96652 Presence of left artificial knee joint: Secondary | ICD-10-CM | POA: Diagnosis not present

## 2015-10-24 DIAGNOSIS — Z471 Aftercare following joint replacement surgery: Secondary | ICD-10-CM | POA: Diagnosis not present

## 2015-10-24 DIAGNOSIS — M25552 Pain in left hip: Secondary | ICD-10-CM | POA: Diagnosis not present

## 2015-11-08 ENCOUNTER — Other Ambulatory Visit: Payer: Self-pay | Admitting: Physician Assistant

## 2015-12-19 DIAGNOSIS — M25552 Pain in left hip: Secondary | ICD-10-CM | POA: Diagnosis not present

## 2015-12-19 DIAGNOSIS — Z471 Aftercare following joint replacement surgery: Secondary | ICD-10-CM | POA: Diagnosis not present

## 2015-12-19 DIAGNOSIS — Z96652 Presence of left artificial knee joint: Secondary | ICD-10-CM | POA: Diagnosis not present

## 2015-12-19 DIAGNOSIS — M1612 Unilateral primary osteoarthritis, left hip: Secondary | ICD-10-CM | POA: Diagnosis not present

## 2016-01-22 DIAGNOSIS — H2512 Age-related nuclear cataract, left eye: Secondary | ICD-10-CM | POA: Diagnosis not present

## 2016-02-04 DIAGNOSIS — E785 Hyperlipidemia, unspecified: Secondary | ICD-10-CM | POA: Diagnosis not present

## 2016-02-04 DIAGNOSIS — Z79899 Other long term (current) drug therapy: Secondary | ICD-10-CM | POA: Diagnosis not present

## 2016-02-04 DIAGNOSIS — I10 Essential (primary) hypertension: Secondary | ICD-10-CM | POA: Diagnosis not present

## 2016-02-04 DIAGNOSIS — H2512 Age-related nuclear cataract, left eye: Secondary | ICD-10-CM | POA: Diagnosis not present

## 2016-02-28 ENCOUNTER — Other Ambulatory Visit: Payer: Self-pay

## 2016-03-26 ENCOUNTER — Encounter (HOSPITAL_COMMUNITY): Payer: Medicare Other

## 2016-04-01 ENCOUNTER — Inpatient Hospital Stay: Admit: 2016-04-01 | Payer: Medicare Other | Admitting: Orthopedic Surgery

## 2016-04-01 SURGERY — ARTHROPLASTY, HIP, TOTAL, ANTERIOR APPROACH
Anesthesia: Spinal | Site: Hip | Laterality: Left

## 2016-06-13 ENCOUNTER — Other Ambulatory Visit: Payer: Self-pay | Admitting: *Deleted

## 2016-06-13 MED ORDER — COLCHICINE 0.6 MG PO TABS: 0.6000 mg | ORAL_TABLET | Freq: Every day | ORAL | 0 refills | Status: DC

## 2016-07-02 DIAGNOSIS — M1711 Unilateral primary osteoarthritis, right knee: Secondary | ICD-10-CM | POA: Diagnosis not present

## 2016-07-02 DIAGNOSIS — M1612 Unilateral primary osteoarthritis, left hip: Secondary | ICD-10-CM | POA: Diagnosis not present

## 2016-07-03 ENCOUNTER — Other Ambulatory Visit: Payer: Self-pay | Admitting: Physician Assistant

## 2016-07-11 DIAGNOSIS — M1711 Unilateral primary osteoarthritis, right knee: Secondary | ICD-10-CM | POA: Diagnosis not present

## 2016-07-11 DIAGNOSIS — M1612 Unilateral primary osteoarthritis, left hip: Secondary | ICD-10-CM | POA: Diagnosis not present

## 2016-07-16 ENCOUNTER — Ambulatory Visit (INDEPENDENT_AMBULATORY_CARE_PROVIDER_SITE_OTHER): Payer: Medicare Other | Admitting: Physician Assistant

## 2016-07-16 ENCOUNTER — Encounter: Payer: Self-pay | Admitting: Physician Assistant

## 2016-07-16 VITALS — BP 128/70 | HR 91 | Ht 73.0 in | Wt 204.0 lb

## 2016-07-16 DIAGNOSIS — M169 Osteoarthritis of hip, unspecified: Secondary | ICD-10-CM | POA: Insufficient documentation

## 2016-07-16 DIAGNOSIS — Z01818 Encounter for other preprocedural examination: Secondary | ICD-10-CM | POA: Diagnosis not present

## 2016-07-16 DIAGNOSIS — M6250 Muscle wasting and atrophy, not elsewhere classified, unspecified site: Secondary | ICD-10-CM | POA: Diagnosis not present

## 2016-07-16 DIAGNOSIS — M1612 Unilateral primary osteoarthritis, left hip: Secondary | ICD-10-CM | POA: Diagnosis not present

## 2016-07-16 DIAGNOSIS — M10061 Idiopathic gout, right knee: Secondary | ICD-10-CM | POA: Diagnosis not present

## 2016-07-16 NOTE — Progress Notes (Signed)
   Subjective:    Patient ID: Albert Hicks, male    DOB: January 23, 1941, 76 y.o.   MRN: [id number]  HPI Pt is a 76 yo male who presents to the clinic for presurgical clearance for a  Left Hip: THA w/wo autograft/allograft scheduled for 09/17/2016.   Pt did have episode of gout in right knee where ortho drained off fluid twice off and knee and confirmed gout. He would like to consider something to prevent.   He is concerned about losing muscle mass. He works out regularly. He wants to make sure testosterone is good. He denies any depression or fatigue.    Review of Systems  All other systems reviewed and are negative.      Objective:   Physical Exam  Constitutional: He is oriented to person, place, and time. He appears well-developed and well-nourished.  HENT:  Head: Normocephalic and atraumatic.  Cardiovascular: Normal rate, regular rhythm and normal heart sounds.   Pulmonary/Chest: Effort normal and breath sounds normal.  Neurological: He is alert and oriented to person, place, and time.  Psychiatric: He has a normal mood and affect. His behavior is normal.          Assessment & Plan:  [name] KitchenMarland KitchenDiagnoses and all orders for this visit:  Preop examination -     CBC -     Basic metabolic panel  Primary osteoarthritis of left hip  Acute idiopathic gout of right knee  Muscular atrophy, unspecified site -     Testosterone   Will complete paperwork after labs received.  EKG done within the year and within normal range.  Will check testosterone although there are no other concerning symptoms.  After kidney function received will start therapy for gout prevention with allopurinol.

## 2016-07-17 ENCOUNTER — Encounter: Payer: Self-pay | Admitting: Physician Assistant

## 2016-07-17 DIAGNOSIS — D649 Anemia, unspecified: Secondary | ICD-10-CM | POA: Insufficient documentation

## 2016-07-17 DIAGNOSIS — R7989 Other specified abnormal findings of blood chemistry: Secondary | ICD-10-CM | POA: Insufficient documentation

## 2016-07-17 LAB — CBC
HCT: 37.6 % — ABNORMAL LOW (ref 38.5–50.0)
Hemoglobin: 12.9 g/dL — ABNORMAL LOW (ref 13.2–17.1)
MCH: 33 pg (ref 27.0–33.0)
MCHC: 34.3 g/dL (ref 32.0–36.0)
MCV: 96.2 fL (ref 80.0–100.0)
MPV: 9.8 fL (ref 7.5–12.5)
Platelets: 416 10*3/uL — ABNORMAL HIGH (ref 140–400)
RBC: 3.91 MIL/uL — ABNORMAL LOW (ref 4.20–5.80)
RDW: 13.8 % (ref 11.0–15.0)
WBC: 7.8 10*3/uL (ref 3.8–10.8)

## 2016-07-17 LAB — BASIC METABOLIC PANEL
BUN: 19 mg/dL (ref 7–25)
CO2: 20 mmol/L (ref 20–31)
Calcium: 8.8 mg/dL (ref 8.6–10.3)
Chloride: 106 mmol/L (ref 98–110)
Creat: 1.11 mg/dL (ref 0.70–1.18)
Glucose, Bld: 99 mg/dL (ref 65–99)
Potassium: 4.3 mmol/L (ref 3.5–5.3)
Sodium: 138 mmol/L (ref 135–146)

## 2016-07-17 LAB — TESTOSTERONE: Testosterone: 240 ng/dL — ABNORMAL LOW (ref 250–827)

## 2016-07-17 NOTE — Progress Notes (Signed)
Call pt:  Please make copy of labs and send with past EKG to surgeon.   Just slightly anemic. Start ferrous sulfate 325mg  once a day at least through surgery.   Testosterone is just a hair low. We have to have 2 lows to consider treatment. Come in 2 weeks between 8am and 10am to get another testosterone checked. (at that time he will also need PSA in labs).

## 2016-07-25 ENCOUNTER — Ambulatory Visit (INDEPENDENT_AMBULATORY_CARE_PROVIDER_SITE_OTHER): Payer: Medicare Other | Admitting: Physician Assistant

## 2016-07-25 ENCOUNTER — Encounter: Payer: Self-pay | Admitting: Physician Assistant

## 2016-07-25 VITALS — BP 120/74 | HR 100 | Ht 73.0 in | Wt 201.0 lb

## 2016-07-25 DIAGNOSIS — M10061 Idiopathic gout, right knee: Secondary | ICD-10-CM

## 2016-07-25 DIAGNOSIS — R195 Other fecal abnormalities: Secondary | ICD-10-CM

## 2016-07-25 DIAGNOSIS — Z8739 Personal history of other diseases of the musculoskeletal system and connective tissue: Secondary | ICD-10-CM | POA: Diagnosis not present

## 2016-07-25 DIAGNOSIS — M6250 Muscle wasting and atrophy, not elsewhere classified, unspecified site: Secondary | ICD-10-CM | POA: Diagnosis not present

## 2016-07-25 NOTE — Progress Notes (Signed)
   Subjective:    Patient ID: Albert Hicks, male    DOB: 11-19-1940, 76 y.o.   MRN: [id number]  HPI  Pt is a 76 yo male who presents to the clinic with a few concerns.   He noticed over last week his stools changed colors from brown to very dark. He has started OTC iron after our last visit. His last colonoscopy was 2 years ago and polyps were found but he is supposed to follow up every 3 years. Denies any abdominal pain/acid reflux.  He has lost 11lbs since last year. He admits he has not been exercising and weight lifting like he used to. He does have surgery on his left hip coming up. He denies any fever, chills, night sweats. He does feel like energy is decreased.   He is concerned something bad might be going on and wants to get checked out.  Review of Systems  All other systems reviewed and are negative.      Objective:   Physical Exam  Constitutional: He is oriented to person, place, and time. He appears well-developed and well-nourished.  HENT:  Head: Normocephalic and atraumatic.  Cardiovascular: Normal rate, regular rhythm and normal heart sounds.   Pulmonary/Chest: Effort normal and breath sounds normal.  Abdominal: Soft. Bowel sounds are normal. He exhibits no distension and no mass. There is no tenderness. There is no rebound and no guarding.  Neurological: He is alert and oriented to person, place, and time.  Psychiatric: He has a normal mood and affect. His behavior is normal.          Assessment & Plan:  [name] KitchenMarland KitchenJohnathin was seen today for weight loss.  Diagnoses and all orders for this visit:  Muscular atrophy, unspecified site  Dark stools  History of gout -     colchicine 0.6 MG tablet; Take 1 tablet (0.6 mg total) by mouth daily. -     allopurinol (ZYLOPRIM) 100 MG tablet; Take 1 tablet (100 mg total) by mouth daily.  Acute idiopathic gout of right knee -     colchicine 0.6 MG tablet; Take 1 tablet (0.6 mg total) by mouth daily. -     allopurinol (ZYLOPRIM)  100 MG tablet; Take 1 tablet (100 mg total) by mouth daily.   Discussed dark stools likely due to iron replacement. Stop iron and see if stools return to normal. If do not let me know and will send for colonoscopy due to polyp hx.   Weight loss is likely due to some muscle atrophy due to age and not working out as much. If weight gain continues to decline rapidly can work up more.   Medication sent in for gout prevention.

## 2016-07-25 NOTE — Patient Instructions (Signed)
Tumeric 500mg  twice a day.

## 2016-07-27 MED ORDER — COLCHICINE 0.6 MG PO TABS: 0.6000 mg | ORAL_TABLET | Freq: Every day | ORAL | 0 refills | Status: DC

## 2016-07-27 MED ORDER — ALLOPURINOL 100 MG PO TABS: 100.0000 mg | ORAL_TABLET | Freq: Every day | ORAL | 5 refills | Status: DC

## 2016-08-04 ENCOUNTER — Other Ambulatory Visit: Payer: Self-pay | Admitting: *Deleted

## 2016-08-04 MED ORDER — AMLODIPINE BESYLATE 10 MG PO TABS: 10.0000 mg | ORAL_TABLET | Freq: Every day | ORAL | 5 refills | Status: DC

## 2016-08-12 ENCOUNTER — Ambulatory Visit: Payer: Self-pay | Admitting: Orthopedic Surgery

## 2016-08-22 ENCOUNTER — Encounter (HOSPITAL_COMMUNITY): Payer: Self-pay | Admitting: *Deleted

## 2016-09-11 NOTE — Patient Instructions (Addendum)
Albert Hicks  09/11/2016   Your procedure is scheduled on: 09-17-16  Report to [facility] Main  Entrance, Report to Admitting at 10:00 AM.    Call this number if you have problems the morning of surgery  [phone]   Remember: ONLY 1 PERSON MAY GO WITH YOU TO SHORT STAY TO GET  READY MORNING OF [place].  Do not eat food or drink liquids :After Midnight.     Take these medicines the morning of surgery with A SIP OF WATER: Amlodipine (Norvasc)                                   Men may shave face and neck.   Do not bring valuables to the hospital. [place].  Contacts, dentures or bridgework may not be worn into surgery.  Leave suitcase in the car. After surgery it may be brought to your room.     Please read over the following fact sheets you were given: _____________________________________________________________________             [facility] - Preparing for Surgery Before surgery, you can play an important role.  Because skin is not sterile, your skin needs to be as free of germs as possible.  You can reduce the number of germs on your skin by washing with CHG (chlorahexidine gluconate) soap before surgery.  CHG is an antiseptic cleaner which kills germs and bonds with the skin to continue killing germs even after washing. Please DO NOT use if you have an allergy to CHG or antibacterial soaps.  If your skin becomes reddened/irritated stop using the CHG and inform your nurse when you arrive at Short Stay. Do not shave (including legs and underarms) for at least 48 hours prior to the first CHG shower.  You may shave your face/neck. Please follow these instructions carefully:  1.  Shower with CHG Soap the night before surgery and the  morning of Surgery.  2.  If you choose to wash your hair, wash your hair first as usual with your  normal  shampoo.  3.  After you shampoo, rinse your hair and body  thoroughly to remove the  shampoo.                           4.  Use CHG as you would any other liquid soap.  You can apply chg directly  to the skin and wash                       Gently with a scrungie or clean washcloth.  5.  Apply the CHG Soap to your body ONLY FROM THE NECK DOWN.   Do not use on face/ open                           Wound or open sores. Avoid contact with eyes, ears mouth and genitals (private parts).                       Wash face,  Genitals (private parts) with your normal soap.  6.  Wash thoroughly, paying special attention to the area where your surgery  will be performed.  7.  Thoroughly rinse your body with warm water from the neck down.  8.  DO NOT shower/wash with your normal soap after using and rinsing off  the CHG Soap.                9.  Pat yourself dry with a clean towel.            10.  Wear clean pajamas.            11.  Place clean sheets on your bed the night of your first shower and do not  sleep with pets. Day of Surgery : Do not apply any lotions/deodorants the morning of surgery.  Please wear clean clothes to the hospital/surgery center.  FAILURE TO FOLLOW THESE INSTRUCTIONS MAY RESULT IN THE CANCELLATION OF YOUR SURGERY PATIENT SIGNATURE_________________________________  NURSE SIGNATURE__________________________________  ________________________________________________________________________   [name]  An incentive spirometer is a tool that can help keep your lungs clear and active. This tool measures how well you are filling your lungs with each breath. Taking long deep breaths may help reverse or decrease the chance of developing breathing (pulmonary) problems (especially infection) following:  A long period of time when you are unable to move or be active. BEFORE THE PROCEDURE   If the spirometer includes an indicator to show your best effort, your nurse or respiratory therapist will set it to a desired goal.  If  possible, sit up straight or lean slightly forward. Try not to slouch.  Hold the incentive spirometer in an upright position. INSTRUCTIONS FOR USE  1. Sit on the edge of your bed if possible, or sit up as far as you can in bed or on a chair. 2. Hold the incentive spirometer in an upright position. 3. Breathe out normally. 4. Place the mouthpiece in your mouth and seal your lips tightly around it. 5. Breathe in slowly and as deeply as possible, raising the piston or the ball toward the top of the column. 6. Hold your breath for 3-5 seconds or for as long as possible. Allow the piston or ball to fall to the bottom of the column. 7. Remove the mouthpiece from your mouth and breathe out normally. 8. Rest for a few seconds and repeat Steps 1 through 7 at least 10 times every 1-2 hours when you are awake. Take your time and take a few normal breaths between deep breaths. 9. The spirometer may include an indicator to show your best effort. Use the indicator as a goal to work toward during each repetition. 10. After each set of 10 deep breaths, practice coughing to be sure your lungs are clear. If you have an incision (the cut made at the time of surgery), support your incision when coughing by placing a pillow or rolled up towels firmly against it. Once you are able to get out of bed, walk around indoors and cough well. You may stop using the incentive spirometer when instructed by your caregiver.  RISKS AND COMPLICATIONS  Take your time so you do not get dizzy or light-headed.  If you are in pain, you may need to take or ask for pain medication before doing incentive spirometry. It is harder to take a deep breath if you are having pain. AFTER USE  Rest and breathe slowly and easily.  It can be helpful to keep track of a log of your  progress. Your caregiver can provide you with a simple table to help with this. If you are using the spirometer at home, follow these instructions: [place]  IF:   You are having difficultly using the spirometer.  You have trouble using the spirometer as often as instructed.  Your pain medication is not giving enough relief while using the spirometer.  You develop fever of 100.5 F (38.1 C) or higher. SEEK IMMEDIATE MEDICAL CARE IF:   You cough up bloody sputum that had not been present before.  You develop fever of 102 F (38.9 C) or greater.  You develop worsening pain at or near the incision site. MAKE SURE YOU:   Understand these instructions.  Will watch your condition.  Will get help right away if you are not doing well or get worse. Document Released: 07/21/2006 Document Revised: 06/02/2011 Document Reviewed: 09/21/2006 ExitCare Patient Information 2014 ExitCare, [place].   ________________________________________________________________________  WHAT IS A BLOOD TRANSFUSION? Blood Transfusion Information  A transfusion is the replacement of blood or some of its parts. Blood is made up of multiple cells which provide different functions.  Red blood cells carry oxygen and are used for blood loss replacement.  White blood cells fight against infection.  Platelets control bleeding.  Plasma helps clot blood.  Other blood products are available for specialized needs, such as hemophilia or other clotting disorders. BEFORE THE TRANSFUSION  Who gives blood for transfusions?   Healthy volunteers who are fully evaluated to make sure their blood is safe. This is blood bank blood. Transfusion therapy is the safest it has ever been in the practice of medicine. Before blood is taken from a donor, a complete history is taken to make sure that person has no history of diseases nor engages in risky social behavior (examples are intravenous drug use or sexual activity with multiple partners). The donor's travel history is screened to minimize risk of transmitting infections, such as malaria. The donated blood is tested for signs of  infectious diseases, such as HIV and hepatitis. The blood is then tested to be sure it is compatible with you in order to minimize the chance of a transfusion reaction. If you or a relative donates blood, this is often done in anticipation of surgery and is not appropriate for emergency situations. It takes many days to process the donated blood. RISKS AND COMPLICATIONS Although transfusion therapy is very safe and saves many lives, the main dangers of transfusion include:   Getting an infectious disease.  Developing a transfusion reaction. This is an allergic reaction to something in the blood you were given. Every precaution is taken to prevent this. The decision to have a blood transfusion has been considered carefully by your caregiver before blood is given. Blood is not given unless the benefits outweigh the risks. AFTER THE TRANSFUSION  Right after receiving a blood transfusion, you will usually feel much better and more energetic. This is especially true if your red blood cells have gotten low (anemic). The transfusion raises the level of the red blood cells which carry oxygen, and this usually causes an energy increase.  The nurse administering the transfusion will monitor you carefully for complications. HOME CARE INSTRUCTIONS  No special instructions are needed after a transfusion. You may find your energy is better. Speak with your caregiver about any limitations on activity for underlying diseases you may have. SEEK MEDICAL CARE IF:   Your condition is not improving after your transfusion.  You develop redness or  irritation at the intravenous (IV) site. SEEK IMMEDIATE MEDICAL CARE IF:  Any of the following symptoms occur over the next 12 hours:  Shaking chills.  You have a temperature by mouth above 102 F (38.9 C), not controlled by medicine.  Chest, back, or muscle pain.  People around you feel you are not acting correctly or are confused.  Shortness of breath or  difficulty breathing.  Dizziness and fainting.  You get a rash or develop hives.  You have a decrease in urine output.  Your urine turns a dark color or changes to pink, red, or brown. Any of the following symptoms occur over the next 10 days:  You have a temperature by mouth above 102 F (38.9 C), not controlled by medicine.  Shortness of breath.  Weakness after normal activity.  The white part of the eye turns yellow (jaundice).  You have a decrease in the amount of urine or are urinating less often.  Your urine turns a dark color or changes to pink, red, or brown. Document Released: 03/07/2000 Document Revised: 06/02/2011 Document Reviewed: 10/25/2007 [facility] Patient Information 2014 [place], [place].  _______________________________________________________________________

## 2016-09-12 ENCOUNTER — Encounter (HOSPITAL_COMMUNITY)
Admission: RE | Admit: 2016-09-12 | Discharge: 2016-09-12 | Disposition: A | Discharge status: Home / Self Care | Payer: Medicare Other | Source: Ambulatory Visit | Attending: Orthopedic Surgery | Admitting: Orthopedic Surgery

## 2016-09-12 ENCOUNTER — Encounter (HOSPITAL_COMMUNITY): Payer: Self-pay

## 2016-09-12 DIAGNOSIS — N2889 Other specified disorders of kidney and ureter: Secondary | ICD-10-CM | POA: Diagnosis not present

## 2016-09-12 DIAGNOSIS — Z01812 Encounter for preprocedural laboratory examination: Secondary | ICD-10-CM | POA: Diagnosis not present

## 2016-09-12 DIAGNOSIS — M1612 Unilateral primary osteoarthritis, left hip: Secondary | ICD-10-CM | POA: Insufficient documentation

## 2016-09-12 DIAGNOSIS — N138 Other obstructive and reflux uropathy: Secondary | ICD-10-CM | POA: Diagnosis not present

## 2016-09-12 DIAGNOSIS — I444 Left anterior fascicular block: Secondary | ICD-10-CM | POA: Diagnosis not present

## 2016-09-12 DIAGNOSIS — N401 Enlarged prostate with lower urinary tract symptoms: Secondary | ICD-10-CM | POA: Insufficient documentation

## 2016-09-12 DIAGNOSIS — I129 Hypertensive chronic kidney disease with stage 1 through stage 4 chronic kidney disease, or unspecified chronic kidney disease: Secondary | ICD-10-CM | POA: Diagnosis not present

## 2016-09-12 DIAGNOSIS — E785 Hyperlipidemia, unspecified: Secondary | ICD-10-CM | POA: Insufficient documentation

## 2016-09-12 DIAGNOSIS — Z01810 Encounter for preprocedural cardiovascular examination: Secondary | ICD-10-CM | POA: Insufficient documentation

## 2016-09-12 DIAGNOSIS — E1122 Type 2 diabetes mellitus with diabetic chronic kidney disease: Secondary | ICD-10-CM | POA: Insufficient documentation

## 2016-09-12 DIAGNOSIS — E876 Hypokalemia: Secondary | ICD-10-CM | POA: Insufficient documentation

## 2016-09-12 DIAGNOSIS — N12 Tubulo-interstitial nephritis, not specified as acute or chronic: Secondary | ICD-10-CM | POA: Diagnosis not present

## 2016-09-12 DIAGNOSIS — N390 Urinary tract infection, site not specified: Secondary | ICD-10-CM | POA: Insufficient documentation

## 2016-09-12 DIAGNOSIS — N183 Chronic kidney disease, stage 3 (moderate): Secondary | ICD-10-CM | POA: Insufficient documentation

## 2016-09-12 DIAGNOSIS — A419 Sepsis, unspecified organism: Secondary | ICD-10-CM | POA: Insufficient documentation

## 2016-09-12 LAB — CBC
HCT: 41.3 % (ref 39.0–52.0)
Hemoglobin: 13.9 g/dL (ref 13.0–17.0)
MCH: 33.3 pg (ref 26.0–34.0)
MCHC: 33.7 g/dL (ref 30.0–36.0)
MCV: 99 fL (ref 78.0–100.0)
Platelets: 317 10*3/uL (ref 150–400)
RBC: 4.17 MIL/uL — ABNORMAL LOW (ref 4.22–5.81)
RDW: 15.4 % (ref 11.5–15.5)
WBC: 4.7 10*3/uL (ref 4.0–10.5)

## 2016-09-12 LAB — COMPREHENSIVE METABOLIC PANEL
ALT: 20 U/L (ref 17–63)
AST: 22 U/L (ref 15–41)
Albumin: 3.8 g/dL (ref 3.5–5.0)
Alkaline Phosphatase: 54 U/L (ref 38–126)
Anion gap: 6 (ref 5–15)
BUN: 14 mg/dL (ref 6–20)
CO2: 26 mmol/L (ref 22–32)
Calcium: 9.2 mg/dL (ref 8.9–10.3)
Chloride: 109 mmol/L (ref 101–111)
Creatinine, Ser: 1.25 mg/dL — ABNORMAL HIGH (ref 0.61–1.24)
GFR calc Af Amer: >60 mL/min (ref >60)
GFR calc non Af Amer: 55 mL/min — ABNORMAL LOW (ref >60)
Glucose, Bld: 125 mg/dL — ABNORMAL HIGH (ref 65–99)
Potassium: 4.4 mmol/L (ref 3.5–5.1)
Sodium: 141 mmol/L (ref 135–145)
Total Bilirubin: 0.3 mg/dL (ref 0.3–1.2)
Total Protein: 8.4 g/dL — ABNORMAL HIGH (ref 6.5–8.1)

## 2016-09-12 LAB — PROTIME-INR
INR: 1.01
Prothrombin Time: 13.3 seconds (ref 11.4–15.2)

## 2016-09-12 LAB — SURGICAL PCR SCREEN
MRSA, PCR: NEGATIVE
Staphylococcus aureus: NEGATIVE

## 2016-09-12 LAB — APTT: aPTT: 27 seconds (ref 24–36)

## 2016-09-12 NOTE — Progress Notes (Signed)
07-03-16 Surgical clearance from Dr. Alden Hipp on chart.

## 2016-09-16 ENCOUNTER — Ambulatory Visit: Payer: Self-pay | Admitting: Orthopedic Surgery

## 2016-09-16 NOTE — H&P (Signed)
Albert Hicks DOB: 21-Nov-1940 Single / Language: Albert Hicks / Race: Black or African American Male Date of Admission:  09/17/2016 CC:  Left hip pain History of Present Illness  The patient is a 76 year old male who comes in for a preoperative History and Physical. The patient is scheduled for a left total hip arthroplasty (anterior) to be performed by Dr. Dione Plover. Aluisio, MD at [facility] on 09-17-2016. The patient is a 75 year old male who presented for follow up of their hip. The patient is being followed for their left hip pain and osteoarthritis. They are year(s) out from when symptoms began. Symptoms reported include: pain and aching. The patient feels that they are doing poorly and report their pain level to be moderate. The following medication has been used for pain control: Tylenol. With regards to the left hip, it has gotten progressively worse. It hurts at all times. It is limiting what he can and cannot do. He is putting in extra stress on the left knee and feels that that is what is causing the pain and swelling. He is ready to get something done about the hip at this time. They have been treated conservatively in the past for the above stated problem and despite conservative measures, they continue to have progressive pain and severe functional limitations and dysfunction. They have failed non-operative management including home exercise, medications. It is felt that they would benefit from undergoing total joint replacement. Risks and benefits of the procedure have been discussed with the patient and they elect to proceed with surgery. There are no active contraindications to surgery such as ongoing infection or rapidly progressive neurological disease.  Problem List/Past Medical  Nausea (R11.0)  Chronic pain of left knee (M25.562)  Primary osteoarthritis of both knees (M17.0)  Primary osteoarthritis of left hip (M16.12)  Status post total left knee replacement (I95.188)   Hip pain, left (M25.552)  Gout  High blood pressure  Hypercholesterolemia  Prostate Disease  Enlarged  Allergies No Known Drug Allergies   Family History Cancer  First Degree Relatives. father Diabetes Mellitus  father First Degree Relatives  reported Hypertension  father Rheumatoid Arthritis  father Prostate Cancer   Social History  Alcohol use  current drinker; drinks wine; 5-7 per week Children  5 or more Current work status  retired Engineer, agricultural (Currently)  no Drug/Alcohol Rehab (Previously)  no Exercise  Exercises weekly; does individual sport and gym / weights Illicit drug use  no Living situation  live with partner Marital status  partnered Never smoker  Number of flights of stairs before winded  2-3 Pain Contract  no Tobacco / smoke exposure  no Tobacco use  Never smoker. never smoker  Medication History  AmLODIPine Besylate (Oral) Specific strength unknown - Active. Diclofenac Sodium (75MG  Tablet DR, 1 (one) Oral po bid prn, Taken starting 05/20/2016) Active. TraMADol HCl (50MG  Tablet, Oral) Active. Livalo 2mg  Active.  Past Surgical History Arthroscopy of Knee  left Total Knee Replacement - Left [08/21/2015]: Cataract Surgery  right Prostatectomy; Transurethral  Total Hip Replacement  right   Review of Systems General Not Present- Chills, Fatigue, Fever, Memory Loss, Night Sweats, Weight Gain and Weight Loss. Skin Not Present- Eczema, Hives, Itching, Lesions and Rash. HEENT Not Present- Dentures, Double Vision, Headache, Hearing Loss, Tinnitus and Visual Loss. Respiratory Not Present- Allergies, Chronic Cough, Coughing up blood, Shortness of breath at rest and Shortness of breath with exertion. Cardiovascular Not Present- Chest Pain, Difficulty Breathing Lying Down,  Murmur, Palpitations, Racing/skipping heartbeats and Swelling. Gastrointestinal Not Present- Abdominal Pain, Bloody Stool, Constipation, Diarrhea,  Difficulty Swallowing, Heartburn, Jaundice, Loss of appetitie, Nausea and Vomiting. Male Genitourinary Not Present- Blood in Urine, Discharge, Flank Pain, Incontinence, Painful Urination, Urgency, Urinary frequency, Urinary Retention, Urinating at Night and Weak urinary stream. Musculoskeletal Present- Joint Pain. Not Present- Back Pain, Joint Swelling, Morning Stiffness, Muscle Pain, Muscle Weakness and Spasms. Neurological Not Present- Blackout spells, Difficulty with balance, Dizziness, Paralysis, Tremor and Weakness. Psychiatric Not Present- Insomnia.  Vitals Weight: 205 lb Height: 73in Weight was reported by patient. Height was reported by patient. Body Surface Area: 2.17 m Body Mass Index: 27.05 kg/m  Pulse: 88 (Regular)  Resp.: 20 (Unlabored)  BP: 128/62 (Sitting, Left Arm, Standard)   Physical Exam General Mental Status -Alert, cooperative and good historian. General Appearance-pleasant, Not in acute distress. Orientation-Oriented X3. Build & Nutrition-Well nourished and Well developed.  Head and Neck Head-normocephalic, atraumatic . Neck Global Assessment - supple, no bruit auscultated on the right, no bruit auscultated on the left.  Eye Vision-Wears corrective lenses. Pupil - Bilateral-Regular and Round. Motion - Bilateral-EOMI.  Chest and Lung Exam Auscultation Breath sounds - clear at anterior chest wall and clear at posterior chest wall. Adventitious sounds - No Adventitious sounds.  Cardiovascular Auscultation Rhythm - Regular rate and rhythm. Heart Sounds - S1 WNL and S2 WNL. Murmurs & Other Heart Sounds - Auscultation of the heart reveals - No Murmurs.  Abdomen Palpation/Percussion Tenderness - Abdomen is non-tender to palpation. Rigidity (guarding) - Abdomen is soft. Auscultation Auscultation of the abdomen reveals - Bowel sounds normal.  Male Genitourinary Note: Not done, not pertinent to present  illness   Musculoskeletal Note: On exam, well-developed male, alert and oriented, in no apparent distress. His left hip can be flexed to 90, no internal rotation and about 10 degrees of external rotation, 10 degrees of abduction. Right knee shows a large effusion. Range of motion is 5 to 120 with marked crepitus on range of motion. Tender lateral and medial instability.  Review of his x-rays show that he has bone-on-bone arthritis in the left hip with a large osteophyte formation. His right knee does show some degenerative change, but not fully bone-on-bone throughout.   Assessment & Plan Primary osteoarthritis of left hip (M16.12)  Note:Surgical Plans: Left Total Hip Replacement - Anterior Approach  Disposition: Home with help, No HHPT, Will do HEP. Instructions will be provided  PCP: Dr. Alden Hipp - Patient has been seen preoperatively and felt to be stable for surgery.  IV TXA  Anesthesia Issues: None  Patient was instructed on what medications to stop prior to surgery.  Signed electronically by Joelene Millin, III PA-C

## 2016-09-17 ENCOUNTER — Encounter (HOSPITAL_COMMUNITY)
Admission: RE | Disposition: A | Discharge status: Home / Self Care | Payer: Self-pay | Source: Ambulatory Visit | Attending: Orthopedic Surgery

## 2016-09-17 ENCOUNTER — Inpatient Hospital Stay (HOSPITAL_COMMUNITY): Payer: Medicare Other

## 2016-09-17 ENCOUNTER — Inpatient Hospital Stay (HOSPITAL_COMMUNITY): Payer: Medicare Other | Admitting: Certified Registered Nurse Anesthetist

## 2016-09-17 ENCOUNTER — Encounter (HOSPITAL_COMMUNITY): Payer: Self-pay | Admitting: Certified Registered Nurse Anesthetist

## 2016-09-17 ENCOUNTER — Inpatient Hospital Stay (HOSPITAL_COMMUNITY)
Admission: RE | Admit: 2016-09-17 | Discharge: 2016-09-18 | DRG: 470 | Disposition: A | Discharge status: Home / Self Care | Payer: Medicare Other | Source: Ambulatory Visit | Attending: Orthopedic Surgery | Admitting: Orthopedic Surgery

## 2016-09-17 DIAGNOSIS — R972 Elevated prostate specific antigen [PSA]: Secondary | ICD-10-CM | POA: Diagnosis present

## 2016-09-17 DIAGNOSIS — E7800 Pure hypercholesterolemia, unspecified: Secondary | ICD-10-CM | POA: Diagnosis present

## 2016-09-17 DIAGNOSIS — Z96652 Presence of left artificial knee joint: Secondary | ICD-10-CM | POA: Diagnosis present

## 2016-09-17 DIAGNOSIS — I129 Hypertensive chronic kidney disease with stage 1 through stage 4 chronic kidney disease, or unspecified chronic kidney disease: Secondary | ICD-10-CM | POA: Diagnosis not present

## 2016-09-17 DIAGNOSIS — I10 Essential (primary) hypertension: Secondary | ICD-10-CM | POA: Diagnosis present

## 2016-09-17 DIAGNOSIS — Z471 Aftercare following joint replacement surgery: Secondary | ICD-10-CM | POA: Diagnosis not present

## 2016-09-17 DIAGNOSIS — N400 Enlarged prostate without lower urinary tract symptoms: Secondary | ICD-10-CM | POA: Diagnosis present

## 2016-09-17 DIAGNOSIS — N183 Chronic kidney disease, stage 3 (moderate): Secondary | ICD-10-CM | POA: Diagnosis not present

## 2016-09-17 DIAGNOSIS — M1612 Unilateral primary osteoarthritis, left hip: Secondary | ICD-10-CM | POA: Diagnosis present

## 2016-09-17 DIAGNOSIS — Z96642 Presence of left artificial hip joint: Secondary | ICD-10-CM | POA: Diagnosis not present

## 2016-09-17 DIAGNOSIS — Z96649 Presence of unspecified artificial hip joint: Secondary | ICD-10-CM

## 2016-09-17 DIAGNOSIS — E785 Hyperlipidemia, unspecified: Secondary | ICD-10-CM | POA: Diagnosis not present

## 2016-09-17 DIAGNOSIS — M169 Osteoarthritis of hip, unspecified: Secondary | ICD-10-CM | POA: Diagnosis present

## 2016-09-17 HISTORY — PX: TOTAL HIP ARTHROPLASTY: SHX124

## 2016-09-17 LAB — TYPE AND SCREEN
ABO/RH(D): O POS
Antibody Screen: NEGATIVE

## 2016-09-17 SURGERY — ARTHROPLASTY, HIP, TOTAL, ANTERIOR APPROACH
Anesthesia: Spinal | Site: Hip | Laterality: Left

## 2016-09-17 MED ORDER — ACETAMINOPHEN 10 MG/ML IV SOLN
1000.0000 mg | Freq: Once | INTRAVENOUS | Status: AC
  Administered 2016-09-17: 1000 mg via INTRAVENOUS

## 2016-09-17 MED ORDER — BUPIVACAINE IN DEXTROSE 0.75-8.25 % IT SOLN
INTRATHECAL | Status: DC | PRN
  Administered 2016-09-17: 2 mL via INTRATHECAL

## 2016-09-17 MED ORDER — METOCLOPRAMIDE HCL 5 MG/ML IJ SOLN: 5.0000 mg | Freq: Three times a day (TID) | INTRAMUSCULAR | Status: DC | PRN

## 2016-09-17 MED ORDER — DEXAMETHASONE SODIUM PHOSPHATE 10 MG/ML IJ SOLN
10.0000 mg | Freq: Once | INTRAMUSCULAR | Status: AC
Start: 2016-09-17 — End: 2016-09-17
  Administered 2016-09-17: 10 mg via INTRAVENOUS

## 2016-09-17 MED ORDER — SODIUM CHLORIDE 0.9 % IV SOLN
INTRAVENOUS | Status: DC
  Administered 2016-09-17 – 2016-09-18 (×2): via INTRAVENOUS

## 2016-09-17 MED ORDER — METHOCARBAMOL 1000 MG/10ML IJ SOLN
500.0000 mg | Freq: Four times a day (QID) | INTRAVENOUS | Status: DC | PRN
  Administered 2016-09-17: 17:00:00 500 mg via INTRAVENOUS
  Filled 2016-09-17: qty 550

## 2016-09-17 MED ORDER — CEFAZOLIN SODIUM-DEXTROSE 2-4 GM/100ML-% IV SOLN
INTRAVENOUS | Status: AC
  Filled 2016-09-17: qty 100

## 2016-09-17 MED ORDER — ONDANSETRON HCL 4 MG/2ML IJ SOLN
INTRAMUSCULAR | Status: DC | PRN
  Administered 2016-09-17: 4 mg via INTRAVENOUS

## 2016-09-17 MED ORDER — MIDAZOLAM HCL 2 MG/2ML IJ SOLN
INTRAMUSCULAR | Status: AC
  Filled 2016-09-17: qty 2

## 2016-09-17 MED ORDER — RIVAROXABAN 10 MG PO TABS
10.0000 mg | ORAL_TABLET | Freq: Every day | ORAL | Status: DC
  Administered 2016-09-18: 10 mg via ORAL
  Filled 2016-09-17: qty 1

## 2016-09-17 MED ORDER — AMLODIPINE BESYLATE 10 MG PO TABS
10.0000 mg | ORAL_TABLET | Freq: Every day | ORAL | Status: DC
Start: 2016-09-18 — End: 2016-09-18
  Administered 2016-09-18: 09:00:00 10 mg via ORAL
  Filled 2016-09-17: qty 1

## 2016-09-17 MED ORDER — ACETAMINOPHEN 10 MG/ML IV SOLN
INTRAVENOUS | Status: AC
  Filled 2016-09-17: qty 100

## 2016-09-17 MED ORDER — METOCLOPRAMIDE HCL 5 MG PO TABS: 5.0000 mg | ORAL_TABLET | Freq: Three times a day (TID) | ORAL | Status: DC | PRN

## 2016-09-17 MED ORDER — FENTANYL CITRATE (PF) 100 MCG/2ML IJ SOLN
INTRAMUSCULAR | Status: AC
  Filled 2016-09-17: qty 2

## 2016-09-17 MED ORDER — PROPOFOL 10 MG/ML IV BOLUS
INTRAVENOUS | Status: AC
  Filled 2016-09-17: qty 20

## 2016-09-17 MED ORDER — BUPIVACAINE HCL (PF) 0.25 % IJ SOLN
INTRAMUSCULAR | Status: AC
  Filled 2016-09-17: qty 30

## 2016-09-17 MED ORDER — DEXAMETHASONE SODIUM PHOSPHATE 10 MG/ML IJ SOLN
INTRAMUSCULAR | Status: AC
  Filled 2016-09-17: qty 1

## 2016-09-17 MED ORDER — COLCHICINE 0.6 MG PO TABS: 0.6000 mg | ORAL_TABLET | Freq: Every day | ORAL | Status: DC | PRN

## 2016-09-17 MED ORDER — PROPOFOL 10 MG/ML IV BOLUS
INTRAVENOUS | Status: AC
  Filled 2016-09-17: qty 60

## 2016-09-17 MED ORDER — FLEET ENEMA 7-19 GM/118ML RE ENEM: 1.0000 | ENEMA | Freq: Once | RECTAL | Status: DC | PRN

## 2016-09-17 MED ORDER — BUPIVACAINE HCL (PF) 0.25 % IJ SOLN
INTRAMUSCULAR | Status: DC | PRN
Start: 2016-09-17 — End: 2016-09-17
  Administered 2016-09-17: 30 mL

## 2016-09-17 MED ORDER — ACETAMINOPHEN 500 MG PO TABS
1000.0000 mg | ORAL_TABLET | Freq: Four times a day (QID) | ORAL | Status: AC
  Administered 2016-09-17 – 2016-09-18 (×4): 1000 mg via ORAL
  Filled 2016-09-17 (×4): qty 2

## 2016-09-17 MED ORDER — DOCUSATE SODIUM 100 MG PO CAPS
100.0000 mg | ORAL_CAPSULE | Freq: Two times a day (BID) | ORAL | Status: DC
  Administered 2016-09-17 – 2016-09-18 (×2): 100 mg via ORAL
  Filled 2016-09-17 (×2): qty 1

## 2016-09-17 MED ORDER — CHLORHEXIDINE GLUCONATE 4 % EX LIQD: 60.0000 mL | Freq: Once | CUTANEOUS | Status: DC

## 2016-09-17 MED ORDER — PHENYLEPHRINE HCL 10 MG/ML IJ SOLN
INTRAMUSCULAR | Status: DC | PRN
  Administered 2016-09-17: 80 ug via INTRAVENOUS

## 2016-09-17 MED ORDER — POLYETHYLENE GLYCOL 3350 17 G PO PACK: 17.0000 g | PACK | Freq: Every day | ORAL | Status: DC | PRN

## 2016-09-17 MED ORDER — FENTANYL CITRATE (PF) 100 MCG/2ML IJ SOLN
INTRAMUSCULAR | Status: DC | PRN
  Administered 2016-09-17 (×2): 50 ug via INTRAVENOUS

## 2016-09-17 MED ORDER — TRAMADOL HCL 50 MG PO TABS: 50.0000 mg | ORAL_TABLET | Freq: Four times a day (QID) | ORAL | Status: DC | PRN

## 2016-09-17 MED ORDER — LACTATED RINGERS IV SOLN
INTRAVENOUS | Status: DC
  Administered 2016-09-17 (×3): via INTRAVENOUS

## 2016-09-17 MED ORDER — ONDANSETRON HCL 4 MG/2ML IJ SOLN: 4.0000 mg | Freq: Four times a day (QID) | INTRAMUSCULAR | Status: DC | PRN

## 2016-09-17 MED ORDER — MORPHINE SULFATE (PF) 4 MG/ML IV SOLN: 1.0000 mg | INTRAVENOUS | Status: DC | PRN

## 2016-09-17 MED ORDER — PHENYLEPHRINE 40 MCG/ML (10ML) SYRINGE FOR IV PUSH (FOR BLOOD PRESSURE SUPPORT)
PREFILLED_SYRINGE | INTRAVENOUS | Status: AC
  Filled 2016-09-17: qty 10

## 2016-09-17 MED ORDER — FENTANYL CITRATE (PF) 100 MCG/2ML IJ SOLN: 25.0000 ug | INTRAMUSCULAR | Status: DC | PRN

## 2016-09-17 MED ORDER — PHENOL 1.4 % MT LIQD: 1.0000 | OROMUCOSAL | Status: DC | PRN

## 2016-09-17 MED ORDER — TRANEXAMIC ACID 1000 MG/10ML IV SOLN
1000.0000 mg | INTRAVENOUS | Status: AC
  Administered 2016-09-17: 1000 mg via INTRAVENOUS
  Filled 2016-09-17: qty 1100

## 2016-09-17 MED ORDER — MIDAZOLAM HCL 5 MG/5ML IJ SOLN
INTRAMUSCULAR | Status: DC | PRN
  Administered 2016-09-17: 2 mg via INTRAVENOUS

## 2016-09-17 MED ORDER — ACETAMINOPHEN 650 MG RE SUPP: 650.0000 mg | Freq: Four times a day (QID) | RECTAL | Status: DC | PRN

## 2016-09-17 MED ORDER — CEFAZOLIN SODIUM-DEXTROSE 2-4 GM/100ML-% IV SOLN
2.0000 g | Freq: Four times a day (QID) | INTRAVENOUS | Status: AC
  Administered 2016-09-17 – 2016-09-18 (×2): 2 g via INTRAVENOUS
  Filled 2016-09-17 (×2): qty 100

## 2016-09-17 MED ORDER — METHOCARBAMOL 500 MG PO TABS: 500.0000 mg | ORAL_TABLET | Freq: Four times a day (QID) | ORAL | Status: DC | PRN

## 2016-09-17 MED ORDER — BISACODYL 10 MG RE SUPP: 10.0000 mg | Freq: Every day | RECTAL | Status: DC | PRN

## 2016-09-17 MED ORDER — DEXAMETHASONE SODIUM PHOSPHATE 10 MG/ML IJ SOLN
10.0000 mg | Freq: Once | INTRAMUSCULAR | Status: AC
  Administered 2016-09-18: 10 mg via INTRAVENOUS
  Filled 2016-09-17: qty 1

## 2016-09-17 MED ORDER — HYDROMORPHONE HCL 2 MG PO TABS
2.0000 mg | ORAL_TABLET | ORAL | Status: DC | PRN
  Administered 2016-09-17 – 2016-09-18 (×2): 2 mg via ORAL
  Filled 2016-09-17: qty 2
  Filled 2016-09-17: qty 1
  Filled 2016-09-17: qty 2

## 2016-09-17 MED ORDER — ONDANSETRON HCL 4 MG/2ML IJ SOLN
INTRAMUSCULAR | Status: AC
  Filled 2016-09-17: qty 2

## 2016-09-17 MED ORDER — CEFAZOLIN SODIUM-DEXTROSE 2-4 GM/100ML-% IV SOLN
2.0000 g | INTRAVENOUS | Status: AC
  Administered 2016-09-17: 2 g via INTRAVENOUS

## 2016-09-17 MED ORDER — PROPOFOL 500 MG/50ML IV EMUL
INTRAVENOUS | Status: DC | PRN
  Administered 2016-09-17: 100 ug/kg/min via INTRAVENOUS

## 2016-09-17 MED ORDER — DIPHENHYDRAMINE HCL 12.5 MG/5ML PO ELIX: 12.5000 mg | ORAL_SOLUTION | ORAL | Status: DC | PRN

## 2016-09-17 MED ORDER — ONDANSETRON HCL 4 MG PO TABS: 4.0000 mg | ORAL_TABLET | Freq: Four times a day (QID) | ORAL | Status: DC | PRN

## 2016-09-17 MED ORDER — ACETAMINOPHEN 325 MG PO TABS: 650.0000 mg | ORAL_TABLET | Freq: Four times a day (QID) | ORAL | Status: DC | PRN

## 2016-09-17 MED ORDER — TRANEXAMIC ACID 1000 MG/10ML IV SOLN
1000.0000 mg | Freq: Once | INTRAVENOUS | Status: AC
  Administered 2016-09-17: 1000 mg via INTRAVENOUS
  Filled 2016-09-17: qty 1100

## 2016-09-17 MED ORDER — MENTHOL 3 MG MT LOZG: 1.0000 | LOZENGE | OROMUCOSAL | Status: DC | PRN

## 2016-09-17 SURGICAL SUPPLY — 38 items
BAG DECANTER FOR FLEXI CONT (MISCELLANEOUS) ×3 IMPLANT
BAG SPEC THK2 15X12 ZIP CLS (MISCELLANEOUS)
BAG ZIPLOCK 12X15 (MISCELLANEOUS) IMPLANT
BLADE SAG 18X100X1.27 (BLADE) ×3 IMPLANT
CAPT HIP TOTAL 2 ×2 IMPLANT
CLOSURE WOUND 1/2 X4 (GAUZE/BANDAGES/DRESSINGS) ×1
CLOTH BEACON ORANGE TIMEOUT ST (SAFETY) ×3 IMPLANT
COVER PERINEAL POST (MISCELLANEOUS) ×3 IMPLANT
COVER SURGICAL LIGHT HANDLE (MISCELLANEOUS) ×3 IMPLANT
DECANTER SPIKE VIAL GLASS SM (MISCELLANEOUS) ×3 IMPLANT
DRAPE STERI IOBAN 125X83 (DRAPES) ×3 IMPLANT
DRAPE U-SHAPE 47X51 STRL (DRAPES) ×6 IMPLANT
DRSG ADAPTIC 3X8 NADH LF (GAUZE/BANDAGES/DRESSINGS) ×3 IMPLANT
DRSG MEPILEX BORDER 4X4 (GAUZE/BANDAGES/DRESSINGS) ×3 IMPLANT
DRSG MEPILEX BORDER 4X8 (GAUZE/BANDAGES/DRESSINGS) ×3 IMPLANT
DURAPREP 26ML APPLICATOR (WOUND CARE) ×3 IMPLANT
ELECT REM PT RETURN 15FT ADLT (MISCELLANEOUS) ×3 IMPLANT
EVACUATOR 1/8 PVC DRAIN (DRAIN) ×3 IMPLANT
GLOVE BIO SURGEON STRL SZ7.5 (GLOVE) ×3 IMPLANT
GLOVE BIO SURGEON STRL SZ8 (GLOVE) ×6 IMPLANT
GLOVE BIOGEL PI IND STRL 8 (GLOVE) ×2 IMPLANT
GLOVE BIOGEL PI INDICATOR 8 (GLOVE) ×4
GOWN STRL REUS W/TWL LRG LVL3 (GOWN DISPOSABLE) ×3 IMPLANT
GOWN STRL REUS W/TWL XL LVL3 (GOWN DISPOSABLE) ×3 IMPLANT
NS IRRIG 1000ML POUR BTL (IV SOLUTION) ×2 IMPLANT
PACK ANTERIOR HIP CUSTOM (KITS) ×3 IMPLANT
STRIP CLOSURE SKIN 1/2X4 (GAUZE/BANDAGES/DRESSINGS) ×2 IMPLANT
SUT ETHIBOND NAB CT1 #1 30IN (SUTURE) ×3 IMPLANT
SUT MNCRL AB 4-0 PS2 18 (SUTURE) ×3 IMPLANT
SUT STRATAFIX 0 PDS 27 VIOLET (SUTURE) ×3
SUT VIC AB 2-0 CT1 27 (SUTURE) ×6
SUT VIC AB 2-0 CT1 TAPERPNT 27 (SUTURE) ×2 IMPLANT
SUTURE STRATFX 0 PDS 27 VIOLET (SUTURE) ×1 IMPLANT
SYR 50ML LL SCALE MARK (SYRINGE) IMPLANT
TRAY FOLEY W/METER SILVER 14FR (SET/KITS/TRAYS/PACK) ×2 IMPLANT
TRAY FOLEY W/METER SILVER 16FR (SET/KITS/TRAYS/PACK) ×1 IMPLANT
WATER STERILE IRR 1000ML POUR (IV SOLUTION) ×4 IMPLANT
YANKAUER SUCT BULB TIP 10FT TU (MISCELLANEOUS) ×3 IMPLANT

## 2016-09-17 NOTE — Progress Notes (Signed)
Portable AP Pelvis X-ray done. 

## 2016-09-17 NOTE — Anesthesia Procedure Notes (Signed)
Spinal  Patient location during procedure: OR Start time: 09/17/2016 12:27 PM Staffing Anesthesiologist: Annye Asa Resident/CRNA: [place] Brance Dartt C Performed: anesthesiologist and resident/CRNA  Preanesthetic Checklist Completed: patient identified, site marked, surgical consent, pre-op evaluation, timeout performed, IV checked, risks and benefits discussed and monitors and equipment checked Spinal Block Patient position: sitting Prep: Betadine Patient monitoring: heart rate, continuous pulse ox and blood pressure Injection technique: single-shot Needle Needle type: Pencan  Needle gauge: 24 G Needle length: 9 cm Assessment Sensory level: T6 Additional Notes Expiration date of kit checked and confirmed. Patient tolerated procedure well, without complications.

## 2016-09-17 NOTE — Progress Notes (Signed)
X-ray results noted 

## 2016-09-17 NOTE — Transfer of Care (Signed)
Immediate Anesthesia Transfer of Care Note  Patient: Gerold Sar  Procedure(s) Performed: Procedure(s): LEFT TOTAL HIP ARTHROPLASTY ANTERIOR APPROACH (Left)  Patient Location: PACU  Anesthesia Type:Spinal  Level of Consciousness: awake, alert  and oriented  Airway & Oxygen Therapy: Patient Spontanous Breathing and Patient connected to face mask oxygen  Post-op Assessment: Report given to RN and Post -op Vital signs reviewed and stable  Post vital signs: Reviewed and stable  Last Vitals:  Vitals:   09/17/16 1000  BP: (!) 143/82  Pulse: 82  Resp: 18  Temp: 37 C    Last Pain:  Vitals:   09/17/16 1000  TempSrc: Oral         Complications: No apparent anesthesia complications

## 2016-09-17 NOTE — Discharge Instructions (Addendum)
° °Dr. Frank Aluisio °Total Joint Specialist °[facility] °[address] °[place], [place] [address] °[phone] ° °ANTERIOR APPROACH TOTAL HIP REPLACEMENT POSTOPERATIVE DIRECTIONS ° ° °Hip Rehabilitation, Guidelines Following Surgery  °The results of a hip operation are greatly improved after range of motion and muscle strengthening exercises. Follow all safety measures which are given to protect your hip. If any of these exercises cause increased pain or swelling in your joint, decrease the amount until you are comfortable again. Then slowly increase the exercises. Call your caregiver if you have problems or questions.  ° °HOME CARE INSTRUCTIONS  °Remove items at home which could result in a fall. This includes throw rugs or furniture in walking pathways.  °· ICE to the affected hip every three hours for 30 minutes at a time and then as needed for pain and swelling.  Continue to use ice on the hip for pain and swelling from surgery. You may notice swelling that will progress down to the foot and ankle.  This is normal after surgery.  Elevate the leg when you are not up walking on it.   °· Continue to use the breathing machine which will help keep your temperature down.  It is common for your temperature to cycle up and down following surgery, especially at night when you are not up moving around and exerting yourself.  The breathing machine keeps your lungs expanded and your temperature down. ° ° °DIET °You may resume your previous home diet once your are discharged from the hospital. ° °DRESSING / WOUND CARE / SHOWERING °You may shower 3 days after surgery, but keep the wounds dry during showering.  You may use an occlusive plastic wrap (Press'n Seal for example), NO SOAKING/SUBMERGING IN THE BATHTUB.  If the bandage gets wet, change with a clean dry gauze.  If the incision gets wet, pat the wound dry with a clean towel. °You may start showering once you are discharged home but do not  submerge the incision under water. Just pat the incision dry and apply a dry gauze dressing on daily. °Change the surgical dressing daily and reapply a dry dressing each time. ° °ACTIVITY °Walk with your walker as instructed. °Use walker as long as suggested by your caregivers. °Avoid periods of inactivity such as sitting longer than an hour when not asleep. This helps prevent blood clots.  °You may resume a sexual relationship in one month or when given the OK by your doctor.  °You may return to work once you are cleared by your doctor.  °Do not drive a car for 6 weeks or until released by you surgeon.  °Do not drive while taking narcotics. ° °WEIGHT BEARING °Weight bearing as tolerated with assist device (walker, cane, etc) as directed, use it as long as suggested by your surgeon or therapist, typically at least 4-6 weeks. ° °POSTOPERATIVE CONSTIPATION PROTOCOL °Constipation - defined medically as fewer than three stools per week and severe constipation as less than one stool per week. ° °One of the most common issues patients have following surgery is constipation.  Even if you have a regular bowel pattern at home, your normal regimen is likely to be disrupted due to multiple reasons following surgery.  Combination of anesthesia, postoperative narcotics, change in appetite and fluid intake all can affect your bowels.  In order to avoid complications following surgery, here are some recommendations in order to help you during your recovery period. ° °Colace (docusate) - Pick up an over-the-counter   form of Colace or another stool softener and take twice a day as long as you are requiring postoperative pain medications.  Take with a full glass of water daily.  If you experience loose stools or diarrhea, hold the colace until you stool forms back up.  If your symptoms do not get better within 1 week or if they get worse, check with your doctor. ° °Dulcolax (bisacodyl) - Pick up over-the-counter and take as directed  by the product packaging as needed to assist with the movement of your bowels.  Take with a full glass of water.  Use this product as needed if not relieved by Colace only.  ° °MiraLax (polyethylene glycol) - Pick up over-the-counter to have on hand.  MiraLax is a solution that will increase the amount of water in your bowels to assist with bowel movements.  Take as directed and can mix with a glass of water, juice, soda, coffee, or tea.  Take if you go more than two days without a movement. °Do not use MiraLax more than once per day. Call your doctor if you are still constipated or irregular after using this medication for 7 days in a row. ° °If you continue to have problems with postoperative constipation, please contact the office for further assistance and recommendations.  If you experience "the worst abdominal pain ever" or develop nausea or vomiting, please contact the office immediatly for further recommendations for treatment. ° °ITCHING ° If you experience itching with your medications, try taking only a single pain pill, or even half a pain pill at a time.  You can also use Benadryl over the counter for itching or also to help with sleep.  ° °TED HOSE STOCKINGS °Wear the elastic stockings on both legs for three weeks following surgery during the day but you may remove then at night for sleeping. ° °MEDICATIONS °See your medication summary on the “After Visit Summary” that the nursing staff will review with you prior to discharge.  You may have some home medications which will be placed on hold until you complete the course of blood thinner medication.  It is important for you to complete the blood thinner medication as prescribed by your surgeon.  Continue your approved medications as instructed at time of discharge. ° °PRECAUTIONS °If you experience chest pain or shortness of breath - call 911 immediately for transfer to the hospital emergency department.  °If you develop a fever greater that 101 F,  purulent drainage from wound, increased redness or drainage from wound, foul odor from the wound/dressing, or calf pain - CONTACT YOUR SURGEON.   °                                                °FOLLOW-UP APPOINTMENTS °Make sure you keep all of your appointments after your operation with your surgeon and caregivers. You should call the office at the above phone number and make an appointment for approximately two weeks after the date of your surgery or on the date instructed by your surgeon outlined in the "After Visit Summary". ° °RANGE OF MOTION AND STRENGTHENING EXERCISES  °These exercises are designed to help you keep full movement of your hip joint. Follow your caregiver's or physical therapist's instructions. Perform all exercises about fifteen times, three times per day or as directed. Exercise both hips, even if you   have had only one joint replacement. These exercises can be done on a training (exercise) mat, on the floor, on a table or on a bed. Use whatever works the best and is most comfortable for you. Use music or television while you are exercising so that the exercises are a pleasant break in your day. This will make your life better with the exercises acting as a break in routine you can look forward to.  °Lying on your back, slowly slide your foot toward your buttocks, raising your knee up off the floor. Then slowly slide your foot back down until your leg is straight again.  °Lying on your back spread your legs as far apart as you can without causing discomfort.  °Lying on your side, raise your upper leg and foot straight up from the floor as far as is comfortable. Slowly lower the leg and repeat.  °Lying on your back, tighten up the muscle in the front of your thigh (quadriceps muscles). You can do this by keeping your leg straight and trying to raise your heel off the floor. This helps strengthen the largest muscle supporting your knee.  °Lying on your back, tighten up the muscles of your  buttocks both with the legs straight and with the knee bent at a comfortable angle while keeping your heel on the floor.  ° °IF YOU ARE TRANSFERRED TO A SKILLED REHAB FACILITY °If the patient is transferred to a skilled rehab facility following release from the hospital, a list of the current medications will be sent to the facility for the patient to continue.  When discharged from the skilled rehab facility, please have the facility set up the patient's Home Health Physical Therapy prior to being released. Also, the skilled facility will be responsible for providing the patient with their medications at time of release from the facility to include their pain medication, the muscle relaxants, and their blood thinner medication. If the patient is still at the rehab facility at time of the two week follow up appointment, the skilled rehab facility will also need to assist the patient in arranging follow up appointment in our office and any transportation needs. ° °MAKE SURE YOU:  °Understand these instructions.  °Get help right away if you are not doing well or get worse.  ° ° °Pick up stool softner and laxative for home use following surgery while on pain medications. °Do not submerge incision under water. °Please use good hand washing techniques while changing dressing each day. °May shower starting three days after surgery. °Please use a clean towel to pat the incision dry following showers. °Continue to use ice for pain and swelling after surgery. °Do not use any lotions or creams on the incision until instructed by your surgeon. ° °Take Xarelto for two and a half more weeks following discharge from the hospital, then discontinue Xarelto. °Once the patient has completed the blood thinner regimen, then take a Baby 81 mg Aspirin daily for three more weeks. ° ° ° ° °Information on my medicine - XARELTO® (Rivaroxaban) ° °Why was Xarelto® prescribed for you? °Xarelto® was prescribed for you to reduce the risk of blood  clots forming after orthopedic surgery. The medical term for these abnormal blood clots is venous thromboembolism (VTE). ° °What do you need to know about xarelto® ? °Take your Xarelto® ONCE DAILY at the same time every day. °You may take it either with or without food. ° °If you have difficulty swallowing the tablet whole, you may   crush it and mix in applesauce just prior to taking your dose. ° °Take Xarelto® exactly as prescribed by your doctor and DO NOT stop taking Xarelto® without talking to the doctor who prescribed the medication.  Stopping without other VTE prevention medication to take the place of Xarelto® may increase your risk of developing a clot. ° °After discharge, you should have regular check-up appointments with your healthcare provider that is prescribing your Xarelto®.   ° °What do you do if you miss a dose? °If you miss a dose, take it as soon as you remember on the same day then continue your regularly scheduled once daily regimen the next day. Do not take two doses of Xarelto® on the same day.  ° °Important Safety Information °A possible side effect of Xarelto® is bleeding. You should call your healthcare provider right away if you experience any of the following: °? Bleeding from an injury or your nose that does not stop. °? Unusual colored urine (red or dark brown) or unusual colored stools (red or black). °? Unusual bruising for unknown reasons. °? A serious fall or if you hit your head (even if there is no bleeding). ° °Some medicines may interact with Xarelto® and might increase your risk of bleeding while on Xarelto®. To help avoid this, consult your healthcare provider or pharmacist prior to using any new prescription or non-prescription medications, including herbals, vitamins, non-steroidal anti-inflammatory drugs (NSAIDs) and supplements. ° °This website has more information on Xarelto®: www.xarelto.com. ° ° ° °

## 2016-09-17 NOTE — Anesthesia Preprocedure Evaluation (Addendum)
Anesthesia Evaluation  Patient identified by MRN, date of birth, ID band Patient awake    Reviewed: Allergy & Precautions, NPO status , Patient's Chart, lab work & pertinent test results  History of Anesthesia Complications (+) PONV  Airway Mallampati: II  TM Distance: >3 FB Neck ROM: Full    Dental  (+) Dental Advisory Given, Implants, Caps   Pulmonary neg pulmonary ROS,    breath sounds clear to auscultation- rhonchi       Cardiovascular hypertension, Pt. on medications (-) angina Rhythm:Regular Rate:Normal  '16 ECHO: EF 60-65%, valves OK   Neuro/Psych negative neurological ROS     GI/Hepatic Neg liver ROS, GERD  Controlled,  Endo/Other  negative endocrine ROS  Renal/GU Renal InsufficiencyRenal disease (creat 1.25)     Musculoskeletal  (+) Arthritis , Osteoarthritis,    Abdominal (+) - obese,   Peds  Hematology negative hematology ROS (+)   Anesthesia Other Findings   Reproductive/Obstetrics                            Anesthesia Physical Anesthesia Plan  ASA: II  Anesthesia Plan: Spinal   Post-op Pain Management:    Induction:   PONV Risk Score and Plan: 2 and Ondansetron and Dexamethasone  Airway Management Planned: Natural Airway and Simple Face Mask  Additional Equipment:   Intra-op Plan:   Post-operative Plan:   Informed Consent: I have reviewed the patients History and Physical, chart, labs and discussed the procedure including the risks, benefits and alternatives for the proposed anesthesia with the patient or authorized representative who has indicated his/her understanding and acceptance.   Dental advisory given  Plan Discussed with: CRNA and Surgeon  Anesthesia Plan Comments: (Plan routine monitors, SAB)        Anesthesia Quick Evaluation

## 2016-09-17 NOTE — Interval H&P Note (Signed)
History and Physical Interval Note:  09/17/2016 10:41 AM  Albert Hicks  has presented today for surgery, with the diagnosis of Osteoarthritis Left Hip  The various methods of treatment have been discussed with the patient and family. After consideration of risks, benefits and other options for treatment, the patient has consented to  Procedure(s): LEFT TOTAL HIP ARTHROPLASTY ANTERIOR APPROACH (Left) as a surgical intervention .  The patient's history has been reviewed, patient examined, no change in status, stable for surgery.  I have reviewed the patient's chart and labs.  Questions were answered to the patient's satisfaction.     Gearlean Alf

## 2016-09-17 NOTE — Anesthesia Postprocedure Evaluation (Signed)
Anesthesia Post Note  Patient: Loi Rennaker  Procedure(s) Performed: Procedure(s) (LRB): LEFT TOTAL HIP ARTHROPLASTY ANTERIOR APPROACH (Left)     Patient location during evaluation: PACU Anesthesia Type: Spinal Level of consciousness: awake and alert, oriented and patient cooperative Pain management: pain level controlled Vital Signs Assessment: post-procedure vital signs reviewed and stable Respiratory status: spontaneous breathing, nonlabored ventilation, respiratory function stable and patient connected to nasal cannula oxygen Cardiovascular status: blood pressure returned to baseline and stable Postop Assessment: no signs of nausea or vomiting and spinal receding Anesthetic complications: no    Last Vitals:  Vitals:   09/17/16 1739 09/17/16 1829  BP: 140/76 135/77  Pulse: 66 79  Resp: 14 14  Temp: 36.2 C 36.4 C    Last Pain:  Vitals:   09/17/16 1829  TempSrc: Axillary  PainSc:                  Shirah Roseman,E. Kaydence Menard

## 2016-09-17 NOTE — Op Note (Signed)
OPERATIVE REPORT- TOTAL HIP ARTHROPLASTY   PREOPERATIVE DIAGNOSIS: Osteoarthritis of the Left hip.   POSTOPERATIVE DIAGNOSIS: Osteoarthritis of the Left  hip.   PROCEDURE: Left total hip arthroplasty, anterior approach.   SURGEON: Gaynelle Arabian, MD   ASSISTANT: Nestor Ramp, PA-S  ANESTHESIA:  Spinal  ESTIMATED BLOOD LOSS:-150 ml   DRAINS: Hemovac x1.   COMPLICATIONS: None   CONDITION: PACU - hemodynamically stable.   BRIEF CLINICAL NOTE: Albert Hicks is a 76 y.o. male who has advanced end-  stage arthritis of their Left  hip with progressively worsening pain and  dysfunction.The patient has failed nonoperative management and presents for  total hip arthroplasty.   PROCEDURE IN DETAIL: After successful administration of spinal  anesthetic, the traction boots for the [facility] bed were placed on both  feet and the patient was placed onto the [facility] bed, boots placed into the leg  holders. The Left hip was then isolated from the perineum with plastic  drapes and prepped and draped in the usual sterile fashion. ASIS and  greater trochanter were marked and a oblique incision was made, starting  at about 1 cm lateral and 2 cm distal to the ASIS and coursing towards  the anterior cortex of the femur. The skin was cut with a 10 blade  through subcutaneous tissue to the level of the fascia overlying the  tensor fascia lata muscle. The fascia was then incised in line with the  incision at the junction of the anterior third and posterior 2/3rd. The  muscle was teased off the fascia and then the interval between the TFL  and the rectus was developed. The Hohmann retractor was then placed at  the top of the femoral neck over the capsule. The vessels overlying the  capsule were cauterized and the fat on top of the capsule was removed.  A Hohmann retractor was then placed anterior underneath the rectus  femoris to give exposure to the entire anterior capsule. A T-shaped  capsulotomy was  performed. The edges were tagged and the femoral head  was identified.       Osteophytes are removed off the superior acetabulum.  The femoral neck was then cut in situ with an oscillating saw. Traction  was then applied to the left lower extremity utilizing the [facility]  traction. The femoral head was then removed. Retractors were placed  around the acetabulum and then circumferential removal of the labrum was  performed. Osteophytes were also removed. Reaming starts at 49 mm to  medialize and  Increased in 2 mm increments to 53 mm. We reamed in  approximately 40 degrees of abduction, 20 degrees anteversion. A 54 mm  pinnacle acetabular shell was then impacted in anatomic position under  fluoroscopic guidance with excellent purchase. We did not need to place  any additional dome screws. A 36 mm neutral + 4 marathon liner was then  placed into the acetabular shell.       The femoral lift was then placed along the lateral aspect of the femur  just distal to the vastus ridge. The leg was  externally rotated and capsule  was stripped off the inferior aspect of the femoral neck down to the  level of the lesser trochanter, this was done with electrocautery. The femur was lifted after this was performed. The  leg was then placed in an extended and adducted position essentially delivering the femur. We also removed the capsule superiorly and the piriformis from the piriformis fossa to  gain excellent exposure of the  proximal femur. Rongeur was used to remove some cancellous bone to get  into the lateral portion of the proximal femur for placement of the  initial starter reamer. The starter broaches was placed  the starter broach  and was shown to go down the center of the canal. Broaching  with the  Corail system was then performed starting at size 8, coursing  Up to size 12. A size 12 had excellent torsional and rotational  and axial stability. The trial high offset neck was then placed  with a 36 +  1.5 trial head. The hip was then reduced. We confirmed that  the stem was in the canal both on AP and lateral x-rays. It also has excellent sizing. The hip was reduced with outstanding stability through full extension and full external rotation.. AP pelvis was taken and the leg lengths were measured and found to be equal. Hip was then dislocated again and the femoral head and neck removed. The  femoral broach was removed. Size 12 Corail stem with a high offset  neck was then impacted into the femur following native anteversion. Has  excellent purchase in the canal. Excellent torsional and rotational and  axial stability. It is confirmed to be in the canal on AP and lateral  fluoroscopic views. The 36 + 1.5 ceramic head was placed and the hip  reduced with outstanding stability. Again AP pelvis was taken and it  confirmed that the leg lengths were equal. The wound was then copiously  irrigated with saline solution and the capsule reattached and repaired  with Ethibond suture. 30 ml of .25% Bupivicaine was  injected into the capsule and into the edge of the tensor fascia lata as well as subcutaneous tissue. The fascia overlying the tensor fascia lata was then closed with a running #1 V-Loc. Subcu was closed with interrupted 2-0 Vicryl and subcuticular running 4-0 Monocryl. Incision was cleaned  and dried. Steri-Strips and a bulky sterile dressing applied. Hemovac  drain was hooked to suction and then the patient was awakened and transported to  recovery in stable condition.        Please note that a surgical assistant was a medical necessity for this procedure to perform it in a safe and expeditious manner. Assistant was necessary to provide appropriate retraction of vital neurovascular structures and to prevent femoral fracture and allow for anatomic placement of the prosthesis.  Gaynelle Arabian, M.D.

## 2016-09-17 NOTE — H&P (View-Only) (Signed)
Albert Hicks DOB: [birth year] Single / Language: [name] / Race: Black or African American Male Date of Admission:  09/17/2016 CC:  Left hip pain History of Present Illness  The patient is a 76 year old male who comes in for a preoperative History and Physical. The patient is scheduled for a left total hip arthroplasty (anterior) to be performed by Dr. Dione Plover. Aluisio, MD at [facility] on 09-17-2016. The patient is a 76 year old male who presented for follow up of their hip. The patient is being followed for their left hip pain and osteoarthritis. They are year(s) out from when symptoms began. Symptoms reported include: pain and aching. The patient feels that they are doing poorly and report their pain level to be moderate. The following medication has been used for pain control: Tylenol. With regards to the left hip, it has gotten progressively worse. It hurts at all times. It is limiting what he can and cannot do. He is putting in extra stress on the left knee and feels that that is what is causing the pain and swelling. He is ready to get something done about the hip at this time. They have been treated conservatively in the past for the above stated problem and despite conservative measures, they continue to have progressive pain and severe functional limitations and dysfunction. They have failed non-operative management including home exercise, medications. It is felt that they would benefit from undergoing total joint replacement. Risks and benefits of the procedure have been discussed with the patient and they elect to proceed with surgery. There are no active contraindications to surgery such as ongoing infection or rapidly progressive neurological disease.  Problem List/Past Medical  Nausea (R11.0)  Chronic pain of left knee (M25.562)  Primary osteoarthritis of both knees (M17.0)  Primary osteoarthritis of left hip (M16.12)  Status post total left knee replacement (Z61.096)   Hip pain, left (M25.552)  Gout  High blood pressure  Hypercholesterolemia  Prostate Disease  Enlarged  Allergies No Known Drug Allergies   Family History Cancer  First Degree Relatives. father Diabetes Mellitus  father First Degree Relatives  reported Hypertension  father Rheumatoid Arthritis  father Prostate Cancer   Social History  Alcohol use  current drinker; drinks wine; 5-7 per week Children  5 or more Current work status  retired Engineer, agricultural (Currently)  no Drug/Alcohol Rehab (Previously)  no Exercise  Exercises weekly; does individual sport and gym / weights Illicit drug use  no Living situation  live with partner Marital status  partnered Never smoker  Number of flights of stairs before winded  2-3 Pain Contract  no Tobacco / smoke exposure  no Tobacco use  Never smoker. never smoker  Medication History  AmLODIPine Besylate (Oral) Specific strength unknown - Active. Diclofenac Sodium (75MG  Tablet DR, 1 (one) Oral po bid prn, Taken starting 05/20/2016) Active. TraMADol HCl (50MG  Tablet, Oral) Active. Livalo 2mg  Active.  Past Surgical History Arthroscopy of Knee  left Total Knee Replacement - Left [08/21/2015]: Cataract Surgery  right Prostatectomy; Transurethral  Total Hip Replacement  right   Review of Systems General Not Present- Chills, Fatigue, Fever, Memory Loss, Night Sweats, Weight Gain and Weight Loss. Skin Not Present- Eczema, Hives, Itching, Lesions and Rash. HEENT Not Present- Dentures, Double Vision, Headache, Hearing Loss, Tinnitus and Visual Loss. Respiratory Not Present- Allergies, Chronic Cough, Coughing up blood, Shortness of breath at rest and Shortness of breath with exertion. Cardiovascular Not Present- Chest Pain, Difficulty Breathing Lying Down,  Murmur, Palpitations, Racing/skipping heartbeats and Swelling. Gastrointestinal Not Present- Abdominal Pain, Bloody Stool, Constipation, Diarrhea,  Difficulty Swallowing, Heartburn, Jaundice, Loss of appetitie, Nausea and Vomiting. Male Genitourinary Not Present- Blood in Urine, Discharge, Flank Pain, Incontinence, Painful Urination, Urgency, Urinary frequency, Urinary Retention, Urinating at Night and Weak urinary stream. Musculoskeletal Present- Joint Pain. Not Present- Back Pain, Joint Swelling, Morning Stiffness, Muscle Pain, Muscle Weakness and Spasms. Neurological Not Present- Blackout spells, Difficulty with balance, Dizziness, Paralysis, Tremor and Weakness. Psychiatric Not Present- Insomnia.  Vitals Weight: 205 lb Height: 73in Weight was reported by patient. Height was reported by patient. Body Surface Area: 2.17 m Body Mass Index: 27.05 kg/m  Pulse: 88 (Regular)  Resp.: 20 (Unlabored)  BP: 128/62 (Sitting, Left Arm, Standard)   Physical Exam General Mental Status -Alert, cooperative and good historian. General Appearance-pleasant, Not in acute distress. Orientation-Oriented X3. Build & Nutrition-Well nourished and Well developed.  Head and Neck Head-normocephalic, atraumatic . Neck Global Assessment - supple, no bruit auscultated on the right, no bruit auscultated on the left.  Eye Vision-Wears corrective lenses. Pupil - Bilateral-Regular and Round. Motion - Bilateral-EOMI.  Chest and Lung Exam Auscultation Breath sounds - clear at anterior chest wall and clear at posterior chest wall. Adventitious sounds - No Adventitious sounds.  Cardiovascular Auscultation Rhythm - Regular rate and rhythm. Heart Sounds - S1 WNL and S2 WNL. Murmurs & Other Heart Sounds - Auscultation of the heart reveals - No Murmurs.  Abdomen Palpation/Percussion Tenderness - Abdomen is non-tender to palpation. Rigidity (guarding) - Abdomen is soft. Auscultation Auscultation of the abdomen reveals - Bowel sounds normal.  Male Genitourinary Note: Not done, not pertinent to present  illness   Musculoskeletal Note: On exam, well-developed male, alert and oriented, in no apparent distress. His left hip can be flexed to 90, no internal rotation and about 10 degrees of external rotation, 10 degrees of abduction. Right knee shows a large effusion. Range of motion is 5 to 120 with marked crepitus on range of motion. Tender lateral and medial instability.  Review of his x-rays show that he has bone-on-bone arthritis in the left hip with a large osteophyte formation. His right knee does show some degenerative change, but not fully bone-on-bone throughout.   Assessment & Plan Primary osteoarthritis of left hip (M16.12)  Note:Surgical Plans: Left Total Hip Replacement - Anterior Approach  Disposition: Home with help, No HHPT, Will do HEP. Instructions will be provided  PCP: Dr. Alden Hipp - Patient has been seen preoperatively and felt to be stable for surgery.  IV TXA  Anesthesia Issues: None  Patient was instructed on what medications to stop prior to surgery.  Signed electronically by Joelene Millin, III PA-C

## 2016-09-18 LAB — BASIC METABOLIC PANEL
Anion gap: 8 (ref 5–15)
BUN: 14 mg/dL (ref 6–20)
CO2: 23 mmol/L (ref 22–32)
Calcium: 8.9 mg/dL (ref 8.9–10.3)
Chloride: 106 mmol/L (ref 101–111)
Creatinine, Ser: 1.2 mg/dL (ref 0.61–1.24)
GFR calc Af Amer: >60 mL/min (ref >60)
GFR calc non Af Amer: 57 mL/min — ABNORMAL LOW (ref >60)
Glucose, Bld: 171 mg/dL — ABNORMAL HIGH (ref 65–99)
Potassium: 4.4 mmol/L (ref 3.5–5.1)
Sodium: 137 mmol/L (ref 135–145)

## 2016-09-18 LAB — CBC
HCT: 37.4 % — ABNORMAL LOW (ref 39.0–52.0)
Hemoglobin: 12.6 g/dL — ABNORMAL LOW (ref 13.0–17.0)
MCH: 32.1 pg (ref 26.0–34.0)
MCHC: 33.7 g/dL (ref 30.0–36.0)
MCV: 95.2 fL (ref 78.0–100.0)
Platelets: 288 10*3/uL (ref 150–400)
RBC: 3.93 MIL/uL — ABNORMAL LOW (ref 4.22–5.81)
RDW: 14.3 % (ref 11.5–15.5)
WBC: 8.9 10*3/uL (ref 4.0–10.5)

## 2016-09-18 MED ORDER — METHOCARBAMOL 500 MG PO TABS: 500.0000 mg | ORAL_TABLET | Freq: Four times a day (QID) | ORAL | 0 refills | Status: DC | PRN

## 2016-09-18 MED ORDER — TRAMADOL HCL 50 MG PO TABS: 50.0000 mg | ORAL_TABLET | Freq: Four times a day (QID) | ORAL | 0 refills | Status: DC | PRN

## 2016-09-18 MED ORDER — HYDROMORPHONE HCL 2 MG PO TABS: 2.0000 mg | ORAL_TABLET | ORAL | 0 refills | Status: DC | PRN

## 2016-09-18 MED ORDER — RIVAROXABAN 10 MG PO TABS: 10.0000 mg | ORAL_TABLET | Freq: Every day | ORAL | 0 refills | Status: DC

## 2016-09-18 NOTE — Evaluation (Signed)
Physical Therapy Evaluation Patient Details Name: Albert Hicks MRN: [id number] DOB: February 22, 1941 Today's Date: 09/18/2016   History of Present Illness  Pt s/p L THR and with hx of L TKR (5/17) and R THR (4/07)  Clinical Impression  Pt s/p L THR and presents with decreased L LE strength/ROM and post op pain limiting functional mobility.  Pt should progress to dc home with assist of significant other.    Follow Up Recommendations DC plan and follow up therapy as arranged by surgeon    Equipment Recommendations  None recommended by PT    Recommendations for Other Services       Precautions / Restrictions Precautions Precautions: Fall Restrictions Weight Bearing Restrictions: No      Mobility  Bed Mobility Overal bed mobility: Needs Assistance Bed Mobility: Supine to Sit     Supine to sit: Min assist     General bed mobility comments: cues for sequence and use of R LE to self assist  Transfers Overall transfer level: Needs assistance Equipment used: Rolling walker (2 wheeled) Transfers: Sit to/from Stand Sit to Stand: Min assist;Min guard         General transfer comment: cues for LE management and use of UEs to self assist  Ambulation/Gait Ambulation/Gait assistance: Min assist;Min guard Ambulation Distance (Feet): 160 Feet Assistive device: Rolling walker (2 wheeled) Gait Pattern/deviations: Step-to pattern;Step-through pattern;Decreased step length - right;Decreased step length - left;Shuffle;Trunk flexed Gait velocity: decr Gait velocity interpretation: Below normal speed for age/gender General Gait Details: cues for posture, position from RW and initial sequence  Stairs            Wheelchair Mobility    Modified Rankin (Stroke Patients Only)       Balance                                             Pertinent Vitals/Pain Pain Assessment: 0-10 Pain Score: 4  Pain Location: L hip Pain Descriptors / Indicators:  Aching;Sore Pain Intervention(s): Limited activity within patient's tolerance;Monitored during session;Premedicated before session;Ice applied    Home Living Family/patient expects to be discharged to:: Private residence Living Arrangements: Spouse/significant other Available Help at Discharge: Family Type of Home: House Home Access: Stairs to enter Entrance Stairs-Rails: None Entrance Stairs-Number of Steps: 3 Home Layout: Two level Home Equipment: Environmental consultant - 2 wheels;Cane - quad;Bedside commode      Prior Function Level of Independence: Independent               Hand Dominance        Extremity/Trunk Assessment   Upper Extremity Assessment Upper Extremity Assessment: Overall WFL for tasks assessed    Lower Extremity Assessment Lower Extremity Assessment: LLE deficits/detail LLE Deficits / Details: Strength at hip 2+/5 with AAROM at hip to 80 flex and 15 abd    Cervical / Trunk Assessment Cervical / Trunk Assessment: Normal  Communication   Communication: No difficulties  Cognition Arousal/Alertness: Awake/alert Behavior During Therapy: WFL for tasks assessed/performed Overall Cognitive Status: Within Functional Limits for tasks assessed                                        General Comments      Exercises Total Joint Exercises Ankle Circles/Pumps: AROM;Both;20 reps;Supine Quad Sets: AROM;Both;10 reps;Supine  Heel Slides: AAROM;Left;20 reps;Supine Hip ABduction/ADduction: AAROM;Left;15 reps;Supine   Assessment/Plan    PT Assessment Patient needs continued PT services  PT Problem List Decreased strength;Decreased range of motion;Decreased activity tolerance;Decreased mobility;Decreased knowledge of use of DME;Pain       PT Treatment Interventions DME instruction;Gait training;Stair training;Functional mobility training;Therapeutic activities;Therapeutic exercise;Patient/family education    PT Goals (Current goals can be found in the  Care Plan section)  Acute Rehab PT Goals Patient Stated Goal: Regain IND PT Goal Formulation: With patient Time For Goal Achievement: 09/20/16 Potential to Achieve Goals: Good    Frequency 7X/week   Barriers to discharge        Co-evaluation               AM-PAC PT "6 Clicks" Daily Activity  Outcome Measure Difficulty turning over in bed (including adjusting bedclothes, sheets and blankets)?: A Lot Difficulty moving from lying on back to sitting on the side of the bed? : A Lot Difficulty sitting down on and standing up from a chair with arms (e.g., wheelchair, bedside commode, etc,.)?: A Lot Help needed moving to and from a bed to chair (including a wheelchair)?: A Little Help needed walking in hospital room?: A Little Help needed climbing 3-5 steps with a railing? : A Little 6 Click Score: 15    End of Session Equipment Utilized During Treatment: Gait belt Activity Tolerance: Patient tolerated treatment well Patient left: in chair;with call bell/phone within reach Nurse Communication: Mobility status PT Visit Diagnosis: Difficulty in walking, not elsewhere classified (R26.2)    Time: 1914-7829 PT Time Calculation (min) (ACUTE ONLY): 35 min   Charges:   PT Evaluation $PT Eval Low Complexity: 1 Procedure PT Treatments $Therapeutic Exercise: 8-22 mins   PT G Codes:        Pg [phone]    Jackilyn Umphlett 09/18/2016, 12:42 PM

## 2016-09-18 NOTE — Care Management Note (Signed)
Case Management Note  Patient Details  Name: Albert Hicks MRN: [id number] Date of Birth: [birth year]  Subjective/Objective:   Left THA                 Action/Plan: Discharge Planning:  NCM spoke to pt at bedside. Pt reports his significant other will be at home to assist with his care. He has RW, cane and 3n1 bedside commode at home. No HH arranged this surgery per Ortho RN Coordinator.   PCP Donella Stade MD  Expected Discharge Date:  09/18/16               Expected Discharge Plan:  Home/Self Care  In-House Referral:  NA  Discharge planning Services  CM Consult  Post Acute Care Choice:  NA Choice offered to:  NA  DME Arranged:  N/A DME Agency:  NA  HH Arranged:  NA HH Agency:  NA  Status of Service:  Completed, signed off  If discussed at [place] of Stay Meetings, dates discussed:    Additional Comments:  Erenest Rasher, RN 09/18/2016, 10:36 AM

## 2016-09-18 NOTE — Progress Notes (Signed)
Physical Therapy Treatment Patient Details Name: Albert Hicks MRN: [id number] DOB: 03/14/1941 Today's Date: 09/18/2016    History of Present Illness Pt s/p L THR and with hx of L TKR (5/17) and R THR (4/07)    PT Comments    Pt progressing well with mobility and eager for dc home this date.  Reviewed car transfers, stairs, and home therex with progression.  Written instruction provided.   Follow Up Recommendations  DC plan and follow up therapy as arranged by surgeon     Equipment Recommendations  None recommended by PT    Recommendations for Other Services       Precautions / Restrictions Precautions Precautions: Fall Restrictions Weight Bearing Restrictions: No    Mobility  Bed Mobility Overal bed mobility: Needs Assistance Bed Mobility: Supine to Sit;Sit to Supine     Supine to sit: Min guard Sit to supine: Min assist   General bed mobility comments: cues for sequence and use of R LE to self assist  Transfers Overall transfer level: Needs assistance Equipment used: Rolling walker (2 wheeled) Transfers: Sit to/from Stand Sit to Stand: Min guard;Supervision         General transfer comment: cues for LE management and use of UEs to self assist  Ambulation/Gait Ambulation/Gait assistance: Min guard;Supervision Ambulation Distance (Feet): 280 Feet Assistive device: Rolling walker (2 wheeled) Gait Pattern/deviations: Step-to pattern;Step-through pattern;Decreased step length - right;Decreased step length - left;Shuffle;Trunk flexed Gait velocity: decr Gait velocity interpretation: Below normal speed for age/gender General Gait Details: cues for posture, position from RW and initial sequence   Stairs Stairs: Yes   Stair Management: No rails;One rail Left;Step to pattern;Forwards;Backwards;With walker;With cane Number of Stairs: 8 General stair comments: 4 stairs bkwd with RW; 4 steps fwd with cane and rail.  cues for sequence and foot/cane/RW placement.   Written instructions provided  Wheelchair Mobility    Modified Rankin (Stroke Patients Only)       Balance                                            Cognition Arousal/Alertness: Awake/alert Behavior During Therapy: WFL for tasks assessed/performed Overall Cognitive Status: Within Functional Limits for tasks assessed                                        Exercises Total Joint Exercises Ankle Circles/Pumps: AROM;Both;20 reps;Supine Quad Sets: AROM;Both;10 reps;Supine Heel Slides: AAROM;Left;20 reps;Supine Hip ABduction/ADduction: AAROM;Left;15 reps;Supine Long Arc Quad: AAROM;AROM;Left;10 reps;Seated    General Comments        Pertinent Vitals/Pain Pain Assessment: 0-10 Pain Score: 4  Pain Location: L hip Pain Descriptors / Indicators: Aching;Sore Pain Intervention(s): Limited activity within patient's tolerance;Monitored during session;Ice applied    Home Living                      Prior Function            PT Goals (current goals can now be found in the care plan section) Acute Rehab PT Goals Patient Stated Goal: Regain IND PT Goal Formulation: With patient Time For Goal Achievement: 09/20/16 Potential to Achieve Goals: Good Progress towards PT goals: Progressing toward goals    Frequency    7X/week      PT Plan  Current plan remains appropriate    Co-evaluation              AM-PAC PT "6 Clicks" Daily Activity  Outcome Measure  Difficulty turning over in bed (including adjusting bedclothes, sheets and blankets)?: A Little Difficulty moving from lying on back to sitting on the side of the bed? : A Little Difficulty sitting down on and standing up from a chair with arms (e.g., wheelchair, bedside commode, etc,.)?: A Little Help needed moving to and from a bed to chair (including a wheelchair)?: A Little Help needed walking in hospital room?: A Little Help needed climbing 3-5 steps with a railing?  : A Little 6 Click Score: 18    End of Session Equipment Utilized During Treatment: Gait belt Activity Tolerance: Patient tolerated treatment well Patient left: in bed;with call bell/phone within reach Nurse Communication: Mobility status PT Visit Diagnosis: Difficulty in walking, not elsewhere classified (R26.2)     Time: 0175-1025 PT Time Calculation (min) (ACUTE ONLY): 49 min  Charges:  $Gait Training: 8-22 mins $Therapeutic Exercise: 8-22 mins $Therapeutic Activity: 8-22 mins                    G Codes:       Pg [phone]    Albert Hicks 09/18/2016, 5:05 PM

## 2016-09-18 NOTE — Discharge Summary (Signed)
Physician Discharge Summary   Patient ID: Albert Hicks MRN: [id number] DOB/AGE: Oct 12, 1940 76 y.o.  Admit date: 09/17/2016 Discharge date: 09-18-2016  Primary Diagnosis:  Osteoarthritis of the Left hip.   Admission Diagnoses:  Past Medical History:  Diagnosis Date  . Acid reflux OCCASIONALLY TAKE TUMS  . BPH (benign prostatic hypertrophy)   . Complication of anesthesia   . Elevated PSA   . Gross hematuria   . Hydronephrosis, left   . Hyperlipidemia   . Hypertension   . OA (osteoarthritis) knees  . PONV (postoperative nausea and vomiting)   . Tinnitus   . Wears glasses    Discharge Diagnoses:   Principal Problem:   OA (osteoarthritis) of hip  Estimated body mass index is 27.05 kg/m as calculated from the following:   Height as of this encounter: _0  (1.854 m).   Weight as of this encounter: 93 kg (205 lb).  Procedure:  Procedure(s) (LRB): LEFT TOTAL HIP ARTHROPLASTY ANTERIOR APPROACH (Left)   Consults: None  HPI: Albert Hicks is a 75 y.o. male who has advanced end-  stage arthritis of their Left  hip with progressively worsening pain and  dysfunction.The patient has failed nonoperative management and presents for  total hip arthroplasty.   Laboratory Data: Admission on 09/17/2016  Component Date Value Ref Range Status  . WBC 09/18/2016 8.9  4.0 - 10.5 K/uL Final  . RBC 09/18/2016 3.93* 4.22 - 5.81 MIL/uL Final  . Hemoglobin 09/18/2016 12.6* 13.0 - 17.0 g/dL Final  . HCT 09/18/2016 37.4* 39.0 - 52.0 % Final  . MCV 09/18/2016 95.2  78.0 - 100.0 fL Final  . MCH 09/18/2016 32.1  26.0 - 34.0 pg Final  . MCHC 09/18/2016 33.7  30.0 - 36.0 g/dL Final  . RDW 09/18/2016 14.3  11.5 - 15.5 % Final  . Platelets 09/18/2016 288  150 - 400 K/uL Final  . Sodium 09/18/2016 137  135 - 145 mmol/L Final  . Potassium 09/18/2016 4.4  3.5 - 5.1 mmol/L Final  . Chloride 09/18/2016 106  101 - 111 mmol/L Final  . CO2 09/18/2016 23  22 - 32 mmol/L Final  . Glucose, Bld 09/18/2016 171*  65 - 99 mg/dL Final  . BUN 09/18/2016 14  6 - 20 mg/dL Final  . Creatinine, Ser 09/18/2016 1.20  0.61 - 1.24 mg/dL Final  . Calcium 09/18/2016 8.9  8.9 - 10.3 mg/dL Final  . GFR calc non Af Amer 09/18/2016 57* >60 mL/min Final  . GFR calc Af Amer 09/18/2016 >60  >60 mL/min Final   Comment: (NOTE) The eGFR has been calculated using the CKD EPI equation. This calculation has not been validated in all clinical situations. eGFR's persistently <60 mL/min signify possible Chronic Kidney Disease.   Georgiann Hahn gap 09/18/2016 8  5 - 15 Final  Hospital Outpatient Visit on 09/12/2016  Component Date Value Ref Range Status  . aPTT 09/12/2016 27  24 - 36 seconds Final  . WBC 09/12/2016 4.7  4.0 - 10.5 K/uL Final  . RBC 09/12/2016 4.17* 4.22 - 5.81 MIL/uL Final  . Hemoglobin 09/12/2016 13.9  13.0 - 17.0 g/dL Final  . HCT 09/12/2016 41.3  39.0 - 52.0 % Final  . MCV 09/12/2016 99.0  78.0 - 100.0 fL Final  . MCH 09/12/2016 33.3  26.0 - 34.0 pg Final  . MCHC 09/12/2016 33.7  30.0 - 36.0 g/dL Final  . RDW 09/12/2016 15.4  11.5 - 15.5 % Final  . Platelets 09/12/2016 317  150 -  400 K/uL Final  . Sodium 09/12/2016 141  135 - 145 mmol/L Final  . Potassium 09/12/2016 4.4  3.5 - 5.1 mmol/L Final  . Chloride 09/12/2016 109  101 - 111 mmol/L Final  . CO2 09/12/2016 26  22 - 32 mmol/L Final  . Glucose, Bld 09/12/2016 125* 65 - 99 mg/dL Final  . BUN 09/12/2016 14  6 - 20 mg/dL Final  . Creatinine, Ser 09/12/2016 1.25* 0.61 - 1.24 mg/dL Final  . Calcium 09/12/2016 9.2  8.9 - 10.3 mg/dL Final  . Total Protein 09/12/2016 8.4* 6.5 - 8.1 g/dL Final  . Albumin 09/12/2016 3.8  3.5 - 5.0 g/dL Final  . AST 09/12/2016 22  15 - 41 U/L Final  . ALT 09/12/2016 20  17 - 63 U/L Final  . Alkaline Phosphatase 09/12/2016 54  38 - 126 U/L Final  . Total Bilirubin 09/12/2016 0.3  0.3 - 1.2 mg/dL Final  . GFR calc non Af Amer 09/12/2016 55* >60 mL/min Final  . GFR calc Af Amer 09/12/2016 >60  >60 mL/min Final   Comment:  (NOTE) The eGFR has been calculated using the CKD EPI equation. This calculation has not been validated in all clinical situations. eGFR's persistently <60 mL/min signify possible Chronic Kidney Disease.   . Anion gap 09/12/2016 6  5 - 15 Final  . Prothrombin Time 09/12/2016 13.3  11.4 - 15.2 seconds Final  . INR 09/12/2016 1.01   Final  . ABO/RH(D) 09/12/2016 O POS   Final  . Antibody Screen 09/12/2016 NEG   Final  . Sample Expiration 09/12/2016 09/20/2016   Final  . MRSA, PCR 09/12/2016 NEGATIVE  NEGATIVE Final  . Staphylococcus aureus 09/12/2016 NEGATIVE  NEGATIVE Final   Comment:        The Xpert SA Assay (FDA approved for NASAL specimens in patients over 47 years of age), is one component of a comprehensive surveillance program.  Test performance has been validated by [facility] for patients greater than or equal to 71 year old. It is not intended to diagnose infection nor to guide or monitor treatment.      X-Rays:Dg Pelvis Portable  Result Date: 09/17/2016 CLINICAL DATA:  Left hip replacement. EXAM: PORTABLE PELVIS 1-2 VIEWS COMPARISON:  None. FINDINGS: The patient is status post left hip replacement. Acetabular and femoral components are in good position. A surgical drain is noted. An old right hip replacement is identified as well. IMPRESSION: Left hip replacement as above. Electronically Signed   By: Dorise Bullion III M.D   On: 09/17/2016 14:54   Dg C-arm 1-60 Min-no Report  Result Date: 09/17/2016 Fluoroscopy was utilized by the requesting physician.  No radiographic interpretation.    EKG: Orders placed or performed during the hospital encounter of 09/12/16  . EKG 12 lead  . EKG 12 lead     Hospital Course: Albert Hicks is a 76 y.o. who was admitted to [facility]. They were brought to the operating room on 09/17/2016 and underwent Procedure(s): LEFT TOTAL HIP ARTHROPLASTY ANTERIOR APPROACH.  Patient tolerated the procedure well and was later  transferred to the recovery room and then to the orthopaedic floor for postoperative care.  They were given PO and IV analgesics for pain control following their surgery.  They were given 24 hours of postoperative antibiotics of  Anti-infectives    Start     Dose/Rate Route Frequency Ordered Stop   09/17/16 1830  ceFAZolin (ANCEF) IVPB 2g/100 mL premix     2  g 200 mL/hr over 30 Minutes Intravenous Every 6 hours 09/17/16 1629 09/18/16 0153   09/17/16 0953  ceFAZolin (ANCEF) 2-4 GM/100ML-% IVPB    Comments:  Waldron Session   : cabinet override      09/17/16 1610 09/17/16 1229   09/17/16 0952  ceFAZolin (ANCEF) IVPB 2g/100 mL premix     2 g 200 mL/hr over 30 Minutes Intravenous On call to O.R. 09/17/16 9604 09/17/16 1259     and started on DVT prophylaxis in the form of Xarelto.   PT and OT were ordered for total joint protocol.  Discharge planning consulted to help with postop disposition and equipment needs.  Patient had a good night on the evening of surgery.  They started to get up OOB with therapy on day one. Hemovac drain was pulled without difficulty. Patient was seen in rounds by Dr. Wynelle Link and it was felt as long as they met their therapy goals they would be ready to go home later that same day.  Diet - Cardiac diet Follow up - in 2 weeks Activity - WBAT Disposition - Home Condition Upon Discharge - improving D/C Meds - See DC Summary DVT Prophylaxis - Xarelto  Discharge Instructions    Call MD / Call 911    Complete by:  As directed    If you experience chest pain or shortness of breath, CALL 911 and be transported to the hospital emergency room.  If you develope a fever above 101 F, pus (white drainage) or increased drainage or redness at the wound, or calf pain, call your surgeon's office.   Change dressing    Complete by:  As directed    You may change your dressing dressing daily with sterile 4 x 4 inch gauze dressing and paper tape.  Do not submerge the incision under  water.   Constipation Prevention    Complete by:  As directed    Drink plenty of fluids.  Prune juice may be helpful.  You may use a stool softener, such as Colace (over the counter) 100 mg twice a day.  Use MiraLax (over the counter) for constipation as needed.   Diet - low sodium heart healthy    Complete by:  As directed    Discharge instructions    Complete by:  As directed    Take Xarelto for two and a half more weeks, then discontinue Xarelto. Once the patient has completed the blood thinner regimen, then take a Baby 81 mg Aspirin daily for three more weeks.   Pick up stool softner and laxative for home use following surgery while on pain medications. Do not submerge incision under water. Please use good hand washing techniques while changing dressing each day. May shower starting three days after surgery. Please use a clean towel to pat the incision dry following showers. Continue to use ice for pain and swelling after surgery. Do not use any lotions or creams on the incision until instructed by your surgeon.  Wear both TED hose on both legs during the day every day for three weeks, but may remove the TED hose at night at home.  Postoperative Constipation Protocol  Constipation - defined medically as fewer than three stools per week and severe constipation as less than one stool per week.  One of the most common issues patients have following surgery is constipation.  Even if you have a regular bowel pattern at home, your normal regimen is likely to be disrupted due to multiple reasons following  surgery.  Combination of anesthesia, postoperative narcotics, change in appetite and fluid intake all can affect your bowels.  In order to avoid complications following surgery, here are some recommendations in order to help you during your recovery period.  Colace (docusate) - Pick up an over-the-counter form of Colace or another stool softener and take twice a day as long as you are  requiring postoperative pain medications.  Take with a full glass of water daily.  If you experience loose stools or diarrhea, hold the colace until you stool forms back up.  If your symptoms do not get better within 1 week or if they get worse, check with your doctor.  Dulcolax (bisacodyl) - Pick up over-the-counter and take as directed by the product packaging as needed to assist with the movement of your bowels.  Take with a full glass of water.  Use this product as needed if not relieved by Colace only.   MiraLax (polyethylene glycol) - Pick up over-the-counter to have on hand.  MiraLax is a solution that will increase the amount of water in your bowels to assist with bowel movements.  Take as directed and can mix with a glass of water, juice, soda, coffee, or tea.  Take if you go more than two days without a movement. Do not use MiraLax more than once per day. Call your doctor if you are still constipated or irregular after using this medication for 7 days in a row.  If you continue to have problems with postoperative constipation, please contact the office for further assistance and recommendations.  If you experience "the worst abdominal pain ever" or develop nausea or vomiting, please contact the office immediatly for further recommendations for treatment.   Do not sit on low chairs, stoools or toilet seats, as it may be difficult to get up from low surfaces    Complete by:  As directed    Driving restrictions    Complete by:  As directed    No driving until released by the physician.   Increase activity slowly as tolerated    Complete by:  As directed    Lifting restrictions    Complete by:  As directed    No lifting until released by the physician.   Patient may shower    Complete by:  As directed    You may shower without a dressing once there is no drainage.  Do not wash over the wound.  If drainage remains, do not shower until drainage stops.   TED hose    Complete by:  As  directed    Use stockings (TED hose) for 3 weeks on both leg(s).  You may remove them at night for sleeping.   Weight bearing as tolerated    Complete by:  As directed    Laterality:  left   Extremity:  Lower     Allergies as of 09/18/2016      Reactions   Oxycodone Nausea Only   Pt states all medications with oxy medication in it causes nausea   Lipitor [atorvastatin] Other (See Comments)   Muscle ache   Statins Other (See Comments)   Muscle soreness      Medication List    STOP taking these medications   ibuprofen 200 MG tablet Commonly known as:  ADVIL,MOTRIN   LIVALO 2 MG Tabs Generic drug:  Pitavastatin Calcium     TAKE these medications   acetaminophen 650 MG CR tablet Commonly known as:  TYLENOL Take 1,300  mg by mouth every 8 (eight) hours as needed for pain.   allopurinol 100 MG tablet Commonly known as:  ZYLOPRIM Take 1 tablet (100 mg total) by mouth daily.   amLODipine 10 MG tablet Commonly known as:  NORVASC Take 1 tablet (10 mg total) by mouth daily.   colchicine 0.6 MG tablet Take 1 tablet (0.6 mg total) by mouth daily. What changed:  when to take this  reasons to take this   HYDROmorphone 2 MG tablet Commonly known as:  DILAUDID Take 1-2 tablets (2-4 mg total) by mouth every 4 (four) hours as needed for moderate pain or severe pain.   methocarbamol 500 MG tablet Commonly known as:  ROBAXIN Take 1 tablet (500 mg total) by mouth every 6 (six) hours as needed for muscle spasms.   rivaroxaban 10 MG Tabs tablet Commonly known as:  XARELTO Take 1 tablet (10 mg total) by mouth daily with breakfast. Take Xarelto for two and a half more weeks following discharge from the hospital, then discontinue Xarelto. Once the patient has completed the blood thinner regimen, then take a Baby 81 mg Aspirin daily for three more weeks.   SYSTANE OP Place 1-2 drops into both eyes daily as needed (For dry eyes.).   traMADol 50 MG tablet Commonly known as:   ULTRAM Take 1-2 tablets (50-100 mg total) by mouth every 6 (six) hours as needed for moderate pain.      Follow-up Information    Gaynelle Arabian, MD. Schedule an appointment as soon as possible for a visit on 09/30/2016.   Specialty:  Orthopedic Surgery Contact information: [address] [place] [address] [phone]           Signed: Arlee Muslim, PA-C Orthopaedic Surgery 09/18/2016, 8:18 AM

## 2016-09-18 NOTE — Progress Notes (Signed)
RN reviewed discharge instructions with patient. All questions answered.   Paperwork and prescriptions given.   RN to watch patient until patient's transportation arrives, and will answer any further questions that patient/family may have.

## 2016-09-18 NOTE — Progress Notes (Addendum)
   Subjective: 1 Day Post-Op Procedure(s) (LRB): LEFT TOTAL HIP ARTHROPLASTY ANTERIOR APPROACH (Left) Patient reports pain as mild.   Patient seen in rounds by Dr. Wynelle Link. Patient is well, and has had no acute complaints or problems We will start therapy today.  If they do well with therapy and meets all goals, then will allow home later this afternoon following therapy. Plan is to go Home after hospital stay.  Objective: Vital signs in last 24 hours: Temp:  [97.2 F (36.2 C)-98.6 F (37 C)] 97.9 F (36.6 C) (06/28 0647) Pulse Rate:  [54-82] 77 (06/28 0647) Resp:  [14-18] 15 (06/28 0647) BP: (121-152)/(67-94) 141/77 (06/28 0647) SpO2:  [97 %-100 %] 100 % (06/28 0647) Weight:  [93 kg (205 lb)] 93 kg (205 lb) (06/27 1019)  Intake/Output from previous day:  Intake/Output Summary (Last 24 hours) at 09/18/16 0808 Last data filed at 09/18/16 0647  Gross per 24 hour  Intake          3563.33 ml  Output             2465 ml  Net          1098.33 ml    Intake/Output this shift: No intake/output data recorded.  Labs:  Recent Labs  09/18/16 0413  HGB 12.6*    Recent Labs  09/18/16 0413  WBC 8.9  RBC 3.93*  HCT 37.4*  PLT 288    Recent Labs  09/18/16 0413  NA 137  K 4.4  CL 106  CO2 23  BUN 14  CREATININE 1.20  GLUCOSE 171*  CALCIUM 8.9   No results for input(s): LABPT, INR in the last 72 hours.  EXAM General - Patient is Alert, Appropriate and Oriented Extremity - Neurovascular intact Sensation intact distally Intact pulses distally Dorsiflexion/Plantar flexion intact Dressing - dressing C/D/I Motor Function - intact, moving foot and toes well on exam.  Hemovac pulled without difficulty.  Past Medical History:  Diagnosis Date  . Acid reflux OCCASIONALLY TAKE TUMS  . BPH (benign prostatic hypertrophy)   . Complication of anesthesia   . Elevated PSA   . Gross hematuria   . Hydronephrosis, left   . Hyperlipidemia   . Hypertension   . OA  (osteoarthritis) knees  . PONV (postoperative nausea and vomiting)   . Tinnitus   . Wears glasses     Assessment/Plan: 1 Day Post-Op Procedure(s) (LRB): LEFT TOTAL HIP ARTHROPLASTY ANTERIOR APPROACH (Left) Principal Problem:   OA (osteoarthritis) of hip  Estimated body mass index is 27.05 kg/m as calculated from the following:   Height as of this encounter: 6\' 1"  (1.854 m).   Weight as of this encounter: 93 kg (205 lb). Up with therapy Discharge home - No HHPT, Will do HEP. Instruction and exercise sheet provided.  DVT Prophylaxis - Xarelto Weight Bearing As Tolerated left Leg Hemovac Pulled Begin Therapy  If meets goals and able to go home: Up with therapy Diet - Cardiac diet Follow up - in 2 weeks Activity - WBAT Disposition - Home Condition Upon Discharge - improving D/C Meds - See DC Summary DVT Prophylaxis - Xarelto  Arlee Muslim, PA-C Orthopaedic Surgery 09/18/2016, 8:08 AM

## 2016-09-30 DIAGNOSIS — M1612 Unilateral primary osteoarthritis, left hip: Secondary | ICD-10-CM | POA: Diagnosis not present

## 2016-09-30 DIAGNOSIS — Z96642 Presence of left artificial hip joint: Secondary | ICD-10-CM | POA: Diagnosis not present

## 2016-09-30 DIAGNOSIS — Z471 Aftercare following joint replacement surgery: Secondary | ICD-10-CM | POA: Diagnosis not present

## 2016-10-21 DIAGNOSIS — M1612 Unilateral primary osteoarthritis, left hip: Secondary | ICD-10-CM | POA: Diagnosis not present

## 2016-10-21 DIAGNOSIS — Z471 Aftercare following joint replacement surgery: Secondary | ICD-10-CM | POA: Diagnosis not present

## 2016-10-21 DIAGNOSIS — Z96642 Presence of left artificial hip joint: Secondary | ICD-10-CM | POA: Diagnosis not present

## 2016-12-01 ENCOUNTER — Ambulatory Visit (INDEPENDENT_AMBULATORY_CARE_PROVIDER_SITE_OTHER): Payer: Medicare Other | Admitting: Physician Assistant

## 2016-12-01 ENCOUNTER — Encounter: Payer: Self-pay | Admitting: Physician Assistant

## 2016-12-01 VITALS — BP 137/63 | HR 98 | Wt 201.0 lb

## 2016-12-01 DIAGNOSIS — L723 Sebaceous cyst: Secondary | ICD-10-CM | POA: Diagnosis not present

## 2016-12-01 DIAGNOSIS — L089 Local infection of the skin and subcutaneous tissue, unspecified: Secondary | ICD-10-CM | POA: Diagnosis not present

## 2016-12-01 MED ORDER — DOXYCYCLINE HYCLATE 100 MG PO TABS: 100.0000 mg | ORAL_TABLET | Freq: Two times a day (BID) | ORAL | 0 refills | Status: DC

## 2016-12-01 NOTE — Patient Instructions (Signed)
Epidermal Cyst An epidermal cyst is a small, painless lump under your skin. It may be called an epidermal inclusion cyst or an infundibular cyst. The cyst contains a grayish-white, bad-smelling substance (keratin). It is important not to pop epidermal cysts yourself. These cysts are usually harmless (benign), but they can get infected. Symptoms of infection may include:  Redness.  Inflammation.  Tenderness.  Warmth.  Fever.  A grayish-white, bad-smelling substance draining from the cyst.  Pus draining from the cyst.  Follow these instructions at home:  Take over-the-counter and prescription medicines only as told by your doctor.  If you were prescribed an antibiotic, use it as told by your doctor. Do not stop using the antibiotic even if you start to feel better.  Keep the area around your cyst clean and dry.  Wear loose, dry clothing.  Do not try to pop your cyst.  Avoid touching your cyst.  Check your cyst every day for signs of infection.  Keep all follow-up visits as told by your doctor. This is important. How is this prevented?  Wear clean, dry, clothing.  Avoid wearing tight clothing.  Keep your skin clean and dry. Shower or take baths every day.  Wash your body with a benzoyl peroxide wash when you shower or bathe. Contact a health care provider if:  Your cyst has symptoms of infection.  Your condition is not improving or is getting worse.  You have a cyst that looks different from other cysts you have had.  You have a fever. Get help right away if:  Redness spreads from the cyst into the surrounding area. This information is not intended to replace advice given to you by your health care provider. Make sure you discuss any questions you have with your health care provider. Document Released: 04/17/2004 Document Revised: 11/07/2015 Document Reviewed: 01/10/2015 Elsevier Interactive Patient Education  2018 [employer]  

## 2016-12-01 NOTE — Progress Notes (Signed)
   Subjective:    Patient ID: Albert Hicks, male    DOB: Sep 09, 1940, 76 y.o.   MRN: [id number]  HPI  Pt is a 76 yo male who presents to the clinic with a lump on his back. Reports being there for 6 years but never bothering him until last month. It has gotten bigger and bigger and now red and tender to touch. No fever, chills. Not tried anything to make better. Nothing seems to make worse. He would like it removed.     Review of Systems See HPI.     Objective:   Physical Exam  Skin:             Assessment & Plan:  [name] KitchenMarland KitchenRayan was seen today for possible cyst on the back.  Diagnoses and all orders for this visit:  Infected sebaceous cyst of skin -     doxycycline (VIBRA-TABS) 100 MG tablet; Take 1 tablet (100 mg total) by mouth 2 (two) times daily.   Sebaceous Cyst Excision Procedure Note  Pre-operative Diagnosis: sebaceous cyst infected  Post-operative Diagnosis: same  Locations:right upper back at midline.   Indications: infected sebaceous cyst  Anesthesia: Lidocaine 2% with epinephrine without added sodium bicarbonate  Procedure Details  History of allergy to iodine: no  Patient informed of the risks (including bleeding and infection) and benefits of the  procedure and Verbal informed consent obtained.  The lesion and surrounding area was given a sterile prep using betadyne and draped in the usual sterile fashion. An incision was made over the cyst, which was dissected free of the surrounding tissue and removed.  The cyst was filled with typical sebaceous material.  The wound was closed with 4-0 Prolene using simple interrupted stitches. Antibiotic ointment and a sterile dressing applied.  The specimen was not sent for pathologic examination. The patient tolerated the procedure well.  EBL: scant  Findings: sebaceous material  Condition: Stable  Complications: none.  Plan: 1. Instructed to keep the wound dry and covered for 24-48h and clean thereafter. 2.  Warning signs of infection were reviewed.   3. Recommended that the patient use OTC acetaminophen as needed for pain.  4. Return for suture removal in 7 days.

## 2016-12-02 DIAGNOSIS — M1612 Unilateral primary osteoarthritis, left hip: Secondary | ICD-10-CM | POA: Diagnosis not present

## 2016-12-02 DIAGNOSIS — L089 Local infection of the skin and subcutaneous tissue, unspecified: Secondary | ICD-10-CM | POA: Insufficient documentation

## 2016-12-02 DIAGNOSIS — L723 Sebaceous cyst: Secondary | ICD-10-CM

## 2016-12-08 ENCOUNTER — Encounter: Payer: Self-pay | Admitting: Physician Assistant

## 2016-12-08 ENCOUNTER — Ambulatory Visit (INDEPENDENT_AMBULATORY_CARE_PROVIDER_SITE_OTHER): Payer: Medicare Other | Admitting: Physician Assistant

## 2016-12-08 VITALS — BP 132/62 | HR 97 | Ht 73.0 in | Wt 199.0 lb

## 2016-12-08 DIAGNOSIS — L089 Local infection of the skin and subcutaneous tissue, unspecified: Secondary | ICD-10-CM

## 2016-12-08 DIAGNOSIS — R630 Anorexia: Secondary | ICD-10-CM

## 2016-12-08 DIAGNOSIS — T50905A Adverse effect of unspecified drugs, medicaments and biological substances, initial encounter: Secondary | ICD-10-CM

## 2016-12-08 DIAGNOSIS — T887XXA Unspecified adverse effect of drug or medicament, initial encounter: Secondary | ICD-10-CM

## 2016-12-08 DIAGNOSIS — L723 Sebaceous cyst: Principal | ICD-10-CM

## 2016-12-08 MED ORDER — MEGESTROL ACETATE 400 MG/10ML PO SUSP: 400.0000 mg | Freq: Every day | ORAL | 1 refills | Status: DC

## 2016-12-08 NOTE — Progress Notes (Signed)
Subjective:    Patient ID: Albert Hicks, male    DOB: 1940-04-08, 76 y.o.   MRN: [id number]  HPI Pt is a 76 yo male who presents to the clinic for 1 week s/p seb cyst removal to get sutures out. seb cyst was infected and given doxycycline. He got an itchy rash for the doxycycline. No SOB or difficulty breathing. He finished it on Saturday. He continues to itch. He has not tried anything to stop the itch.   He also would like to start something to increase his appetite. He wants to get back in shape and start weight lifting again. He does not want to take protein other than in diet due to his CKD.   .. Active Ambulatory Problems    Diagnosis Date Noted  . Hyperlipidemia   . Hypertension   . Obstruction of kidney 02/23/2012  . CKD (chronic kidney disease) stage 3, GFR 30-59 ml/min 03/23/2012  . Pyelonephritis 05/09/2012  . Hypokalemia 05/09/2012  . Sepsis due to urinary tract infection ([place]) 05/09/2012  . Hyponatremia 05/09/2012  . Leukocytosis, unspecified 05/09/2012  . Diarrhea 05/11/2012  . History of gout 06/14/2012  . BPH (benign prostatic hypertrophy) with urinary obstruction 07/03/2012  . Osteoarthritis of both knees 12/01/2012  . Osteoarthritis of both shoulders 07/15/2013  . Colon polyp 04/17/2014  . Nonspecific abnormal electrocardiogram (ECG) (EKG) 05/02/2014  . Abnormal ECG 06/14/2014  . Primary osteoarthritis of both knees 01/18/2015  . Elevated fasting glucose 06/08/2015  . Prediabetes 06/22/2015  . Primary osteoarthritis of left knee 07/10/2015  . S/P left TKA 08/21/2015  . Overweight (BMI 25.0-29.9) 08/22/2015  . Acute idiopathic gout of right knee 07/16/2016  . Muscular atrophy 07/16/2016  . OA (osteoarthritis) of hip 07/16/2016  . Normocytic anemia 07/17/2016  . Decreased testosterone level 07/17/2016  . Infected sebaceous cyst 12/02/2016  . Loss of appetite 12/08/2016   Resolved Ambulatory Problems    Diagnosis Date Noted  . History of elevated PSA  01/20/2012  . Osteoarthritis of both knees 01/20/2012  . Enlarged prostate 02/23/2012  . CKD (chronic kidney disease) 03/23/2012  . S/P knee replacement 08/21/2015   Past Medical History:  Diagnosis Date  . Acid reflux OCCASIONALLY TAKE TUMS  . BPH (benign prostatic hypertrophy)   . Complication of anesthesia   . Elevated PSA   . Gross hematuria   . Hydronephrosis, left   . Hyperlipidemia   . Hypertension   . OA (osteoarthritis) knees  . PONV (postoperative nausea and vomiting)   . Tinnitus   . Wears glasses       Review of Systems  All other systems reviewed and are negative.      Objective:   Physical Exam  Constitutional: He is oriented to person, place, and time. He appears well-developed and well-nourished.  Cardiovascular: Normal rate, regular rhythm and normal heart sounds.   Neurological: He is alert and oriented to person, place, and time.  Skin:  2cm incision that is indurated and continues to have some sebaceous material draining out of it. Non tender.   Scattered diffuse erythematous macular patches with no scales or vesicles. Some scattered excoriations. On trunk only.   Psychiatric: He has a normal mood and affect. His behavior is normal.          Assessment & Plan:  .Diagnoses and all orders for this visit:  Infected sebaceous cyst -     Ambulatory referral to Dermatology  Loss of appetite -     megestrol (MEGACE)  400 MG/10ML suspension; Take 10 mLs (400 mg total) by mouth daily.  Medication reaction, initial encounter  medication reaction. Added doxycycline to allergy list.  Given benadryl in office today. Continue to take as needed for itching and rash.   Attempted to remove cyst completely today but not able to get complete sac out due to extensive size and need for larger incision that would have needed elliptical incision and I did not feel comfortable enough to proceed. Packed today and did not close will send to dermatology for further  management.   Sebaceous Cyst Excision Procedure Note  Pre-operative Diagnosis: sebaceous cyst  Post-operative Diagnosis: same  Locations:right upper back  Indications: infected cyst with irrigation.   Anesthesia: Lidocaine 1% without epinephrine without added sodium bicarbonate  Procedure Details  History of allergy to iodine: no  Patient informed of the risks (including bleeding and infection) and benefits of the  procedure and Verbal informed consent obtained.  The lesion and surrounding area was given a sterile prep using alcohol and draped in the usual sterile fashion. An incision was made over the cyst, which was dissected free of the surrounding tissue and removed.  The cyst was filled with typical sebaceous material.  The wound was left open and packed with antibiotic packing. Antibiotic ointment and a sterile dressing applied.  The specimen was not sent for pathologic examination. The patient tolerated the procedure well.  EBL: scant   Findings: sebaceous materal with extensive fibrous scar tissue extending beyond excision.   Condition: Stable  Complications: none.  Plan: 1. Instructed to keep the wound dry and covered for 24-48h and clean thereafter. 2. Warning signs of infection were reviewed.   3. Recommended that the patient use OTC acetaminophen as needed for pain.   discussed no charge for procedure since had to repeat from 1 week ago  Started megace for appetitive encouragement. Follow up in 2 months.

## 2016-12-08 NOTE — Patient Instructions (Signed)
Follow up with derm

## 2016-12-09 DIAGNOSIS — L720 Epidermal cyst: Secondary | ICD-10-CM | POA: Diagnosis not present

## 2016-12-11 ENCOUNTER — Telehealth: Payer: Self-pay | Admitting: *Deleted

## 2016-12-11 NOTE — Telephone Encounter (Signed)
[id number] Pre Authorization sent to cover my meds.

## 2016-12-22 NOTE — Telephone Encounter (Signed)
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 PA denied. It also looks like an appeal was sent. Appeal denied as well

## 2016-12-22 NOTE — Telephone Encounter (Signed)
Patient aware.

## 2016-12-22 NOTE — Telephone Encounter (Signed)
Let patient know.

## 2016-12-30 DIAGNOSIS — L720 Epidermal cyst: Secondary | ICD-10-CM | POA: Diagnosis not present

## 2017-01-14 ENCOUNTER — Ambulatory Visit (INDEPENDENT_AMBULATORY_CARE_PROVIDER_SITE_OTHER): Payer: Medicare Other

## 2017-01-14 ENCOUNTER — Encounter: Payer: Self-pay | Admitting: Physician Assistant

## 2017-01-14 ENCOUNTER — Ambulatory Visit (INDEPENDENT_AMBULATORY_CARE_PROVIDER_SITE_OTHER): Payer: Medicare Other | Admitting: Physician Assistant

## 2017-01-14 VITALS — BP 123/72 | HR 92 | Temp 98.3°F | Wt 204.0 lb

## 2017-01-14 DIAGNOSIS — R0989 Other specified symptoms and signs involving the circulatory and respiratory systems: Secondary | ICD-10-CM | POA: Diagnosis not present

## 2017-01-14 DIAGNOSIS — I10 Essential (primary) hypertension: Secondary | ICD-10-CM

## 2017-01-14 DIAGNOSIS — R05 Cough: Secondary | ICD-10-CM | POA: Diagnosis not present

## 2017-01-14 DIAGNOSIS — R058 Other specified cough: Secondary | ICD-10-CM

## 2017-01-14 MED ORDER — BENZONATATE 200 MG PO CAPS: 200.0000 mg | ORAL_CAPSULE | Freq: Three times a day (TID) | ORAL | 0 refills | Status: DC | PRN

## 2017-01-14 MED ORDER — AZITHROMYCIN 250 MG PO TABS: ORAL_TABLET | ORAL | 0 refills | Status: DC

## 2017-01-14 NOTE — Progress Notes (Signed)
Normal chest x-ray No pneumonia Treatment plan does not change

## 2017-01-14 NOTE — Progress Notes (Signed)
HPI:                                                                Albert Hicks is a 76 y.o. male who presents to [place]: [place] today for cough and weakness  Patient reports developing cold symptoms, sore throat and nasal congestion 2 weeks ago. He felt he was improving. Reports second sickening about a week ago after flying to [facility].   Cough  This is a new problem. The current episode started 1 to 4 weeks ago (x 2 weeks). The problem has been unchanged. The problem occurs every few minutes. The cough is productive of sputum. Associated symptoms include nasal congestion and rhinorrhea. Pertinent negatives include no chills, fever, shortness of breath or wheezing. Exacerbated by: talking. Risk factors for lung disease include travel (flew to [place]). Treatments tried: Robitussin. The treatment provided no relief. There is no history of asthma, COPD or pneumonia.     Past Medical History:  Diagnosis Date  . Acid reflux OCCASIONALLY TAKE TUMS  . BPH (benign prostatic hypertrophy)   . Complication of anesthesia   . Elevated PSA   . Gross hematuria   . Hydronephrosis, left   . Hyperlipidemia   . Hypertension   . OA (osteoarthritis) knees  . PONV (postoperative nausea and vomiting)   . Tinnitus   . Wears glasses    Past Surgical History:  Procedure Laterality Date  . APPENDECTOMY  AGE 64  . CATARACT EXTRACTION W/ INTRAOCULAR LENS IMPLANT  2013   RIGHT EYE  . CYSTOSCOPY  02/13/2012   Procedure: CYSTOSCOPY FLEXIBLE;  Surgeon: Hanley Ben, MD;  Location: [facility];  Service: Urology;  Laterality: N/A;  . LEFT KNEE SURGERY  1993  (APPROX)  . PROSTATECTOMY N/A 08/03/2012   Procedure: SIMPLE RETROPUBIC PROSTATECTOMY, removal of left double J stent. ;  Surgeon: Hanley Ben, MD;  Location: [facility];  Service: Urology;  Laterality: N/A;  . TOTAL HIP ARTHROPLASTY  07-09-2005  DR ALUISIO   RIGHT HIP OA  . TOTAL HIP  ARTHROPLASTY Left 09/17/2016   Procedure: LEFT TOTAL HIP ARTHROPLASTY ANTERIOR APPROACH;  Surgeon: Gaynelle Arabian, MD;  Location: [facility];  Service: Orthopedics;  Laterality: Left;  . TOTAL KNEE ARTHROPLASTY Left 08/21/2015   Procedure: LEFT TOTAL KNEE ARTHROPLASTY;  Surgeon: Paralee Cancel, MD;  Location: [facility];  Service: Orthopedics;  Laterality: Left;   Social History  Substance Use Topics  . Smoking status: Never Smoker  . Smokeless tobacco: Never Used  . Alcohol use Yes     Comment: 2 glasses wine per night   family history includes Cancer in his father; Diabetes in his father; Hypertension in his father and mother.  ROS: negative except as noted in the HPI  Medications: Current Outpatient Prescriptions  Medication Sig Dispense Refill  . acetaminophen (TYLENOL) 650 MG CR tablet Take 1,300 mg by mouth every 8 (eight) hours as needed for pain.    [name] Kitchen amLODipine (NORVASC) 10 MG tablet Take 1 tablet (10 mg total) by mouth daily. 30 tablet 5  . colchicine 0.6 MG tablet Take 1 tablet (0.6 mg total) by mouth daily. 30 tablet 0  . Polyethyl Glycol-Propyl Glycol (SYSTANE OP) Place 1-2 drops into both eyes daily  as needed (For dry eyes.).    [name] Kitchen traMADol (ULTRAM) 50 MG tablet Take 1-2 tablets (50-100 mg total) by mouth every 6 (six) hours as needed for moderate pain. 56 tablet 0  . azithromycin (ZITHROMAX Z-PAK) 250 MG tablet Take 2 tablets (500 mg) on  Day 1,  followed by 1 tablet (250 mg) once daily on Days 2 through 5. 6 tablet 0  . benzonatate (TESSALON) 200 MG capsule Take 1 capsule (200 mg total) by mouth 3 (three) times daily as needed for cough. 45 capsule 0   No current facility-administered medications for this visit.    Allergies  Allergen Reactions  . Oxycodone Nausea Only    Pt states all medications with oxy medication in it causes nausea  . Doxycycline     Itchy/rash  . Lipitor [Atorvastatin] Other (See Comments)    Muscle ache  . Statins Other (See Comments)    Muscle  soreness       Objective:  BP 123/72   Pulse 92   Temp 98.3 F (36.8 C) (Oral)   Wt 204 lb (92.5 kg)   SpO2 98%   BMI 26.91 kg/m  Gen:  alert, not ill-appearing, no distress, appropriate for age HEENT: head normocephalic without obvious abnormality, conjunctiva and cornea clear, trachea midline Pulm: Normal work of breathing, normal phonation, expiratory crackles in the right posterior chest wall  CV: Normal rate, regular rhythm, s1 and s2 distinct, no murmurs, clicks or rubs  Skin: intact, no rashes on exposed skin, no jaundice, no cyanosis   Depression screen [facility] 2/9 12/08/2016 12/01/2016  Decreased Interest 0 0  Down, Depressed, Hopeless 0 0  PHQ - 2 Score 0 0     No results found for this or any previous visit (from the past 72 hour(s)). No results found.    Assessment and Plan: 76 y.o. male with   1. Productive cough - symptoms present for 2 weeks with second sickening, age >89, and recent air travel. Will treat empirically for CAP with Azithromycin. Chest x-ray today. Vitals reviewed and normal. - due for influenza and Pneumovax vaccines - azithromycin (ZITHROMAX Z-PAK) 250 MG tablet; Take 2 tablets (500 mg) on  Day 1,  followed by 1 tablet (250 mg) once daily on Days 2 through 5.  Dispense: 6 tablet; Refill: 0 - benzonatate (TESSALON) 200 MG capsule; Take 1 capsule (200 mg total) by mouth 3 (three) times daily as needed for cough.  Dispense: 45 capsule; Refill: 0 - DG Chest 2 View; Future  2. Respiratory crackles 1/2 way up posterior chest wall on right side - DG Chest 2 View; Future  Patient education and anticipatory guidance given Patient agrees with treatment plan Follow-up in 1 week with PCP for cough/immunizations or sooner as needed if symptoms worsen or fail to improve  Darlyne Russian PA-C

## 2017-01-14 NOTE — Patient Instructions (Addendum)
Cough, Adult Coughing is a reflex that clears your throat and your airways. Coughing helps to heal and protect your lungs. It is normal to cough occasionally, but a cough that happens with other symptoms or lasts a long time may be a sign of a condition that needs treatment. A cough may last only 2-3 weeks (acute), or it may last longer than 8 weeks (chronic). What are the causes? Coughing is commonly caused by:  Breathing in substances that irritate your lungs.  A viral or bacterial respiratory infection.  Allergies.  Asthma.  Postnasal drip.  Smoking.  Acid backing up from the stomach into the esophagus (gastroesophageal reflux).  Certain medicines.  Chronic lung problems, including COPD (or rarely, lung cancer).  Other medical conditions such as heart failure.  Follow these instructions at home: Pay attention to any changes in your symptoms. Take these actions to help with your discomfort:  Take medicines only as told by your health care provider. ? If you were prescribed an antibiotic medicine, take it as told by your health care provider. Do not stop taking the antibiotic even if you start to feel better. ? Talk with your health care provider before you take a cough suppressant medicine.  Drink enough fluid to keep your urine clear or pale yellow.  If the air is dry, use a cold steam vaporizer or humidifier in your bedroom or your home to help loosen secretions.  Avoid anything that causes you to cough at work or at home.  If your cough is worse at night, try sleeping in a semi-upright position.  Avoid cigarette smoke. If you smoke, quit smoking. If you need help quitting, ask your health care provider.  Avoid caffeine.  Avoid alcohol.  Rest as needed.  Contact a health care provider if:  You have new symptoms.  You cough up pus.  Your cough does not get better after 2-3 weeks, or your cough gets worse.  You cannot control your cough with suppressant  medicines and you are losing sleep.  You develop pain that is getting worse or pain that is not controlled with pain medicines.  You have a fever.  You have unexplained weight loss.  You have night sweats. Get help right away if:  You cough up blood.  You have difficulty breathing.  Your heartbeat is very fast. This information is not intended to replace advice given to you by your health care provider. Make sure you discuss any questions you have with your health care provider. Document Released: 09/06/2010 Document Revised: 08/16/2015 Document Reviewed: 05/17/2014 Elsevier Interactive Patient Education  2017 Elsevier Inc.  

## 2017-01-21 ENCOUNTER — Ambulatory Visit (INDEPENDENT_AMBULATORY_CARE_PROVIDER_SITE_OTHER): Payer: Medicare Other | Admitting: Physician Assistant

## 2017-01-21 ENCOUNTER — Encounter: Payer: Self-pay | Admitting: Physician Assistant

## 2017-01-21 VITALS — BP 133/60 | HR 100 | Ht 73.0 in | Wt 200.0 lb

## 2017-01-21 DIAGNOSIS — R05 Cough: Secondary | ICD-10-CM

## 2017-01-21 DIAGNOSIS — R058 Other specified cough: Secondary | ICD-10-CM

## 2017-01-21 MED ORDER — PREDNISONE 50 MG PO TABS: ORAL_TABLET | ORAL | 0 refills | Status: DC

## 2017-01-21 MED ORDER — METHYLPREDNISOLONE SODIUM SUCC 125 MG IJ SOLR
125.0000 mg | Freq: Once | INTRAMUSCULAR | Status: AC
  Administered 2017-01-21: 125 mg via INTRAMUSCULAR

## 2017-01-21 NOTE — Patient Instructions (Signed)

## 2017-01-21 NOTE — Progress Notes (Signed)
   Subjective:    Patient ID: Albert Hicks, male    DOB: 10-02-1940, 76 y.o.   MRN: [id number]  HPI Pt is a 76 yo male who presents to the clinic to discuss persistent cough. He was seen on 01/14/17 and given zpak and tessalon pearls. He feels much better but still has dry cough. Denies any fever, chills, SOB, wheezing. Pt is an Manufacturing systems engineer for football and cannot be coughing this weekend.   .. Active Ambulatory Problems    Diagnosis Date Noted  . Hyperlipidemia   . Hypertension   . Obstruction of kidney 02/23/2012  . CKD (chronic kidney disease) stage 3, GFR 30-59 ml/min ([facility]) 03/23/2012  . Pyelonephritis 05/09/2012  . Hypokalemia 05/09/2012  . Sepsis due to urinary tract infection ([place]) 05/09/2012  . Hyponatremia 05/09/2012  . Leukocytosis, unspecified 05/09/2012  . Diarrhea 05/11/2012  . History of gout 06/14/2012  . BPH (benign prostatic hypertrophy) with urinary obstruction 07/03/2012  . Osteoarthritis of both knees 12/01/2012  . Osteoarthritis of both shoulders 07/15/2013  . Colon polyp 04/17/2014  . Nonspecific abnormal electrocardiogram (ECG) (EKG) 05/02/2014  . Abnormal ECG 06/14/2014  . Primary osteoarthritis of both knees 01/18/2015  . Elevated fasting glucose 06/08/2015  . Prediabetes 06/22/2015  . Primary osteoarthritis of left knee 07/10/2015  . S/P left TKA 08/21/2015  . Overweight (BMI 25.0-29.9) 08/22/2015  . Acute idiopathic gout of right knee 07/16/2016  . Muscular atrophy 07/16/2016  . OA (osteoarthritis) of hip 07/16/2016  . Normocytic anemia 07/17/2016  . Decreased testosterone level 07/17/2016  . Infected sebaceous cyst 12/02/2016  . Loss of appetite 12/08/2016   Resolved Ambulatory Problems    Diagnosis Date Noted  . History of elevated PSA 01/20/2012  . Osteoarthritis of both knees 01/20/2012  . Enlarged prostate 02/23/2012  . CKD (chronic kidney disease) 03/23/2012  . S/P knee replacement 08/21/2015   Past Medical History:  Diagnosis Date  .  Acid reflux OCCASIONALLY TAKE TUMS  . BPH (benign prostatic hypertrophy)   . Complication of anesthesia   . Elevated PSA   . Gross hematuria   . Hydronephrosis, left   . Hyperlipidemia   . Hypertension   . OA (osteoarthritis) knees  . PONV (postoperative nausea and vomiting)   . Tinnitus   . Wears glasses       Review of Systems See HPI.     Objective:   Physical Exam        Assessment & Plan:  [name] KitchenMarland KitchenDiagnoses and all orders for this visit:  Post-viral cough syndrome -     predniSONE (DELTASONE) 50 MG tablet; Take one tablet for 5 days. -     methylPREDNISolone sodium succinate (SOLU-MEDROL) 125 mg/2 mL injection 125 mg; Inject 2 mLs (125 mg total) into the muscle once.   Reassurance given I do not see or hear anything worrisome on exam.  Solumedrol given to jump start feeling better and prednisone to start tomorrow.  HO given.  Consider humidfer.  Suck on hard candy.  Follow up as needed.

## 2017-01-22 ENCOUNTER — Encounter: Payer: Self-pay | Admitting: Physician Assistant

## 2017-02-04 ENCOUNTER — Encounter: Payer: Self-pay | Admitting: Physician Assistant

## 2017-02-04 ENCOUNTER — Ambulatory Visit (INDEPENDENT_AMBULATORY_CARE_PROVIDER_SITE_OTHER): Payer: Medicare Other | Admitting: Physician Assistant

## 2017-02-04 VITALS — BP 139/75 | HR 99 | Ht 72.99 in | Wt 202.0 lb

## 2017-02-04 DIAGNOSIS — R0789 Other chest pain: Secondary | ICD-10-CM

## 2017-02-04 DIAGNOSIS — E7800 Pure hypercholesterolemia, unspecified: Secondary | ICD-10-CM | POA: Diagnosis not present

## 2017-02-04 LAB — CK TOTAL AND CKMB (NOT AT ARMC)
CK, MB: <0.7 ng/mL (ref 0–5.0)
Total CK: 30 U/L — ABNORMAL LOW (ref 44–196)

## 2017-02-04 LAB — TROPONIN I: Troponin I: 0.01 ng/mL (ref <=0.0)

## 2017-02-04 MED ORDER — PITAVASTATIN CALCIUM 4 MG PO TABS: 1.0000 | ORAL_TABLET | Freq: Every day | ORAL | 3 refills | Status: DC

## 2017-02-04 NOTE — Progress Notes (Signed)
Subjective:    Patient ID: Albert Hicks, male    DOB: Aug 11, 1940, 76 y.o.   MRN: [id number]  HPI  Pt is a 76 yo male who presents to the clinic with chest pains that started this morning at 4am and radiated into left side of neck and down left arm. No increase in pain with exertion. Pain resolved on it's own without any intervention by noon and has not come back. He has not done anything to make better. Denies any known injury. He did eat chilli last night before bed. He is also still coughing and has been for last 2 weeks. His cough is improving.   He had a friend die recently from heart attack after not seeking help for chest pain.   .. Active Ambulatory Problems    Diagnosis Date Noted  . Hyperlipidemia   . Hypertension   . Obstruction of kidney 02/23/2012  . CKD (chronic kidney disease) stage 3, GFR 30-59 ml/min ([facility]) 03/23/2012  . Pyelonephritis 05/09/2012  . Hypokalemia 05/09/2012  . Sepsis due to urinary tract infection ([place]) 05/09/2012  . Hyponatremia 05/09/2012  . Leukocytosis, unspecified 05/09/2012  . Diarrhea 05/11/2012  . History of gout 06/14/2012  . BPH (benign prostatic hypertrophy) with urinary obstruction 07/03/2012  . Osteoarthritis of both knees 12/01/2012  . Osteoarthritis of both shoulders 07/15/2013  . Colon polyp 04/17/2014  . Nonspecific abnormal electrocardiogram (ECG) (EKG) 05/02/2014  . Abnormal ECG 06/14/2014  . Primary osteoarthritis of both knees 01/18/2015  . Elevated fasting glucose 06/08/2015  . Prediabetes 06/22/2015  . Primary osteoarthritis of left knee 07/10/2015  . S/P left TKA 08/21/2015  . Overweight (BMI 25.0-29.9) 08/22/2015  . Acute idiopathic gout of right knee 07/16/2016  . Muscular atrophy 07/16/2016  . OA (osteoarthritis) of hip 07/16/2016  . Normocytic anemia 07/17/2016  . Decreased testosterone level 07/17/2016  . Infected sebaceous cyst 12/02/2016  . Loss of appetite 12/08/2016   Resolved Ambulatory Problems    Diagnosis  Date Noted  . History of elevated PSA 01/20/2012  . Osteoarthritis of both knees 01/20/2012  . Enlarged prostate 02/23/2012  . CKD (chronic kidney disease) 03/23/2012  . S/P knee replacement 08/21/2015   Past Medical History:  Diagnosis Date  . Acid reflux OCCASIONALLY TAKE TUMS  . BPH (benign prostatic hypertrophy)   . Complication of anesthesia   . Elevated PSA   . Gross hematuria   . Hydronephrosis, left   . Hyperlipidemia   . Hypertension   . OA (osteoarthritis) knees  . PONV (postoperative nausea and vomiting)   . Tinnitus   . Wears glasses      Review of Systems  All other systems reviewed and are negative.      Objective:   Physical Exam  Constitutional: He is oriented to person, place, and time. He appears well-developed and well-nourished.  HENT:  Head: Normocephalic and atraumatic.  Cardiovascular: Normal rate, regular rhythm and normal heart sounds.  Pulmonary/Chest: Effort normal and breath sounds normal. He has no wheezes. He exhibits no tenderness.  Neurological: He is alert and oriented to person, place, and time.  Psychiatric: He has a normal mood and affect. His behavior is normal.          Assessment & Plan:  [name] KitchenMarland KitchenDiagnoses and all orders for this visit:  Atypical chest pain -     EKG 12-Lead -     Troponin I -     CK total and CKMB (cardiac)not at [facility]  Pure hypercholesterolemia -  Pitavastatin Calcium 4 MG TABS; Take 1 tablet (4 mg total) daily by mouth.   EKG- NSR 91. Unchanged from previous EKG. No ST depression or elevation.   I do not suspect cardiac involvement. However, I will get a stat troponin/CKMB to make sure no sign of ischemia. Pain has resolved and pt did eat some chilli the night before and could be some indigestion or could be some costochondritis from his residual cough that is improving. If continues to have pain could follow up with stress test. Discussed with any chest pain ASA 325mg  is indicated. Ice chest tonight.    LDL was 178 and was told to start back on livalo. Pt did not do so. I reminded him of risk of high cholesterol and heart health. He agrees to restart livalo.   [name] KitchenSpent 30 minutes with patient and greater than 50 percent of visit spent counseling patient regarding treatment plan.

## 2017-02-04 NOTE — Patient Instructions (Signed)

## 2017-02-04 NOTE — Progress Notes (Signed)
ek 

## 2017-02-06 ENCOUNTER — Other Ambulatory Visit: Payer: Self-pay | Admitting: Physician Assistant

## 2017-06-18 DIAGNOSIS — H524 Presbyopia: Secondary | ICD-10-CM | POA: Diagnosis not present

## 2017-06-18 DIAGNOSIS — H40013 Open angle with borderline findings, low risk, bilateral: Secondary | ICD-10-CM | POA: Diagnosis not present

## 2017-08-25 ENCOUNTER — Other Ambulatory Visit: Payer: Self-pay | Admitting: *Deleted

## 2017-08-25 MED ORDER — AMLODIPINE BESYLATE 10 MG PO TABS: 10.0000 mg | ORAL_TABLET | Freq: Every day | ORAL | 5 refills | Status: DC

## 2017-09-17 ENCOUNTER — Other Ambulatory Visit: Payer: Self-pay | Admitting: *Deleted

## 2017-09-17 DIAGNOSIS — E7800 Pure hypercholesterolemia, unspecified: Secondary | ICD-10-CM

## 2017-09-17 NOTE — Progress Notes (Signed)
error 

## 2017-12-16 ENCOUNTER — Ambulatory Visit (INDEPENDENT_AMBULATORY_CARE_PROVIDER_SITE_OTHER): Payer: Medicare Other | Admitting: Physician Assistant

## 2017-12-16 ENCOUNTER — Encounter: Payer: Self-pay | Admitting: Physician Assistant

## 2017-12-16 VITALS — BP 133/66 | HR 89 | Wt 206.0 lb

## 2017-12-16 DIAGNOSIS — N183 Chronic kidney disease, stage 3 unspecified: Secondary | ICD-10-CM

## 2017-12-16 DIAGNOSIS — E782 Mixed hyperlipidemia: Secondary | ICD-10-CM

## 2017-12-16 DIAGNOSIS — D126 Benign neoplasm of colon, unspecified: Secondary | ICD-10-CM | POA: Diagnosis not present

## 2017-12-16 DIAGNOSIS — Z23 Encounter for immunization: Secondary | ICD-10-CM | POA: Diagnosis not present

## 2017-12-16 DIAGNOSIS — Z1211 Encounter for screening for malignant neoplasm of colon: Secondary | ICD-10-CM | POA: Diagnosis not present

## 2017-12-16 DIAGNOSIS — I10 Essential (primary) hypertension: Secondary | ICD-10-CM | POA: Diagnosis not present

## 2017-12-16 MED ORDER — AMBULATORY NON FORMULARY MEDICATION: 0 refills | Status: DC

## 2017-12-16 MED ORDER — AMLODIPINE BESYLATE 10 MG PO TABS: 10.0000 mg | ORAL_TABLET | Freq: Every day | ORAL | 11 refills | Status: DC

## 2017-12-16 MED ORDER — PITAVASTATIN CALCIUM 4 MG PO TABS: 1.0000 | ORAL_TABLET | Freq: Every day | ORAL | 11 refills | Status: DC

## 2017-12-16 NOTE — Progress Notes (Signed)
Subjective:    Patient ID: Albert Hicks, male    DOB: 1940-11-23, 77 y.o.   MRN: [id number]  HPI  Patient is a 77 year old male with hyperlipidemia, CKD, hypertension, history of colon polyps who presents to the clinic for follow-up.  He needs refills on his medications.  He is exercising at least 3-4 times a week.  He is very active.  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  He has no complaints today.  He is doing really well with his mobilization after his knee replacement.  He is taking to [facility] for pain due to his CKD and not being able to take anti-inflammatories.  . Active Ambulatory Problems    Diagnosis Date Noted  . Hyperlipidemia   . Hypertension   . Obstruction of kidney 02/23/2012  . CKD (chronic kidney disease) stage 3, GFR 30-59 ml/min (HCC) 03/23/2012  . Hypokalemia 05/09/2012  . Hyponatremia 05/09/2012  . Leukocytosis, unspecified 05/09/2012  . History of gout 06/14/2012  . BPH (benign prostatic hypertrophy) with urinary obstruction 07/03/2012  . Osteoarthritis of both knees 12/01/2012  . Osteoarthritis of both shoulders 07/15/2013  . Colon polyp 04/17/2014  . Nonspecific abnormal electrocardiogram (ECG) (EKG) 05/02/2014  . Abnormal ECG 06/14/2014  . Primary osteoarthritis of both knees 01/18/2015  . Elevated fasting glucose 06/08/2015  . Prediabetes 06/22/2015  . Primary osteoarthritis of left knee 07/10/2015  . S/P left TKA 08/21/2015  . Overweight (BMI 25.0-29.9) 08/22/2015  . Acute idiopathic gout of right knee 07/16/2016  . Muscular atrophy 07/16/2016  . OA (osteoarthritis) of hip 07/16/2016  . Normocytic anemia 07/17/2016  . Decreased testosterone level 07/17/2016   Resolved Ambulatory Problems    Diagnosis Date Noted  . History of elevated PSA 01/20/2012  . Osteoarthritis of both knees 01/20/2012  . Enlarged prostate 02/23/2012  . CKD (chronic kidney disease) 03/23/2012  . Pyelonephritis 05/09/2012  . Sepsis due to urinary tract infection  ([place]) 05/09/2012  . Diarrhea 05/11/2012  . S/P knee replacement 08/21/2015  . Infected sebaceous cyst 12/02/2016  . Loss of appetite 12/08/2016   Past Medical History:  Diagnosis Date  . Acid reflux OCCASIONALLY TAKE TUMS  . BPH (benign prostatic hypertrophy)   . Complication of anesthesia   . Elevated PSA   . Gross hematuria   . Hydronephrosis, left   . OA (osteoarthritis) knees  . PONV (postoperative nausea and vomiting)   . Tinnitus   . Wears glasses       Review of Systems  All other systems reviewed and are negative.      Objective:   Physical Exam  Constitutional: He is oriented to person, place, and time. He appears well-developed and well-nourished.  HENT:  Head: Normocephalic and atraumatic.  Cardiovascular: Normal rate and regular rhythm.  Pulmonary/Chest: Effort normal and breath sounds normal.  Abdominal: Soft. Bowel sounds are normal.  Neurological: He is alert and oriented to person, place, and time.  Psychiatric: He has a normal mood and affect. His behavior is normal.          Assessment & Plan:  [name] KitchenMarland KitchenDiagnoses and all orders for this visit:  Mixed hyperlipidemia -     Lipid Panel w/reflex Direct LDL -     Pitavastatin Calcium 4 MG TABS; Take 1 tablet (4 mg total) by mouth daily.  CKD (chronic kidney disease) stage 3, GFR 30-59 ml/min (HCC) -     COMPLETE METABOLIC PANEL WITH GFR  Need for pneumococcal vaccination -  Pneumococcal polysaccharide vaccine 23-valent greater than or equal to 2yo subcutaneous/IM  Adenomatous polyp of colon, unspecified part of colon -     Ambulatory referral to Gastroenterology  Colon cancer screening -     Ambulatory referral to Gastroenterology  Essential hypertension -     amLODipine (NORVASC) 10 MG tablet; Take 1 tablet (10 mg total) by mouth daily.  Need for shingles vaccine -     AMBULATORY NON FORMULARY MEDICATION; shingrx 2 doses to prevent shingles.  BP looks great today. norvasc refilled.  Labs  ordered for follow up.  Pneumonia shot given today.  Discussed shingles and gave rx to have done at pharmacy.   Due for colonoscopy due to hx of polyps. Sent referral.

## 2017-12-16 NOTE — Patient Instructions (Signed)

## 2017-12-17 DIAGNOSIS — N183 Chronic kidney disease, stage 3 (moderate): Secondary | ICD-10-CM | POA: Diagnosis not present

## 2017-12-17 DIAGNOSIS — E782 Mixed hyperlipidemia: Secondary | ICD-10-CM | POA: Diagnosis not present

## 2017-12-17 LAB — COMPLETE METABOLIC PANEL WITH GFR
AG Ratio: 1.3 (calc) (ref 1.0–2.5)
ALT: 12 U/L (ref 9–46)
AST: 16 U/L (ref 10–35)
Albumin: 4.2 g/dL (ref 3.6–5.1)
Alkaline phosphatase (APISO): 58 U/L (ref 40–115)
BUN/Creatinine Ratio: 9 (calc) (ref 6–22)
BUN: 12 mg/dL (ref 7–25)
CO2: 25 mmol/L (ref 20–32)
Calcium: 9.5 mg/dL (ref 8.6–10.3)
Chloride: 107 mmol/L (ref 98–110)
Creat: 1.27 mg/dL — ABNORMAL HIGH (ref 0.70–1.18)
GFR, Est African American: 63 mL/min/{1.73_m2} (ref >=60)
GFR, Est Non African American: 55 mL/min/{1.73_m2} — ABNORMAL LOW (ref >=60)
Globulin: 3.2 g/dL (calc) (ref 1.9–3.7)
Glucose, Bld: 103 mg/dL — ABNORMAL HIGH (ref 65–99)
Potassium: 4.5 mmol/L (ref 3.5–5.3)
Sodium: 140 mmol/L (ref 135–146)
Total Bilirubin: 0.7 mg/dL (ref 0.2–1.2)
Total Protein: 7.4 g/dL (ref 6.1–8.1)

## 2017-12-17 LAB — LIPID PANEL W/REFLEX DIRECT LDL
Cholesterol: 233 mg/dL — ABNORMAL HIGH (ref <200)
HDL: 68 mg/dL (ref >40)
LDL Cholesterol (Calc): 145 mg/dL (calc) — ABNORMAL HIGH
Non-HDL Cholesterol (Calc): 165 mg/dL (calc) — ABNORMAL HIGH (ref <130)
Total CHOL/HDL Ratio: 3.4 (calc) (ref <5.0)
Triglycerides: 92 mg/dL (ref <150)

## 2017-12-18 NOTE — Progress Notes (Signed)
Call pt: kidney function is stable. Liver looks great. LDL still a little elevated but continue to take livalo. HDL wonderful!

## 2017-12-22 DIAGNOSIS — H40013 Open angle with borderline findings, low risk, bilateral: Secondary | ICD-10-CM | POA: Diagnosis not present

## 2018-02-09 DIAGNOSIS — Z86010 Personal history of colonic polyps: Secondary | ICD-10-CM | POA: Diagnosis not present

## 2018-02-09 DIAGNOSIS — D123 Benign neoplasm of transverse colon: Secondary | ICD-10-CM | POA: Diagnosis not present

## 2018-02-09 DIAGNOSIS — D122 Benign neoplasm of ascending colon: Secondary | ICD-10-CM | POA: Diagnosis not present

## 2018-02-09 DIAGNOSIS — D125 Benign neoplasm of sigmoid colon: Secondary | ICD-10-CM | POA: Diagnosis not present

## 2018-02-09 LAB — HM COLONOSCOPY

## 2018-02-11 ENCOUNTER — Other Ambulatory Visit: Payer: Self-pay

## 2018-02-24 NOTE — Progress Notes (Signed)
Subjective:   Albert Hicks is a 77 y.o. male who presents for Medicare Annual/Subsequent preventive examination.  Review of Systems:  No ROS.  Medicare Wellness Visit. Additional risk factors are reflected in the social history.  Cardiac Risk Factors include: dyslipidemia;hypertension;advanced age (>81men, >27 women);male gender  Sleep patterns:  Getting 5 hours of sleep a night. Upon when he wakes he feels rested. Wakes up 1 time to go to the bathroom. Home Safety/Smoke Alarms: Feels safe in home. Smoke alarms in place.  Living environment; Lives with girlfriend in 2 story home. Steps have handrails on them. SHower is a step over shower and no grab bars in place. Seat Belt Safety/Bike Helmet: Wears seat belt.   Male:   CCS-  Had done Nov. 16th at [facility]. Normal   PSA- offered pt to enter lab and pt declined. Lab Results  Component Value Date   PSA 5.12 (H) 06/06/2015        Objective:    Vitals: BP 140/60 Comment: manually  Pulse 78   Ht 6\' 1"  (1.854 m)   Wt 217 lb (98.4 kg)   SpO2 99%   BMI 28.63 kg/m   Body mass index is 28.63 kg/m.  Advanced Directives 03/02/2018 09/17/2016 09/12/2016 08/24/2015 08/21/2015 08/14/2015 05/02/2014  Does Patient Have a Medical Advance Directive? No No No No No No No  Would patient like information on creating a medical advance directive? No - Patient declined No - Patient declined No - Patient declined No - patient declined information No - patient declined information No - patient declined information Yes - Educational materials given  Pre-existing out of facility DNR order (yellow form or pink MOST form) - - - - - - -    Tobacco Social History   Tobacco Use  Smoking Status Never Smoker  Smokeless Tobacco Never Used     Counseling given: Not Answered   Clinical Intake:  Pre-visit preparation completed: Yes  Pain : No/denies pain     Nutritional Risks: None Diabetes: No  How often do you need to have someone help you  when you read instructions, pamphlets, or other written materials from your doctor or pharmacy?: 1 - Never What is the last grade level you completed in school?: 18  Interpreter Needed?: No  Information entered by :: Orlie Dakin  Past Medical History:  Diagnosis Date  . Acid reflux OCCASIONALLY TAKE TUMS  . BPH (benign prostatic hypertrophy)   . Complication of anesthesia   . Elevated PSA   . Gross hematuria   . Hydronephrosis, left   . Hyperlipidemia   . Hypertension   . OA (osteoarthritis) knees  . PONV (postoperative nausea and vomiting)   . Tinnitus   . Wears glasses    Past Surgical History:  Procedure Laterality Date  . APPENDECTOMY  AGE 53  . CATARACT EXTRACTION W/ INTRAOCULAR LENS IMPLANT  2013   RIGHT EYE  . CYSTOSCOPY  02/13/2012   Procedure: CYSTOSCOPY FLEXIBLE;  Surgeon: Hanley Ben, MD;  Location: [facility];  Service: Urology;  Laterality: N/A;  . LEFT KNEE SURGERY  1993  (APPROX)  . PROSTATECTOMY N/A 08/03/2012   Procedure: SIMPLE RETROPUBIC PROSTATECTOMY, removal of left double J stent. ;  Surgeon: Hanley Ben, MD;  Location: [facility];  Service: Urology;  Laterality: N/A;  . TOTAL HIP ARTHROPLASTY  07-09-2005  DR ALUISIO   RIGHT HIP OA  . TOTAL HIP ARTHROPLASTY Left 09/17/2016   Procedure: LEFT TOTAL HIP ARTHROPLASTY ANTERIOR APPROACH;  Surgeon: Gaynelle Arabian, MD;  Location: [facility];  Service: Orthopedics;  Laterality: Left;  . TOTAL KNEE ARTHROPLASTY Left 08/21/2015   Procedure: LEFT TOTAL KNEE ARTHROPLASTY;  Surgeon: Paralee Cancel, MD;  Location: [facility];  Service: Orthopedics;  Laterality: Left;   Family History  Problem Relation Age of Onset  . Hypertension Mother   . Cancer Father        prostate  . Hypertension Father   . Diabetes Father    Social History   Socioeconomic History  . Marital status: Significant Other    Spouse name: Helene Kelp  . Number of children: 5  . Years of education: 3  . Highest education level: Master's  degree (e.g., MA, MS, MEng, MEd, MSW, MBA)  Occupational History  . Occupation: retired    Comment: Art therapist  Social Needs  . Financial resource strain: Not hard at all  . Food insecurity:    Worry: Never true    Inability: Never true  . Transportation needs:    Medical: No    Non-medical: No  Tobacco Use  . Smoking status: Never Smoker  . Smokeless tobacco: Never Used  Substance and Sexual Activity  . Alcohol use: Yes    Alcohol/week: 2.0 standard drinks    Types: 2 Glasses of wine per week    Comment: 2 glasses wine per night  . Drug use: No  . Sexual activity: Yes  Lifestyle  . Physical activity:    Days per week: 3 days    Minutes per session: 90 min  . Stress: Not at all  Relationships  . Social connections:    Talks on phone: Three times a week    Gets together: Once a week    Attends religious service: More than 4 times per year    Active member of club or organization: No    Attends meetings of clubs or organizations: Never    Relationship status: Living with partner  Other Topics Concern  . Not on file  Social History Narrative   Exercises 3 times a week. Very active lifestyle. 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    Outpatient Encounter Medications as of 03/02/2018  Medication Sig  . acetaminophen (TYLENOL) 650 MG CR tablet Take 1,300 mg by mouth every 8 (eight) hours as needed for pain.  [name] Kitchen amLODipine (NORVASC) 10 MG tablet Take 1 tablet (10 mg total) by mouth daily.  . Misc Natural Products (TUMERSAID PO) Take 1 tablet by mouth daily.  . Pitavastatin Calcium 4 MG TABS Take 1 tablet (4 mg total) by mouth daily.  [name] Glycol-Propyl Glycol (SYSTANE OP) Place 1-2 drops into both eyes daily as needed (For dry eyes.).  [name] Kitchen AMBULATORY NON FORMULARY MEDICATION shingrx 2 doses to prevent shingles. (Patient not taking: Reported on 03/02/2018)  . colchicine 0.6 MG tablet Take 1 tablet (0.6 mg total) by mouth daily. (Patient not  taking: Reported on 03/02/2018)   No facility-administered encounter medications on file as of 03/02/2018.     Activities of Daily Living In your present state of health, do you have any difficulty performing the following activities: 03/02/2018  Hearing? Y  Comment patient has noticed a decline in hearing. More so in the right ear. Not to the point of needing hearing aids.  Vision? N  Difficulty concentrating or making decisions? N  Walking or climbing stairs? N  Dressing or bathing? N  Doing errands, shopping? N  Preparing Food and eating ? N  Using the Toilet? N  In the past six months, have you accidently leaked urine? N  Do you have problems with loss of bowel control? N  Managing your Medications? N  Managing your Finances? N  Some recent data might be hidden    Patient Care Team: Lavada Mesi as PCP - General (Family Medicine)   Assessment:   This is a routine wellness examination for Albert Hicks.Physical assessment deferred to PCP.   Exercise Activities and Dietary recommendations Current Exercise Habits: Structured exercise class, Type of exercise: treadmill;strength training/weights;stretching, Time (Minutes): > 60, Frequency (Times/Week): 3, Weekly Exercise (Minutes/Week): 0, Intensity: Moderate, Exercise limited by: None identified Diet Eats a healthy diet. Steams veggies, fruits. Eats a lot of fish and chicken. Breakfast: Eggs and potatoes on days works out. All other days eats a bagel. Lunch: skips Dinner:  Meat, vegetables steamed. Watches salt intake.Drinks water daily but states he could drink more     Goals    . DIET - INCREASE WATER INTAKE     Increase water to 64 ounces if can.       Fall Risk Fall Risk  03/02/2018 12/01/2016 02/28/2016  Falls in the past year? 0 No No  Comment - - Emmi Telephone Survey: data to providers prior to load  Follow up Falls prevention discussed - -   Is the patient's home free of loose throw rugs in walkways, pet  beds, electrical cords, etc?   yes      Grab bars in the bathroom? no      Handrails on the stairs?   yes      Adequate lighting?   yes  Depression Screen PHQ 2/9 Scores 03/02/2018 12/08/2016 12/01/2016  PHQ - 2 Score 0 0 0    Cognitive Function     6CIT Screen 03/02/2018  What Year? 0 points  What month? 0 points  What time? 0 points  Count back from 20 0 points  Months in reverse 0 points  Repeat phrase 0 points  Total Score 0    Immunization History  Administered Date(s) Administered  . Pneumococcal Conjugate-13 06/06/2015  . Pneumococcal Polysaccharide-23 12/16/2017  . Tdap 06/06/2015    Screening Tests Health Maintenance  Topic Date Due  . COLONOSCOPY  04/13/2017  . INFLUENZA VACCINE  12/17/2018 (Originally 10/22/2017)  . TETANUS/TDAP  06/05/2025  . PNA vac Low Risk Adult  Completed        Plan:      Albert Hicks , Thank you for taking time to come for your Medicare Wellness Visit. I appreciate your ongoing commitment to your health goals. Please review the following plan we discussed and let me know if I can assist you in the future.  Please schedule your next medicare wellness visit with me in 1 yr. Continue doing brain stimulating activities (puzzles, reading, adult coloring books, staying active) to keep memory sharp.    These are the goals we discussed: Goals    . DIET - INCREASE WATER INTAKE     Increase water to 64 ounces if can.       This is a list of the screening recommended for you and due dates:  Health Maintenance  Topic Date Due  . Colon Cancer Screening  04/13/2017  . Flu Shot  12/17/2018*  . Tetanus Vaccine  06/05/2025  . Pneumonia vaccines  Completed  *Topic was postponed. The date shown is not the original due date.     I have personally reviewed and noted  the following in the patient's chart:   . Medical and social history . Use of alcohol, tobacco or illicit drugs  . Current medications and supplements . Functional ability and  status . Nutritional status . Physical activity . Advanced directives . List of other physicians . Hospitalizations, surgeries, and ER visits in previous 12 months . Vitals . Screenings to include cognitive, depression, and falls . Referrals and appointments  In addition, I have reviewed and discussed with patient certain preventive protocols, quality metrics, and best practice recommendations. A written personalized care plan for preventive services as well as general preventive health recommendations were provided to patient.     Joanne Chars, LPN  04/23/4386

## 2018-03-02 ENCOUNTER — Ambulatory Visit (INDEPENDENT_AMBULATORY_CARE_PROVIDER_SITE_OTHER): Payer: Medicare Other | Admitting: *Deleted

## 2018-03-02 VITALS — BP 140/60 | HR 78 | Ht 73.0 in | Wt 217.0 lb

## 2018-03-02 DIAGNOSIS — Z0000 Encounter for general adult medical examination without abnormal findings: Secondary | ICD-10-CM | POA: Diagnosis not present

## 2018-03-02 NOTE — Patient Instructions (Signed)
Mr. Cafiero , Thank you for taking time to come for your Medicare Wellness Visit. I appreciate your ongoing commitment to your health goals. Please review the following plan we discussed and let me know if I can assist you in the future.  Please schedule your next medicare wellness visit with me in 1 yr. Continue doing brain stimulating activities (puzzles, reading, adult coloring books, staying active) to keep memory sharp.  These are the goals we discussed: Goals    . DIET - INCREASE WATER INTAKE     Increase water to 64 ounces if can.

## 2018-03-11 ENCOUNTER — Encounter: Payer: Self-pay | Admitting: Physician Assistant

## 2018-08-03 ENCOUNTER — Telehealth: Payer: Self-pay | Admitting: Neurology

## 2018-08-03 NOTE — Telephone Encounter (Signed)
Received note from Sobieski for medication non-adherence. Medication: Livalo Fill dates for 30 day supplies: 07/13/2018, 05/30/2018, 04/24/2018, 03/27/2018, and 02/07/2018.  Called patient to see if he is taking medication or having any issues with it. He states he takes it as directed every day. He picks it up every month and having no issues with medication. He received the same letter and was confused. He will contact us if needed.

## 2018-09-01 DIAGNOSIS — H1850 Unspecified hereditary corneal dystrophies: Secondary | ICD-10-CM | POA: Diagnosis not present

## 2018-09-01 DIAGNOSIS — H524 Presbyopia: Secondary | ICD-10-CM | POA: Diagnosis not present

## 2018-09-01 DIAGNOSIS — H35373 Puckering of macula, bilateral: Secondary | ICD-10-CM | POA: Diagnosis not present

## 2018-10-04 ENCOUNTER — Encounter: Payer: Self-pay | Admitting: Physician Assistant

## 2018-10-04 ENCOUNTER — Ambulatory Visit (INDEPENDENT_AMBULATORY_CARE_PROVIDER_SITE_OTHER): Payer: Medicare Other | Admitting: Physician Assistant

## 2018-10-04 ENCOUNTER — Other Ambulatory Visit: Payer: Self-pay

## 2018-10-04 VITALS — BP 141/71 | HR 85 | Temp 98.7°F | Ht 73.0 in | Wt 214.0 lb

## 2018-10-04 DIAGNOSIS — E782 Mixed hyperlipidemia: Secondary | ICD-10-CM | POA: Diagnosis not present

## 2018-10-04 DIAGNOSIS — R739 Hyperglycemia, unspecified: Secondary | ICD-10-CM

## 2018-10-04 DIAGNOSIS — Z23 Encounter for immunization: Secondary | ICD-10-CM

## 2018-10-04 DIAGNOSIS — N183 Chronic kidney disease, stage 3 unspecified: Secondary | ICD-10-CM

## 2018-10-04 LAB — POCT GLYCOSYLATED HEMOGLOBIN (HGB A1C): Hemoglobin A1C: 5.2 % (ref 4.0–5.6)

## 2018-10-04 MED ORDER — AMBULATORY NON FORMULARY MEDICATION: 0 refills | Status: DC

## 2018-10-04 NOTE — Progress Notes (Addendum)
Subjective:    Patient ID: Albert Hicks, male    DOB: Apr 24, 1940, 78 y.o.   MRN: [id number]  HPI Pt is a 78 yo male with HTN, CKD III, hyperlipidemia, pre-diabetes who presents to the clinic for 6 month follow up.   He feels great. No problems. He is concerned about not being able to work out in the gym for last 5 months. He was lifting weights daily. Now he is not doing anything.   He just wants to make sure numbers are good. He is taking all of his medication.   .. Active Ambulatory Problems    Diagnosis Date Noted  . Hyperlipidemia   . Hypertension   . Obstruction of kidney 02/23/2012  . CKD (chronic kidney disease) stage 3, GFR 30-59 ml/min (HCC) 03/23/2012  . Hypokalemia 05/09/2012  . Hyponatremia 05/09/2012  . Leukocytosis, unspecified 05/09/2012  . History of gout 06/14/2012  . BPH (benign prostatic hypertrophy) with urinary obstruction 07/03/2012  . Osteoarthritis of both knees 12/01/2012  . Osteoarthritis of both shoulders 07/15/2013  . Colon polyp 04/17/2014  . Nonspecific abnormal electrocardiogram (ECG) (EKG) 05/02/2014  . Abnormal ECG 06/14/2014  . Primary osteoarthritis of both knees 01/18/2015  . Elevated blood sugar 06/08/2015  . Prediabetes 06/22/2015  . Primary osteoarthritis of left knee 07/10/2015  . S/P left TKA 08/21/2015  . Overweight (BMI 25.0-29.9) 08/22/2015  . Acute idiopathic gout of right knee 07/16/2016  . Muscular atrophy 07/16/2016  . OA (osteoarthritis) of hip 07/16/2016  . Normocytic anemia 07/17/2016  . Decreased testosterone level 07/17/2016   Resolved Ambulatory Problems    Diagnosis Date Noted  . History of elevated PSA 01/20/2012  . Osteoarthritis of both knees 01/20/2012  . Enlarged prostate 02/23/2012  . CKD (chronic kidney disease) 03/23/2012  . Pyelonephritis 05/09/2012  . Sepsis due to urinary tract infection ([place]) 05/09/2012  . Diarrhea 05/11/2012  . S/P knee replacement 08/21/2015  . Infected sebaceous cyst 12/02/2016   . Loss of appetite 12/08/2016   Past Medical History:  Diagnosis Date  . Acid reflux OCCASIONALLY TAKE TUMS  . BPH (benign prostatic hypertrophy)   . Complication of anesthesia   . Elevated PSA   . Gross hematuria   . Hydronephrosis, left   . OA (osteoarthritis) knees  . PONV (postoperative nausea and vomiting)   . Tinnitus   . Wears glasses       Review of Systems  All other systems reviewed and are negative.      Objective:   Physical Exam Vitals signs reviewed.  Constitutional:      Appearance: Normal appearance.  HENT:     Head: Normocephalic.  Neck:     Vascular: No carotid bruit.  Cardiovascular:     Rate and Rhythm: Normal rate and regular rhythm.     Pulses: Normal pulses.     Heart sounds: No murmur.  Pulmonary:     Effort: Pulmonary effort is normal.     Breath sounds: Normal breath sounds.  Neurological:     General: No focal deficit present.     Mental Status: He is alert and oriented to person, place, and time.  Psychiatric:        Mood and Affect: Mood normal.        Behavior: Behavior normal.           Assessment & Plan:  [name] KitchenMarland KitchenDiquan was seen today for hyperlipidemia and hypertension.  Diagnoses and all orders for this visit:  Elevated blood sugar -  POCT glycosylated hemoglobin (Hb A1C) -     COMPLETE METABOLIC PANEL WITH GFR  Mixed hyperlipidemia -     Lipid Panel w/reflex Direct LDL  CKD (chronic kidney disease) stage 3, GFR 30-59 ml/min (HCC) -     COMPLETE METABOLIC PANEL WITH GFR  Need for shingles vaccine -     AMBULATORY NON FORMULARY MEDICATION; shingrx 2 doses to prevent shingles.  .. Results for orders placed or performed in visit on 10/04/18  POCT glycosylated hemoglobin (Hb A1C)  Result Value Ref Range   Hemoglobin A1C 5.2 4.0 - 5.6 %   HbA1c POC (<> result, manual entry)     HbA1c, POC (prediabetic range)     HbA1c, POC (controlled diabetic range)     A!C looks great. No more pre-diabetes. Labs ordered.  No  need for medication refills at this point.  Need for shingrix. Given HO. Discussed risk vs benefit.  rx given to have done at pharmacy.  BP at  Goal almost to goal 140/90.   Discussed exercises outside of gym to maintain muscle and keep healthy.   Follow up in 6 months.

## 2018-10-05 DIAGNOSIS — R739 Hyperglycemia, unspecified: Secondary | ICD-10-CM | POA: Diagnosis not present

## 2018-10-05 DIAGNOSIS — N183 Chronic kidney disease, stage 3 (moderate): Secondary | ICD-10-CM | POA: Diagnosis not present

## 2018-10-05 DIAGNOSIS — E782 Mixed hyperlipidemia: Secondary | ICD-10-CM | POA: Diagnosis not present

## 2018-10-05 LAB — LIPID PANEL W/REFLEX DIRECT LDL
Cholesterol: 177 mg/dL (ref <200)
HDL: 66 mg/dL (ref >=40)
LDL Cholesterol (Calc): 92 mg/dL (calc)
Non-HDL Cholesterol (Calc): 111 mg/dL (calc) (ref <130)
Total CHOL/HDL Ratio: 2.7 (calc) (ref <5.0)
Triglycerides: 93 mg/dL (ref <150)

## 2018-10-05 LAB — COMPLETE METABOLIC PANEL WITH GFR
AG Ratio: 1.2 (calc) (ref 1.0–2.5)
ALT: 24 U/L (ref 9–46)
AST: 18 U/L (ref 10–35)
Albumin: 4.2 g/dL (ref 3.6–5.1)
Alkaline phosphatase (APISO): 55 U/L (ref 35–144)
BUN/Creatinine Ratio: 10 (calc) (ref 6–22)
BUN: 13 mg/dL (ref 7–25)
CO2: 26 mmol/L (ref 20–32)
Calcium: 9.6 mg/dL (ref 8.6–10.3)
Chloride: 108 mmol/L (ref 98–110)
Creat: 1.33 mg/dL — ABNORMAL HIGH (ref 0.70–1.18)
GFR, Est African American: 59 mL/min/{1.73_m2} — ABNORMAL LOW (ref >=60)
GFR, Est Non African American: 51 mL/min/{1.73_m2} — ABNORMAL LOW (ref >=60)
Globulin: 3.5 g/dL (calc) (ref 1.9–3.7)
Glucose, Bld: 96 mg/dL (ref 65–99)
Potassium: 4.4 mmol/L (ref 3.5–5.3)
Sodium: 141 mmol/L (ref 135–146)
Total Bilirubin: 0.6 mg/dL (ref 0.2–1.2)
Total Protein: 7.7 g/dL (ref 6.1–8.1)

## 2018-10-05 NOTE — Progress Notes (Signed)
WOW. Great cholesterol!!!! Liver looks great.  Stable chronic kidney disease stage III.

## 2018-12-28 DIAGNOSIS — H524 Presbyopia: Secondary | ICD-10-CM | POA: Diagnosis not present

## 2018-12-28 DIAGNOSIS — H35373 Puckering of macula, bilateral: Secondary | ICD-10-CM | POA: Diagnosis not present

## 2019-01-10 ENCOUNTER — Other Ambulatory Visit: Payer: Self-pay | Admitting: Physician Assistant

## 2019-01-10 DIAGNOSIS — I10 Essential (primary) hypertension: Secondary | ICD-10-CM

## 2019-01-17 ENCOUNTER — Ambulatory Visit (INDEPENDENT_AMBULATORY_CARE_PROVIDER_SITE_OTHER): Payer: Medicare Other | Admitting: Physician Assistant

## 2019-01-17 ENCOUNTER — Other Ambulatory Visit: Payer: Self-pay

## 2019-01-17 VITALS — Temp 98.0°F | Ht 73.0 in | Wt 214.0 lb

## 2019-01-17 DIAGNOSIS — Z23 Encounter for immunization: Secondary | ICD-10-CM

## 2019-01-17 NOTE — Progress Notes (Signed)
Patient presents to clinic for a drive by flu shot. Patient denies any reaction in the past, denies any fever in the last 48 hours, and also denies an allergic reaction to latex or eggs. Patient reports this is the first flu shot in a few years. Patient was instructed on the potential side effects and to call the office with any questions. He did not have any other questions and voices understanding.    Agree with plan. Iran Planas PA-C

## 2019-01-19 ENCOUNTER — Ambulatory Visit: Payer: BLUE CROSS/BLUE SHIELD

## 2019-02-07 ENCOUNTER — Other Ambulatory Visit: Payer: Self-pay | Admitting: Physician Assistant

## 2019-02-07 DIAGNOSIS — I10 Essential (primary) hypertension: Secondary | ICD-10-CM

## 2019-02-07 DIAGNOSIS — E782 Mixed hyperlipidemia: Secondary | ICD-10-CM

## 2019-02-08 ENCOUNTER — Ambulatory Visit (INDEPENDENT_AMBULATORY_CARE_PROVIDER_SITE_OTHER): Payer: Medicare Other | Admitting: Physician Assistant

## 2019-02-08 ENCOUNTER — Encounter: Payer: Self-pay | Admitting: Physician Assistant

## 2019-02-08 VITALS — BP 96/70 | Ht 73.0 in | Wt 214.0 lb

## 2019-02-08 DIAGNOSIS — M10061 Idiopathic gout, right knee: Secondary | ICD-10-CM

## 2019-02-08 DIAGNOSIS — E782 Mixed hyperlipidemia: Secondary | ICD-10-CM | POA: Diagnosis not present

## 2019-02-08 DIAGNOSIS — M10479 Other secondary gout, unspecified ankle and foot: Secondary | ICD-10-CM | POA: Diagnosis not present

## 2019-02-08 DIAGNOSIS — M109 Gout, unspecified: Secondary | ICD-10-CM | POA: Insufficient documentation

## 2019-02-08 DIAGNOSIS — I10 Essential (primary) hypertension: Secondary | ICD-10-CM

## 2019-02-08 DIAGNOSIS — Z8739 Personal history of other diseases of the musculoskeletal system and connective tissue: Secondary | ICD-10-CM

## 2019-02-08 MED ORDER — LIVALO 4 MG PO TABS: 1.0000 | ORAL_TABLET | Freq: Every day | ORAL | 11 refills | Status: DC

## 2019-02-08 MED ORDER — COLCHICINE 0.6 MG PO TABS: 0.6000 mg | ORAL_TABLET | Freq: Every day | ORAL | 0 refills | Status: DC

## 2019-02-08 MED ORDER — AMLODIPINE BESYLATE 5 MG PO TABS: 5.0000 mg | ORAL_TABLET | Freq: Every day | ORAL | 11 refills | Status: DC

## 2019-02-08 NOTE — Progress Notes (Signed)
Doing well. Needs refills.  BP reading today 96/70. No lightheaded/dizziness.

## 2019-02-08 NOTE — Progress Notes (Signed)
Patient ID: Albert Hicks, male   DOB: 1940-09-02, 78 y.o.   MRN: [id number] .[name] KitchenVirtual Visit via Telephone Note  I connected with Albert Hicks on 02/08/19 at  1:40 PM EST by telephone and verified that I am speaking with the correct person using two identifiers.  Location: Patient: home Provider: clinic   I discussed the limitations, risks, security and privacy concerns of performing an evaluation and management service by telephone and the availability of in person appointments. I also discussed with the patient that there may be a patient responsible charge related to this service. The patient expressed understanding and agreed to proceed.   History of Present Illness: Pt is a 78 yo male with HTN, gout, CKD, HLD who calls into the clinic for medication refills. He is doing great. He denies any problems or concerns. He does need colchine for periodic use for gout flare in ankle. He usually takes for one or two days. He denies any CP, palpitations, headaches, dizziness.   .. Active Ambulatory Problems    Diagnosis Date Noted  . Hyperlipidemia   . Hypertension   . Obstruction of kidney 02/23/2012  . CKD (chronic kidney disease) stage 3, GFR 30-59 ml/min 03/23/2012  . Hypokalemia 05/09/2012  . Hyponatremia 05/09/2012  . Leukocytosis, unspecified 05/09/2012  . History of gout 06/14/2012  . BPH (benign prostatic hypertrophy) with urinary obstruction 07/03/2012  . Osteoarthritis of both knees 12/01/2012  . Osteoarthritis of both shoulders 07/15/2013  . Colon polyp 04/17/2014  . Nonspecific abnormal electrocardiogram (ECG) (EKG) 05/02/2014  . Abnormal ECG 06/14/2014  . Primary osteoarthritis of both knees 01/18/2015  . Elevated blood sugar 06/08/2015  . Prediabetes 06/22/2015  . Primary osteoarthritis of left knee 07/10/2015  . S/P left TKA 08/21/2015  . Overweight (BMI 25.0-29.9) 08/22/2015  . Acute idiopathic gout of right knee 07/16/2016  . Muscular atrophy 07/16/2016  . OA  (osteoarthritis) of hip 07/16/2016  . Normocytic anemia 07/17/2016  . Decreased testosterone level 07/17/2016  . Acute gout of ankle 02/08/2019   Resolved Ambulatory Problems    Diagnosis Date Noted  . History of elevated PSA 01/20/2012  . Osteoarthritis of both knees 01/20/2012  . Enlarged prostate 02/23/2012  . CKD (chronic kidney disease) 03/23/2012  . Pyelonephritis 05/09/2012  . Sepsis due to urinary tract infection ([place]) 05/09/2012  . Diarrhea 05/11/2012  . S/P knee replacement 08/21/2015  . Infected sebaceous cyst 12/02/2016  . Loss of appetite 12/08/2016   Past Medical History:  Diagnosis Date  . Acid reflux OCCASIONALLY TAKE TUMS  . BPH (benign prostatic hypertrophy)   . Complication of anesthesia   . Elevated PSA   . Gross hematuria   . Hydronephrosis, left   . OA (osteoarthritis) knees  . PONV (postoperative nausea and vomiting)   . Tinnitus   . Wears glasses    Reviewed med, allergy, problem list.     Observations/Objective: No acute distress. No labored breathing.  Normal mood.   .. Today's Vitals   02/08/19 1134  BP: 96/70  Weight: 214 lb (97.1 kg)  Height: 6\' 1"  (1.854 m)   Body mass index is 28.23 kg/m.    Assessment and Plan: [name] KitchenMarland KitchenDiagnoses and all orders for this visit:  Essential hypertension -     amLODipine (NORVASC) 5 MG tablet; Take 1 tablet (5 mg total) by mouth daily.  Mixed hyperlipidemia -     Pitavastatin Calcium (LIVALO) 4 MG TABS; Take 1 tablet (4 mg total) by mouth daily.  History of gout -  colchicine 0.6 MG tablet; Take 1 tablet (0.6 mg total) by mouth daily. For up to 3 days for gout exacerbation.  Acute idiopathic gout of right knee -     colchicine 0.6 MG tablet; Take 1 tablet (0.6 mg total) by mouth daily. For up to 3 days for gout exacerbation.  Other secondary acute gout of ankle, unspecified laterality -     colchicine 0.6 MG tablet; Take 1 tablet (0.6 mg total) by mouth daily. For up to 3 days for gout  exacerbation.   Labs UTD in the summer.  Refills sent.  BP low decreased norvasc to 5mg  daily. Monitor BP at home.  Follow up in 6 months.    Follow Up Instructions:    I discussed the assessment and treatment plan with the patient. The patient was provided an opportunity to ask questions and all were answered. The patient agreed with the plan and demonstrated an understanding of the instructions.   The patient was advised to call back or seek an in-person evaluation if the symptoms worsen or if the condition fails to improve as anticipated.  I provided 11 minutes of non-face-to-face time during this encounter.   Iran Planas, PA-C

## 2019-02-16 ENCOUNTER — Other Ambulatory Visit: Payer: Self-pay

## 2019-03-02 NOTE — Progress Notes (Signed)
Subjective:   Albert Hicks is a 78 y.o. male who presents for Medicare Annual/Subsequent preventive examination.  Review of Systems:  No ROS.  Medicare Wellness Virtual Visit.  Visual/audio telehealth visit, UTA vital signs.   See social history for additional risk factors.    Cardiac Risk Factors include: advanced age (>65men, >61 women);hypertension;male gender  Sleep patterns: Getting 6-7 hours of sleep a night. Does not wake up to void during the night. Wakes up and feels rested and ready for the day.   Home Safety/Smoke Alarms: Feels safe in home. Smoke alarms in place.  Living environment; Lives with significant other in 2 story home. Stairs have hand rails on them. Shower is a step over tub combo with anti slip mat in place. Seat Belt Safety/Bike Helmet: Wears seat belt.    Male:   CCS- UTD     PSA- patient declined test Lab Results  Component Value Date   PSA 5.12 (H) 06/06/2015        Objective:    Vitals: BP 131/68   Temp (!) 97 F (36.1 C) (Oral)   Ht 6\' 1"  (1.854 m)   Wt 214 lb (97.1 kg)   BMI 28.23 kg/m   Body mass index is 28.23 kg/m.  Advanced Directives 03/08/2019 03/02/2018 09/17/2016 09/12/2016 08/24/2015 08/21/2015 08/14/2015  Does Patient Have a Medical Advance Directive? No No No No No No No  Would patient like information on creating a medical advance directive? No - Patient declined No - Patient declined No - Patient declined No - Patient declined No - patient declined information No - patient declined information No - patient declined information  Pre-existing out of facility DNR order (yellow form or pink MOST form) - - - - - - -    Tobacco Social History   Tobacco Use  Smoking Status Never Smoker  Smokeless Tobacco Never Used     Counseling given: No   Clinical Intake:  Pre-visit preparation completed: Yes  Pain : No/denies pain     Nutritional Risks: None Diabetes: No  How often do you need to have someone help you when you read  instructions, pamphlets, or other written materials from your doctor or pharmacy?: 1 - Never What is the last grade level you completed in school?: 18  Interpreter Needed?: No  Information entered by :: Orlie Dakin, LPN  Past Medical History:  Diagnosis Date  . Acid reflux OCCASIONALLY TAKE TUMS  . BPH (benign prostatic hypertrophy)   . Complication of anesthesia   . Elevated PSA   . Gross hematuria   . Hydronephrosis, left   . Hyperlipidemia   . Hypertension   . OA (osteoarthritis) knees  . PONV (postoperative nausea and vomiting)   . Tinnitus   . Wears glasses    Past Surgical History:  Procedure Laterality Date  . APPENDECTOMY  AGE 76  . CATARACT EXTRACTION W/ INTRAOCULAR LENS IMPLANT  2013   RIGHT EYE  . CYSTOSCOPY  02/13/2012   Procedure: CYSTOSCOPY FLEXIBLE;  Surgeon: Hanley Ben, MD;  Location: [facility];  Service: Urology;  Laterality: N/A;  . LEFT KNEE SURGERY  1993  (APPROX)  . PROSTATECTOMY N/A 08/03/2012   Procedure: SIMPLE RETROPUBIC PROSTATECTOMY, removal of left double J stent. ;  Surgeon: Hanley Ben, MD;  Location: [facility];  Service: Urology;  Laterality: N/A;  . TOTAL HIP ARTHROPLASTY  07-09-2005  DR ALUISIO   RIGHT HIP OA  . TOTAL HIP ARTHROPLASTY Left 09/17/2016   Procedure:  LEFT TOTAL HIP ARTHROPLASTY ANTERIOR APPROACH;  Surgeon: Gaynelle Arabian, MD;  Location: [facility];  Service: Orthopedics;  Laterality: Left;  . TOTAL KNEE ARTHROPLASTY Left 08/21/2015   Procedure: LEFT TOTAL KNEE ARTHROPLASTY;  Surgeon: Paralee Cancel, MD;  Location: [facility];  Service: Orthopedics;  Laterality: Left;   Family History  Problem Relation Age of Onset  . Hypertension Mother   . Cancer Father        prostate  . Hypertension Father   . Diabetes Father    Social History   Socioeconomic History  . Marital status: Significant Other    Spouse name: Helene Kelp  . Number of children: 5  . Years of education: 87  . Highest education level: Master's degree  (e.g., MA, MS, MEng, MEd, MSW, MBA)  Occupational History  . Occupation: retired    Comment: Art therapist  Tobacco Use  . Smoking status: Never Smoker  . Smokeless tobacco: Never Used  Substance and Sexual Activity  . Alcohol use: Yes    Alcohol/week: 2.0 standard drinks    Types: 2 Glasses of wine per week    Comment: 2 glasses wine per night  . Drug use: No  . Sexual activity: Yes  Other Topics Concern  . Not on file  Social History Narrative   Exercises 3 times a week. Very active lifestyle. 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   Social Determinants of Health   Financial Resource Strain:   . Difficulty of Paying Living Expenses: Not on file  Food Insecurity:   . Worried About Charity fundraiser in the Last Year: Not on file  . Ran Out of Food in the Last Year: Not on file  Transportation Needs:   . Lack of Transportation (Medical): Not on file  . Lack of Transportation (Non-Medical): Not on file  Physical Activity:   . Days of Exercise per Week: Not on file  . Minutes of Exercise per Session: Not on file  Stress:   . Feeling of Stress : Not on file  Social Connections:   . Frequency of Communication with Friends and Family: Not on file  . Frequency of Social Gatherings with Friends and Family: Not on file  . Attends Religious Services: Not on file  . Active Member of Clubs or Organizations: Not on file  . Attends Archivist Meetings: Not on file  . Marital Status: Not on file    Outpatient Encounter Medications as of 03/08/2019  Medication Sig  . amLODipine (NORVASC) 5 MG tablet Take 1 tablet (5 mg total) by mouth daily.  . Pitavastatin Calcium (LIVALO) 4 MG TABS Take 1 tablet (4 mg total) by mouth daily.  [name] Glycol-Propyl Glycol (SYSTANE OP) Place 1-2 drops into both eyes daily as needed (For dry eyes.).  [name] Kitchen colchicine 0.6 MG tablet Take 1 tablet (0.6 mg total) by mouth daily. For up to 3 days for gout exacerbation.  (Patient not taking: Reported on 03/08/2019)   No facility-administered encounter medications on file as of 03/08/2019.    Activities of Daily Living In your present state of health, do you have any difficulty performing the following activities: 03/08/2019  Hearing? N  Vision? N  Difficulty concentrating or making decisions? N  Walking or climbing stairs? N  Dressing or bathing? N  Doing errands, shopping? N  Preparing Food and eating ? N  Using the Toilet? N  In the past six months, have you accidently leaked urine? N  Do you have problems with loss of bowel control? N  Managing your Medications? N  Managing your Finances? N  Housekeeping or managing your Housekeeping? N  Some recent data might be hidden    Patient Care Team: Lavada Mesi as PCP - General (Family Medicine)   Assessment:   This is a routine wellness examination for [place].Physical assessment deferred to PCP.   Exercise Activities and Dietary recommendations Current Exercise Habits: Home exercise routine, Type of exercise: strength training/weights;walking, Time (Minutes): 45, Frequency (Times/Week): 3, Weekly Exercise (Minutes/Week): 135, Intensity: Mild, Exercise limited by: None identified Diet  Eating a healthy diet of vegetables and fruit and protein. Breakfast: bacon and eggs, hashbrowns Lunch: skips Dinner: Meat and vegetables Drinks water daily.      Goals    . DIET - INCREASE WATER INTAKE     Increase water to 64 ounces if can.       Fall Risk Fall Risk  03/08/2019 03/02/2018 02/11/2018 12/01/2016 02/28/2016  Falls in the past year? 0 0 0 No No  Comment - - Emmi Telephone Survey: data to providers prior to load - Emmi Telephone Survey: data to providers prior to load  Number falls in past yr: 0 - - - -  Injury with Fall? 0 - - - -  Follow up Falls prevention discussed Falls prevention discussed - - -   Is the patient's home free of loose throw rugs in walkways, pet beds, electrical  cords, etc?   yes      Grab bars in the bathroom? no      Handrails on the stairs?   yes      Adequate lighting?   yes   Depression Screen PHQ 2/9 Scores 03/08/2019 10/04/2018 03/02/2018 12/08/2016  PHQ - 2 Score 0 0 0 0  PHQ- 9 Score - 0 - -    Cognitive Function     6CIT Screen 03/08/2019 03/02/2018  What Year? 0 points 0 points  What month? 0 points 0 points  What time? 0 points 0 points  Count back from 20 0 points 0 points  Months in reverse 0 points 0 points  Repeat phrase 0 points 0 points  Total Score 0 0    Immunization History  Administered Date(s) Administered  . Fluad Quad(high Dose 65+) 01/17/2019  . Pneumococcal Conjugate-13 06/06/2015  . Pneumococcal Polysaccharide-23 12/16/2017  . Tdap 06/06/2015    Screening Tests Health Maintenance  Topic Date Due  . COLONOSCOPY  02/09/2021  . TETANUS/TDAP  06/05/2025  . INFLUENZA VACCINE  Completed  . PNA vac Low Risk Adult  Completed        Plan:  Please schedule your next medicare wellness visit with me in 1 yr.  Mr. Mehlhorn , Thank you for taking time to come for your Medicare Wellness Visit. I appreciate your ongoing commitment to your health goals. Please review the following plan we discussed and let me know if I can assist you in the future.  Continue doing brain stimulating activities (puzzles, reading, adult coloring books, staying active) to keep memory sharp.    These are the goals we discussed: Goals    . DIET - INCREASE WATER INTAKE     Increase water to 64 ounces if can.       This is a list of the screening recommended for you and due dates:  Health Maintenance  Topic Date Due  . Colon Cancer Screening  02/09/2021  . Tetanus Vaccine  06/05/2025  .  Flu Shot  Completed  . Pneumonia vaccines  Completed     I have personally reviewed and noted the following in the patient's chart:   . Medical and social history . Use of alcohol, tobacco or illicit drugs  . Current medications and  supplements . Functional ability and status . Nutritional status . Physical activity . Advanced directives . List of other physicians . Hospitalizations, surgeries, and ER visits in previous 12 months . Vitals . Screenings to include cognitive, depression, and falls . Referrals and appointments  In addition, I have reviewed and discussed with patient certain preventive protocols, quality metrics, and best practice recommendations. A written personalized care plan for preventive services as well as general preventive health recommendations were provided to patient.     Joanne Chars, LPN  [id number]

## 2019-03-04 ENCOUNTER — Other Ambulatory Visit: Payer: Self-pay

## 2019-03-04 DIAGNOSIS — Z20822 Contact with and (suspected) exposure to covid-19: Secondary | ICD-10-CM

## 2019-03-05 LAB — NOVEL CORONAVIRUS, NAA: SARS-CoV-2, NAA: NOT DETECTED

## 2019-03-08 ENCOUNTER — Ambulatory Visit (INDEPENDENT_AMBULATORY_CARE_PROVIDER_SITE_OTHER): Payer: Medicare Other | Admitting: *Deleted

## 2019-03-08 VITALS — BP 131/68 | Temp 97.0°F | Ht 73.0 in | Wt 214.0 lb

## 2019-03-08 DIAGNOSIS — Z0000 Encounter for general adult medical examination without abnormal findings: Secondary | ICD-10-CM | POA: Diagnosis not present

## 2019-03-08 NOTE — Patient Instructions (Addendum)
Please schedule your next medicare wellness visit with me in 1 yr.  Albert Hicks , Thank you for taking time to come for your Medicare Wellness Visit. I appreciate your ongoing commitment to your health goals. Please review the following plan we discussed and let me know if I can assist you in the future.  Continue doing brain stimulating activities (puzzles, reading, adult coloring books, staying active) to keep memory sharp.   These are the goals we discussed: Goals    . DIET - INCREASE WATER INTAKE     Increase water to 64 ounces if can.

## 2019-03-09 ENCOUNTER — Telehealth: Payer: Self-pay

## 2019-03-09 NOTE — Telephone Encounter (Signed)
Caller given negative result and verbalized understanding  

## 2019-05-17 ENCOUNTER — Telehealth: Payer: Self-pay | Admitting: Neurology

## 2019-05-17 NOTE — Telephone Encounter (Signed)
Patient made aware after 14 days of getting the second Covid vaccine he will have some immunity but still a small chance of contracting Covid. Aware safe to go to gym but needs to wear a mask, stay 6 feet apart, and make sure he washes hands frequently. He expressed understanding.

## 2019-05-17 NOTE — Telephone Encounter (Signed)
Albert Hicks called stating patient had second Covid vaccine and wanted to know if it would be safe for patient to go to the gym every now and then now that he is vaccinated.

## 2019-05-19 ENCOUNTER — Encounter: Payer: Self-pay | Admitting: Family Medicine

## 2019-05-19 ENCOUNTER — Ambulatory Visit (INDEPENDENT_AMBULATORY_CARE_PROVIDER_SITE_OTHER): Payer: Medicare Other | Admitting: Family Medicine

## 2019-05-19 ENCOUNTER — Other Ambulatory Visit: Payer: Self-pay

## 2019-05-19 DIAGNOSIS — I10 Essential (primary) hypertension: Secondary | ICD-10-CM | POA: Diagnosis not present

## 2019-05-19 MED ORDER — AMLODIPINE BESYLATE 10 MG PO TABS: 10.0000 mg | ORAL_TABLET | Freq: Every day | ORAL | 1 refills | Status: DC

## 2019-05-19 NOTE — Patient Instructions (Signed)
Nice to meet you today! Please increase your amlodipine to 10mg  daily Return in about 2 weeks to have your BP checked Please call or send MyChart message with any questions.

## 2019-05-19 NOTE — Assessment & Plan Note (Signed)
Blood pressure is not at goal at for age and co-morbidities.  I recommend he increase amlodipine to 10mg  daily.  In addition they were instructed to follow a low sodium diet with regular exercise to help to maintain adequate control of blood pressure.   Return in about 2 weeks (around 06/02/2019) for Nurse visit for BP f/u.

## 2019-05-19 NOTE — Progress Notes (Signed)
Albert Hicks - 79 y.o. male MRN [id number]  Date of birth: 02/02/1941  Subjective Chief Complaint  Patient presents with  . Hypertension    HPI Albert Hicks is a 79 y.o. male with history of HTN here today for same day visit regarding concerns of elevated BP.  He reports noticing a sharp pain in L temple area last evening.  Decided to check his BP this morning and it was elevated at home.  The pain he was having has resolved.  Previously taking 10mg  of amlodipine.  He has been more sedentary as he is not using the gym as often due to [place]. He denies any other symptoms including headache, vision changes, chest pain, or shortness of breath.    ROS:  A comprehensive ROS was completed and negative except as noted per HPI  Allergies  Allergen Reactions  . Doxycycline     Itchy/rash  . Oxycodone Nausea Only    Pt states all medications with oxy medication in it causes nausea  . Lipitor [Atorvastatin] Other (See Comments)    Muscle ache  . Statins Other (See Comments)    Muscle soreness    Past Medical History:  Diagnosis Date  . Acid reflux OCCASIONALLY TAKE TUMS  . BPH (benign prostatic hypertrophy)   . Complication of anesthesia   . Elevated PSA   . Gross hematuria   . Hydronephrosis, left   . Hyperlipidemia   . Hypertension   . OA (osteoarthritis) knees  . PONV (postoperative nausea and vomiting)   . Tinnitus   . Wears glasses     Past Surgical History:  Procedure Laterality Date  . APPENDECTOMY  AGE 28  . CATARACT EXTRACTION W/ INTRAOCULAR LENS IMPLANT  2013   RIGHT EYE  . CYSTOSCOPY  02/13/2012   Procedure: CYSTOSCOPY FLEXIBLE;  Surgeon: Hanley Ben, MD;  Location: [facility];  Service: Urology;  Laterality: N/A;  . LEFT KNEE SURGERY  1993  (APPROX)  . PROSTATECTOMY N/A 08/03/2012   Procedure: SIMPLE RETROPUBIC PROSTATECTOMY, removal of left double J stent. ;  Surgeon: Hanley Ben, MD;  Location: [facility];  Service: Urology;  Laterality: N/A;  .  TOTAL HIP ARTHROPLASTY  07-09-2005  DR ALUISIO   RIGHT HIP OA  . TOTAL HIP ARTHROPLASTY Left 09/17/2016   Procedure: LEFT TOTAL HIP ARTHROPLASTY ANTERIOR APPROACH;  Surgeon: Gaynelle Arabian, MD;  Location: [facility];  Service: Orthopedics;  Laterality: Left;  . TOTAL KNEE ARTHROPLASTY Left 08/21/2015   Procedure: LEFT TOTAL KNEE ARTHROPLASTY;  Surgeon: Paralee Cancel, MD;  Location: [facility];  Service: Orthopedics;  Laterality: Left;    Social History   Socioeconomic History  . Marital status: Significant Other    Spouse name: Helene Kelp  . Number of children: 5  . Years of education: 76  . Highest education level: Master's degree (e.g., MA, MS, MEng, MEd, MSW, MBA)  Occupational History  . Occupation: retired    Comment: Art therapist  Tobacco Use  . Smoking status: Never Smoker  . Smokeless tobacco: Never Used  Substance and Sexual Activity  . Alcohol use: Yes    Alcohol/week: 2.0 standard drinks    Types: 2 Glasses of wine per week    Comment: 2 glasses wine per night  . Drug use: No  . Sexual activity: Yes  Other Topics Concern  . Not on file  Social History Narrative   Exercises 3 times a week. Very active lifestyle. 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  Social Determinants of Health   Financial Resource Strain:   . Difficulty of Paying Living Expenses: Not on file  Food Insecurity:   . Worried About Charity fundraiser in the Last Year: Not on file  . Ran Out of Food in the Last Year: Not on file  Transportation Needs:   . Lack of Transportation (Medical): Not on file  . Lack of Transportation (Non-Medical): Not on file  Physical Activity:   . Days of Exercise per Week: Not on file  . Minutes of Exercise per Session: Not on file  Stress:   . Feeling of Stress : Not on file  Social Connections:   . Frequency of Communication with Friends and Family: Not on file  . Frequency of Social Gatherings with Friends and Family: Not on file  . Attends  Religious Services: Not on file  . Active Member of Clubs or Organizations: Not on file  . Attends Archivist Meetings: Not on file  . Marital Status: Not on file    Family History  Problem Relation Age of Onset  . Hypertension Mother   . Cancer Father        prostate  . Hypertension Father   . Diabetes Father     Health Maintenance  Topic Date Due  . COLONOSCOPY  02/09/2021  . TETANUS/TDAP  06/05/2025  . INFLUENZA VACCINE  Completed  . PNA vac Low Risk Adult  Completed     ----------------------------------------------------------------------------------------------------------------------------------------------------------------------------------------------------------------- Physical Exam BP (!) 174/89   Pulse 100   Temp 98 F (36.7 C) (Oral)   Ht 6\' 1"  (1.854 m)   Wt 216 lb (98 kg)   BMI 28.50 kg/m   Physical Exam Constitutional:      Appearance: Normal appearance.  HENT:     Head: Normocephalic and atraumatic.     Mouth/Throat:     Mouth: Mucous membranes are moist.  Eyes:     General: No scleral icterus. Cardiovascular:     Rate and Rhythm: Normal rate and regular rhythm.  Pulmonary:     Effort: Pulmonary effort is normal.     Breath sounds: Normal breath sounds.  Skin:    General: Skin is warm and dry.  Neurological:     General: No focal deficit present.     Mental Status: He is alert. Mental status is at baseline.     Cranial Nerves: No cranial nerve deficit.     Motor: No weakness.  Psychiatric:        Mood and Affect: Mood normal.        Behavior: Behavior normal.     ------------------------------------------------------------------------------------------------------------------------------------------------------------------------------------------------------------------- Assessment and Plan  Hypertension Blood pressure is not at goal at for age and co-morbidities.  I recommend he increase amlodipine to 10mg  daily.  In  addition they were instructed to follow a low sodium diet with regular exercise to help to maintain adequate control of blood pressure.   Return in about 2 weeks (around 06/02/2019) for Nurse visit for BP f/u.     Meds ordered this encounter  Medications  . amLODipine (NORVASC) 10 MG tablet    Sig: Take 1 tablet (10 mg total) by mouth daily.    Dispense:  90 tablet    Refill:  1    Return in about 2 weeks (around 06/02/2019) for Nurse visit for BP f/u.    This visit occurred during the SARS-CoV-2 public health emergency.  Safety protocols were in place, including screening questions prior to the visit,  additional usage of staff PPE, and extensive cleaning of exam room while observing appropriate contact time as indicated for disinfecting solutions.

## 2019-06-02 ENCOUNTER — Other Ambulatory Visit: Payer: Self-pay

## 2019-06-02 ENCOUNTER — Ambulatory Visit (INDEPENDENT_AMBULATORY_CARE_PROVIDER_SITE_OTHER): Payer: Medicare Other | Admitting: Sports Medicine

## 2019-06-02 VITALS — BP 140/77 | HR 90 | Ht 73.0 in | Wt 216.0 lb

## 2019-06-02 DIAGNOSIS — I10 Essential (primary) hypertension: Secondary | ICD-10-CM | POA: Diagnosis not present

## 2019-06-02 NOTE — Assessment & Plan Note (Signed)
Blood pressure has improved considerably, considering his age I do not think we should drive it down too low, I am okay with 140/77. No change in medications.

## 2019-06-02 NOTE — Progress Notes (Signed)
Appt scheduled for one month.

## 2019-06-02 NOTE — Progress Notes (Signed)
Patient is here for blood pressure check after last reading at his visit with Draedyn Weidinger on 05/19/2019 was 174/89. His Amlodipine was increased to 10 mg daily and he was encouraged to follow a low sodium diet.   Patient has increased medication and changed diet as instructed. Blood pressure reading today is 140/77. He was encouraged to continue his current regimen and we will reach back out to him if any changes are needed.

## 2019-07-06 ENCOUNTER — Ambulatory Visit (INDEPENDENT_AMBULATORY_CARE_PROVIDER_SITE_OTHER): Payer: Medicare Other | Admitting: Physician Assistant

## 2019-07-06 ENCOUNTER — Other Ambulatory Visit: Payer: Self-pay

## 2019-07-06 DIAGNOSIS — I10 Essential (primary) hypertension: Secondary | ICD-10-CM | POA: Diagnosis not present

## 2019-07-06 MED ORDER — AMLODIPINE BESYLATE 10 MG PO TABS: 10.0000 mg | ORAL_TABLET | Freq: Every day | ORAL | 3 refills | Status: DC

## 2019-07-06 NOTE — Progress Notes (Signed)
Subjective:    Patient ID: Albert Albert Hicks, male    DOB: 11/14/1940, 79 Albert Hicks.o.   MRN: [id number]  HPI  Patient is a 79 year old male with hypertension hyperlipidemia who presents to the clinic to follow-up on hypertension and medications.  In February he was noticing his blood pressure was elevated and he had some temporal pain.  His Norvasc was increased to 10 mg.  He had a blood pressure recheck and marked with a blood pressure reading in the 140s over 70s.  He was told to continue same dose.  He is here just to follow-up and get medication refills.  He denies any headache, dizziness, vision changes, shortness of breath, palpitations.  He is taking his Norvasc daily. He checks BP at home and running in 130s over 70's most of the time.     .. Active Ambulatory Problems    Diagnosis Date Noted  . Hyperlipidemia   . Benign essential hypertension   . Obstruction of kidney 02/23/2012  . CKD (chronic kidney disease) stage 3, GFR 30-59 ml/min 03/23/2012  . Hypokalemia 05/09/2012  . Hyponatremia 05/09/2012  . Leukocytosis, unspecified 05/09/2012  . History of gout 06/14/2012  . BPH (benign prostatic hypertrophy) with urinary obstruction 07/03/2012  . Osteoarthritis of both knees 12/01/2012  . Osteoarthritis of both shoulders 07/15/2013  . Colon polyp 04/17/2014  . Nonspecific abnormal electrocardiogram (ECG) (EKG) 05/02/2014  . Abnormal ECG 06/14/2014  . Primary osteoarthritis of both knees 01/18/2015  . Elevated blood sugar 06/08/2015  . Prediabetes 06/22/2015  . Primary osteoarthritis of left knee 07/10/2015  . S/P left TKA 08/21/2015  . Overweight (BMI 25.0-29.9) 08/22/2015  . Acute idiopathic gout of right knee 07/16/2016  . Muscular atrophy 07/16/2016  . OA (osteoarthritis) of hip 07/16/2016  . Normocytic anemia 07/17/2016  . Decreased testosterone level 07/17/2016  . Acute gout of ankle 02/08/2019   Resolved Ambulatory Problems    Diagnosis Date Noted  . History of elevated PSA  01/20/2012  . Osteoarthritis of both knees 01/20/2012  . Enlarged prostate 02/23/2012  . CKD (chronic kidney disease) 03/23/2012  . Pyelonephritis 05/09/2012  . Sepsis due to urinary tract infection ([place]) 05/09/2012  . Diarrhea 05/11/2012  . S/P knee replacement 08/21/2015  . Infected sebaceous cyst 12/02/2016  . Loss of appetite 12/08/2016   Past Medical History:  Diagnosis Date  . Acid reflux OCCASIONALLY TAKE TUMS  . BPH (benign prostatic hypertrophy)   . Complication of anesthesia   . Elevated PSA   . Gross hematuria   . Hydronephrosis, left   . Hypertension   . OA (osteoarthritis) knees  . PONV (postoperative nausea and vomiting)   . Tinnitus   . Wears glasses     Review of Systems See HPI.     Objective:   Physical Exam Vitals reviewed.  Constitutional:      Appearance: Normal appearance.  HENT:     Head: Normocephalic.  Neck:     Vascular: No carotid bruit.  Cardiovascular:     Rate and Rhythm: Normal rate and regular rhythm.     Pulses: Normal pulses.  Pulmonary:     Effort: Pulmonary effort is normal.     Breath sounds: Normal breath sounds.  Neurological:     General: No focal deficit present.     Mental Status: He is alert and oriented to person, place, and time.  Psychiatric:        Mood and Affect: Mood normal.  Assessment & Plan:  Albert KitchenMarland KitchenMontray was seen today for hypertension.  Diagnoses and all orders for this visit:  Essential hypertension -     amLODipine (NORVASC) 10 MG tablet; Take 1 tablet (10 mg total) by mouth daily.   Patient appears to be doing very well today.  He is asymptomatic.  Blood pressure on second recheck was 138/78.  He is checking his blood pressures at home and seems to be running in the 130s over 70s.  We will continue Norvasc today and refills were given.  Follow-up in 6 months.  Continue to work on low-salt DASH diet and get regular exercise.  Call with any worsening blood pressure readings.

## 2019-07-06 NOTE — Patient Instructions (Signed)
DASH Eating Plan DASH stands for "Dietary Approaches to Stop Hypertension." The DASH eating plan is a healthy eating plan that has been shown to reduce high blood pressure (hypertension). It may also reduce your risk for type 2 diabetes, heart disease, and stroke. The DASH eating plan may also help with weight loss. What are tips for following this plan?  General guidelines  Avoid eating more than 2,300 mg (milligrams) of salt (sodium) a day. If you have hypertension, you may need to reduce your sodium intake to 1,500 mg a day.  Limit alcohol intake to no more than 1 drink a day for nonpregnant women and 2 drinks a day for men. One drink equals 12 oz of beer, 5 oz of wine, or 1 oz of hard liquor.  Work with your health care provider to maintain a healthy body weight or to lose weight. Ask what an ideal weight is for you.  Get at least 30 minutes of exercise that causes your heart to beat faster (aerobic exercise) most days of the week. Activities may include walking, swimming, or biking.  Work with your health care provider or diet and nutrition specialist (dietitian) to adjust your eating plan to your individual calorie needs. Reading food labels   Check food labels for the amount of sodium per serving. Choose foods with less than 5 percent of the Daily Value of sodium. Generally, foods with less than 300 mg of sodium per serving fit into this eating plan.  To find whole grains, look for the word "whole" as the first word in the ingredient list. Shopping  Buy products labeled as "low-sodium" or "no salt added."  Buy fresh foods. Avoid canned foods and premade or frozen meals. Cooking  Avoid adding salt when cooking. Use salt-free seasonings or herbs instead of table salt or sea salt. Check with your health care provider or pharmacist before using salt substitutes.  Do not fry foods. Cook foods using healthy methods such as baking, boiling, grilling, and broiling instead.  Cook with  heart-healthy oils, such as olive, canola, soybean, or sunflower oil. Meal planning  Eat a balanced diet that includes: ? 5 or more servings of fruits and vegetables each day. At each meal, try to fill half of your plate with fruits and vegetables. ? Up to 6-8 servings of whole grains each day. ? Less than 6 oz of lean meat, poultry, or fish each day. A 3-oz serving of meat is about the same size as a deck of cards. One egg equals 1 oz. ? 2 servings of low-fat dairy each day. ? A serving of nuts, seeds, or beans 5 times each week. ? Heart-healthy fats. Healthy fats called Omega-3 fatty acids are found in foods such as flaxseeds and coldwater fish, like sardines, salmon, and mackerel.  Limit how much you eat of the following: ? Canned or prepackaged foods. ? Food that is high in trans fat, such as fried foods. ? Food that is high in saturated fat, such as fatty meat. ? Sweets, desserts, sugary drinks, and other foods with added sugar. ? Full-fat dairy products.  Do not salt foods before eating.  Try to eat at least 2 vegetarian meals each week.  Eat more home-cooked food and less restaurant, buffet, and fast food.  When eating at a restaurant, ask that your food be prepared with less salt or no salt, if possible. What foods are recommended? The items listed may not be a complete list. Talk with your dietitian about   what dietary choices are best for you. Grains Whole-grain or whole-wheat bread. Whole-grain or whole-wheat pasta. Brown rice. Oatmeal. Quinoa. Bulgur. Whole-grain and low-sodium cereals. Pita bread. Low-fat, low-sodium crackers. Whole-wheat flour tortillas. Vegetables Fresh or frozen vegetables (raw, steamed, roasted, or grilled). Low-sodium or reduced-sodium tomato and vegetable juice. Low-sodium or reduced-sodium tomato sauce and tomato paste. Low-sodium or reduced-sodium canned vegetables. Fruits All fresh, dried, or frozen fruit. Canned fruit in natural juice (without  added sugar). Meat and other protein foods Skinless chicken or turkey. Ground chicken or turkey. Pork with fat trimmed off. Fish and seafood. Egg whites. Dried beans, peas, or lentils. Unsalted nuts, nut butters, and seeds. Unsalted canned beans. Lean cuts of beef with fat trimmed off. Low-sodium, lean deli meat. Dairy Low-fat (1%) or fat-free (skim) milk. Fat-free, low-fat, or reduced-fat cheeses. Nonfat, low-sodium ricotta or cottage cheese. Low-fat or nonfat yogurt. Low-fat, low-sodium cheese. Fats and oils Soft margarine without trans fats. Vegetable oil. Low-fat, reduced-fat, or light mayonnaise and salad dressings (reduced-sodium). Canola, safflower, olive, soybean, and sunflower oils. Avocado. Seasoning and other foods Herbs. Spices. Seasoning mixes without salt. Unsalted popcorn and pretzels. Fat-free sweets. What foods are not recommended? The items listed may not be a complete list. Talk with your dietitian about what dietary choices are best for you. Grains Baked goods made with fat, such as croissants, muffins, or some breads. Dry pasta or rice meal packs. Vegetables Creamed or fried vegetables. Vegetables in a cheese sauce. Regular canned vegetables (not low-sodium or reduced-sodium). Regular canned tomato sauce and paste (not low-sodium or reduced-sodium). Regular tomato and vegetable juice (not low-sodium or reduced-sodium). Pickles. Olives. Fruits Canned fruit in a light or heavy syrup. Fried fruit. Fruit in cream or butter sauce. Meat and other protein foods Fatty cuts of meat. Ribs. Fried meat. Bacon. Sausage. Bologna and other processed lunch meats. Salami. Fatback. Hotdogs. Bratwurst. Salted nuts and seeds. Canned beans with added salt. Canned or smoked fish. Whole eggs or egg yolks. Chicken or turkey with skin. Dairy Whole or 2% milk, cream, and half-and-half. Whole or full-fat cream cheese. Whole-fat or sweetened yogurt. Full-fat cheese. Nondairy creamers. Whipped toppings.  Processed cheese and cheese spreads. Fats and oils Butter. Stick margarine. Lard. Shortening. Ghee. Bacon fat. Tropical oils, such as coconut, palm kernel, or palm oil. Seasoning and other foods Salted popcorn and pretzels. Onion salt, garlic salt, seasoned salt, table salt, and sea salt. Worcestershire sauce. Tartar sauce. Barbecue sauce. Teriyaki sauce. Soy sauce, including reduced-sodium. Steak sauce. Canned and packaged gravies. Fish sauce. Oyster sauce. Cocktail sauce. Horseradish that you find on the shelf. Ketchup. Mustard. Meat flavorings and tenderizers. Bouillon cubes. Hot sauce and Tabasco sauce. Premade or packaged marinades. Premade or packaged taco seasonings. Relishes. Regular salad dressings. Where to find more information:  National Heart, Lung, and Blood Institute: www.nhlbi.nih.gov  American Heart Association: www.heart.org Summary  The DASH eating plan is a healthy eating plan that has been shown to reduce high blood pressure (hypertension). It may also reduce your risk for type 2 diabetes, heart disease, and stroke.  With the DASH eating plan, you should limit salt (sodium) intake to 2,300 mg a day. If you have hypertension, you may need to reduce your sodium intake to 1,500 mg a day.  When on the DASH eating plan, aim to eat more fresh fruits and vegetables, whole grains, lean proteins, low-fat dairy, and heart-healthy fats.  Work with your health care provider or diet and nutrition specialist (dietitian) to adjust your eating plan to your   individual calorie needs. This information is not intended to replace advice given to you by your health care provider. Make sure you discuss any questions you have with your health care provider. Document Revised: 02/20/2017 Document Reviewed: 03/03/2016 Elsevier Patient Education  2020 Elsevier Inc.  

## 2019-07-08 ENCOUNTER — Encounter: Payer: Self-pay | Admitting: Physician Assistant

## 2019-07-14 ENCOUNTER — Other Ambulatory Visit: Payer: Self-pay

## 2019-07-14 ENCOUNTER — Ambulatory Visit (INDEPENDENT_AMBULATORY_CARE_PROVIDER_SITE_OTHER): Payer: Medicare Other | Admitting: Family Medicine

## 2019-07-14 ENCOUNTER — Encounter: Payer: Self-pay | Admitting: Family Medicine

## 2019-07-14 DIAGNOSIS — I10 Essential (primary) hypertension: Secondary | ICD-10-CM | POA: Diagnosis not present

## 2019-07-14 NOTE — Assessment & Plan Note (Signed)
BP well controlled on manual recheck today.  Continue amlodipine at current dose.  He will bring home BP cuff into next visit to check accuracy.  Continue low sodium diet.  Discussed with him that I did not think sharp pains in temporal area are related to his HTN.   He will let me know if this persists or worsens.

## 2019-07-14 NOTE — Patient Instructions (Signed)
Recheck of your blood pressure looks ok today.  If the pain in your head increases or becomes more frequent let us know.   Continue amlodipine for now.  Next time you come in bring your home cuff and we can compare vs a manual pressureto be sure it is accurate

## 2019-07-14 NOTE — Progress Notes (Signed)
Albert Hicks - 79 y.o. male MRN [id number]  Date of birth: Nov 17, 1940  Subjective Chief Complaint  Patient presents with  . Hypertension    HPI Albert Hicks is a 79 y.o. male with history of HTN here today for follow up of HTN.  He reports that BP at home has been a little elevated around mid 140's/90's.  He has also had intermittent, sharp pain along L temporal area.  He isn't sure if this is related to HTN.  He denies vision changes, tinnitus, dizziness, weakness or headaches or jaw claudication symptoms.   ROS:  A comprehensive ROS was completed and negative except as noted per HPI  Allergies  Allergen Reactions  . Doxycycline     Itchy/rash  . Oxycodone Nausea Only    Pt states all medications with oxy medication in it causes nausea  . Lipitor [Atorvastatin] Other (See Comments)    Muscle ache  . Statins Other (See Comments)    Muscle soreness    Past Medical History:  Diagnosis Date  . Acid reflux OCCASIONALLY TAKE TUMS  . BPH (benign prostatic hypertrophy)   . Complication of anesthesia   . Elevated PSA   . Gross hematuria   . Hydronephrosis, left   . Hyperlipidemia   . Hypertension   . OA (osteoarthritis) knees  . PONV (postoperative nausea and vomiting)   . Tinnitus   . Wears glasses     Past Surgical History:  Procedure Laterality Date  . APPENDECTOMY  AGE 16  . CATARACT EXTRACTION W/ INTRAOCULAR LENS IMPLANT  2013   RIGHT EYE  . CYSTOSCOPY  02/13/2012   Procedure: CYSTOSCOPY FLEXIBLE;  Surgeon: Hanley Ben, MD;  Location: [facility];  Service: Urology;  Laterality: N/A;  . LEFT KNEE SURGERY  1993  (APPROX)  . PROSTATECTOMY N/A 08/03/2012   Procedure: SIMPLE RETROPUBIC PROSTATECTOMY, removal of left double J stent. ;  Surgeon: Hanley Ben, MD;  Location: [facility];  Service: Urology;  Laterality: N/A;  . TOTAL HIP ARTHROPLASTY  07-09-2005  DR ALUISIO   RIGHT HIP OA  . TOTAL HIP ARTHROPLASTY Left 09/17/2016   Procedure: LEFT TOTAL HIP  ARTHROPLASTY ANTERIOR APPROACH;  Surgeon: Gaynelle Arabian, MD;  Location: [facility];  Service: Orthopedics;  Laterality: Left;  . TOTAL KNEE ARTHROPLASTY Left 08/21/2015   Procedure: LEFT TOTAL KNEE ARTHROPLASTY;  Surgeon: Paralee Cancel, MD;  Location: [facility];  Service: Orthopedics;  Laterality: Left;    Social History   Socioeconomic History  . Marital status: Significant Other    Spouse name: Helene Kelp  . Number of children: 5  . Years of education: 37  . Highest education level: Master's degree (e.g., MA, MS, MEng, MEd, MSW, MBA)  Occupational History  . Occupation: retired    Comment: Art therapist  Tobacco Use  . Smoking status: Never Smoker  . Smokeless tobacco: Never Used  Substance and Sexual Activity  . Alcohol use: Yes    Alcohol/week: 2.0 standard drinks    Types: 2 Glasses of wine per week    Comment: 2 glasses wine per night  . Drug use: No  . Sexual activity: Yes  Other Topics Concern  . Not on file  Social History Narrative   Exercises 3 times a week. Very active lifestyle. 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   Social Determinants of Health   Financial Resource Strain:   . Difficulty of Paying Living Expenses:   Food Insecurity:   . Worried About  Running Out of Food in the Last Year:   . [place] in the Last Year:   Transportation Needs:   . Lack of Transportation (Medical):   [name] Kitchen Lack of Transportation (Non-Medical):   Physical Activity:   . Days of Exercise per Week:   . Minutes of Exercise per Session:   Stress:   . Feeling of Stress :   Social Connections:   . Frequency of Communication with Friends and Family:   . Frequency of Social Gatherings with Friends and Family:   . Attends Religious Services:   . Active Member of Clubs or Organizations:   . Attends Archivist Meetings:   [name] Kitchen Marital Status:     Family History  Problem Relation Age of Onset  . Hypertension Mother   . Cancer Father        prostate  .  Hypertension Father   . Diabetes Father     Health Maintenance  Topic Date Due  . COVID-19 Vaccine (1) 02/22/2020 (Originally 04/22/1956)  . INFLUENZA VACCINE  10/23/2019  . COLONOSCOPY  02/09/2021  . TETANUS/TDAP  06/05/2025  . PNA vac Low Risk Adult  Completed     ----------------------------------------------------------------------------------------------------------------------------------------------------------------------------------------------------------------- Physical Exam BP 138/86 Comment: manual  Pulse 85   Temp 97.9 F (36.6 C) (Oral)   Ht 6\' 1"  (1.854 m)   Wt 214 lb (97.1 kg)   BMI 28.23 kg/m   Physical Exam Constitutional:      Appearance: Normal appearance.  HENT:     Head: Normocephalic and atraumatic.     Comments: Temporal area non tender.     Right Ear: Tympanic membrane normal.     Left Ear: Tympanic membrane normal.  Eyes:     General: No scleral icterus.    Pupils: Pupils are equal, round, and reactive to light.  Cardiovascular:     Rate and Rhythm: Normal rate and regular rhythm.  Pulmonary:     Effort: Pulmonary effort is normal.     Breath sounds: Normal breath sounds.  Musculoskeletal:     Cervical back: Neck supple.  Neurological:     General: No focal deficit present.     Mental Status: He is alert.  Psychiatric:        Mood and Affect: Mood normal.        Behavior: Behavior normal.     ------------------------------------------------------------------------------------------------------------------------------------------------------------------------------------------------------------------- Assessment and Plan  Benign essential hypertension BP well controlled on manual recheck today.  Continue amlodipine at current dose.  He will bring home BP cuff into next visit to check accuracy.  Continue low sodium diet.  Discussed with him that I did not think sharp pains in temporal area are related to his HTN.   He will let me  know if this persists or worsens.     No orders of the defined types were placed in this encounter.   No follow-ups on file.    This visit occurred during the SARS-CoV-2 public health emergency.  Safety protocols were in place, including screening questions prior to the visit, additional usage of staff PPE, and extensive cleaning of exam room while observing appropriate contact time as indicated for disinfecting solutions.

## 2019-08-24 DIAGNOSIS — H35373 Puckering of macula, bilateral: Secondary | ICD-10-CM | POA: Diagnosis not present

## 2019-08-24 DIAGNOSIS — H524 Presbyopia: Secondary | ICD-10-CM | POA: Diagnosis not present

## 2019-08-24 DIAGNOSIS — H40013 Open angle with borderline findings, low risk, bilateral: Secondary | ICD-10-CM | POA: Diagnosis not present

## 2019-11-27 DIAGNOSIS — Z23 Encounter for immunization: Secondary | ICD-10-CM | POA: Diagnosis not present

## 2020-03-02 ENCOUNTER — Other Ambulatory Visit: Payer: Self-pay | Admitting: Physician Assistant

## 2020-03-02 DIAGNOSIS — E782 Mixed hyperlipidemia: Secondary | ICD-10-CM

## 2020-03-29 DIAGNOSIS — H35373 Puckering of macula, bilateral: Secondary | ICD-10-CM | POA: Diagnosis not present

## 2020-03-29 DIAGNOSIS — H40013 Open angle with borderline findings, low risk, bilateral: Secondary | ICD-10-CM | POA: Diagnosis not present

## 2020-05-16 DIAGNOSIS — H18509 Unspecified hereditary corneal dystrophies, unspecified eye: Secondary | ICD-10-CM | POA: Diagnosis not present

## 2020-05-22 ENCOUNTER — Other Ambulatory Visit: Payer: Self-pay

## 2020-05-22 ENCOUNTER — Ambulatory Visit (INDEPENDENT_AMBULATORY_CARE_PROVIDER_SITE_OTHER): Payer: Medicare Other | Admitting: Physician Assistant

## 2020-05-22 ENCOUNTER — Ambulatory Visit (INDEPENDENT_AMBULATORY_CARE_PROVIDER_SITE_OTHER): Payer: Medicare Other

## 2020-05-22 VITALS — BP 134/76 | HR 90 | Ht 73.0 in | Wt 214.0 lb

## 2020-05-22 DIAGNOSIS — Z1159 Encounter for screening for other viral diseases: Secondary | ICD-10-CM

## 2020-05-22 DIAGNOSIS — N1831 Chronic kidney disease, stage 3a: Secondary | ICD-10-CM

## 2020-05-22 DIAGNOSIS — K5900 Constipation, unspecified: Secondary | ICD-10-CM

## 2020-05-22 DIAGNOSIS — E782 Mixed hyperlipidemia: Secondary | ICD-10-CM | POA: Diagnosis not present

## 2020-05-22 DIAGNOSIS — R140 Abdominal distension (gaseous): Secondary | ICD-10-CM

## 2020-05-22 DIAGNOSIS — I10 Essential (primary) hypertension: Secondary | ICD-10-CM

## 2020-05-22 NOTE — Patient Instructions (Addendum)
Start metamucil with orange juice in the morning.    Constipation, Adult Constipation is when a person has trouble pooping (having a bowel movement). When you have this condition, you may poop fewer than 3 times a week. Your poop (stool) may also be dry, hard, or bigger than normal. Follow these instructions at home: Eating and drinking  Eat foods that have a lot of fiber, such as: ? Fresh fruits and vegetables. ? Whole grains. ? Beans.  Eat less of foods that are low in fiber and high in fat and sugar, such as: ? [place] fries. ? Hamburgers. ? Cookies. ? Candy. ? Soda.  Drink enough fluid to keep your pee (urine) pale yellow.   General instructions  Exercise regularly or as told by your doctor. Try to do 150 minutes of exercise each week.  Go to the restroom when you feel like you need to poop. Do not hold it in.  Take over-the-counter and prescription medicines only as told by your doctor. These include any fiber supplements.  When you poop: ? Do deep breathing while relaxing your lower belly (abdomen). ? Relax your pelvic floor. The pelvic floor is a group of muscles that support the rectum, bladder, and intestines (as well as the uterus in women).  Watch your condition for any changes. Tell your doctor if you notice any.  Keep all follow-up visits as told by your doctor. This is important. Contact a doctor if:  You have pain that gets worse.  You have a fever.  You have not pooped for 4 days.  You vomit.  You are not hungry.  You lose weight.  You are bleeding from the opening of the butt (anus).  You have thin, pencil-like poop. Get help right away if:  You have a fever, and your symptoms suddenly get worse.  You leak poop or have blood in your poop.  Your belly feels hard or bigger than normal (bloated).  You have very bad belly pain.  You feel dizzy or you faint. Summary  Constipation is when a person poops fewer than 3 times a week, has trouble  pooping, or has poop that is dry, hard, or bigger than normal.  Eat foods that have a lot of fiber.  Drink enough fluid to keep your pee (urine) pale yellow.  Take over-the-counter and prescription medicines only as told by your doctor. These include any fiber supplements. This information is not intended to replace advice given to you by your health care provider. Make sure you discuss any questions you have with your health care provider. Document Revised: 01/26/2019 Document Reviewed: 01/26/2019 Elsevier Patient Education  [place].

## 2020-05-22 NOTE — Progress Notes (Signed)
Subjective:    Patient ID: Albert Hicks, male    DOB: [birth year], 80 y.o.   MRN: [id number]  HPI  Patient is a 80 year old male with hypertension, hyperlipidemia, BPH who presents to the clinic with constipation and bloating for the last month or so.  He normally has bowel movements every day and now he is having them 2-3 times a week.  They do remain soft and denies any straining.  He denies any melena or hematochezia.  At times his abdomen feels a little bloated but denies any abdominal pain.  He has not had any acid reflux symptoms, nausea or vomiting.  He has not had a colonoscopy since 2019 where 3 polyps were removed.  He does admit he has started drinking protein drinks at least 3 times a week since constipation has started.  .. Active Ambulatory Problems    Diagnosis Date Noted  . Hyperlipidemia   . Benign essential hypertension   . Obstruction of kidney 02/23/2012  . CKD (chronic kidney disease) stage 3, GFR 30-59 ml/min (HCC) 03/23/2012  . Hypokalemia 05/09/2012  . Hyponatremia 05/09/2012  . Leukocytosis, unspecified 05/09/2012  . History of gout 06/14/2012  . BPH (benign prostatic hypertrophy) with urinary obstruction 07/03/2012  . Osteoarthritis of both knees 12/01/2012  . Osteoarthritis of both shoulders 07/15/2013  . Colon polyp 04/17/2014  . Nonspecific abnormal electrocardiogram (ECG) (EKG) 05/02/2014  . Abnormal ECG 06/14/2014  . Primary osteoarthritis of both knees 01/18/2015  . Elevated blood sugar 06/08/2015  . Prediabetes 06/22/2015  . Primary osteoarthritis of left knee 07/10/2015  . S/P left TKA 08/21/2015  . Overweight (BMI 25.0-29.9) 08/22/2015  . Acute idiopathic gout of right knee 07/16/2016  . Muscular atrophy 07/16/2016  . OA (osteoarthritis) of hip 07/16/2016  . Normocytic anemia 07/17/2016  . Decreased testosterone level 07/17/2016  . Acute gout of ankle 02/08/2019  . Constipation 05/23/2020  . Bloating 05/23/2020   Resolved Ambulatory Problems     Diagnosis Date Noted  . History of elevated PSA 01/20/2012  . Osteoarthritis of both knees 01/20/2012  . Enlarged prostate 02/23/2012  . CKD (chronic kidney disease) 03/23/2012  . Pyelonephritis 05/09/2012  . Sepsis due to urinary tract infection ([place]) 05/09/2012  . Diarrhea 05/11/2012  . S/P knee replacement 08/21/2015  . Infected sebaceous cyst 12/02/2016  . Loss of appetite 12/08/2016   Past Medical History:  Diagnosis Date  . Acid reflux OCCASIONALLY TAKE TUMS  . BPH (benign prostatic hypertrophy)   . Complication of anesthesia   . Elevated PSA   . Gross hematuria   . Hydronephrosis, left   . Hypertension   . OA (osteoarthritis) knees  . PONV (postoperative nausea and vomiting)   . Tinnitus   . Wears glasses       Review of Systems  All other systems reviewed and are negative.      Objective:   Physical Exam Vitals reviewed.  Constitutional:      Appearance: He is well-developed.  HENT:     Head: Normocephalic.  Cardiovascular:     Rate and Rhythm: Normal rate and regular rhythm.     Heart sounds: Normal heart sounds.  Pulmonary:     Effort: Pulmonary effort is normal.     Breath sounds: Normal breath sounds.  Abdominal:     General: Bowel sounds are normal.     Palpations: Abdomen is soft. There is no mass.     Tenderness: There is no abdominal tenderness. There is no guarding.  Comments: Slight abdominal distention.   Musculoskeletal:        General: Normal range of motion.  Skin:    Findings: No rash.  Neurological:     Mental Status: He is alert.  Psychiatric:        Mood and Affect: Mood normal.       .. Depression screen [facility] 2/9 05/23/2020 03/08/2019 10/04/2018 03/02/2018 12/08/2016  Decreased Interest 0 0 0 0 0  Down, Depressed, Hopeless 0 0 0 0 0  PHQ - 2 Score 0 0 0 0 0  Altered sleeping - - 0 - -  Tired, decreased energy - - 0 - -  Change in appetite - - 0 - -  Feeling bad or failure about yourself  - - 0 - -  Trouble  concentrating - - 0 - -  Moving slowly or fidgety/restless - - 0 - -  Suicidal thoughts - - 0 - -  PHQ-9 Score - - 0 - -  Difficult doing work/chores - - Not difficult at all - -       Assessment & Plan:  [name] KitchenMarland KitchenTodrick was seen today for headache.  Diagnoses and all orders for this visit:  Constipation, unspecified constipation type -     DG Abd 1 View  Mixed hyperlipidemia -     Lipid Panel w/reflex Direct LDL  Stage 3a chronic kidney disease ([place]) -     COMPLETE METABOLIC PANEL WITH GFR -     CBC with Differential/Platelet  Encounter for hepatitis C screening test for low risk patient -     Hepatitis C Antibody  Bloating -     DG Abd 1 View  Benign essential hypertension  Essential hypertension -     amLODipine (NORVASC) 10 MG tablet; Take 1 tablet (10 mg total) by mouth daily.   Exact etiology of new constipation and bloating unclear. It could be his new protein drinks.  I would suggest starting some metamucil with orange juice every morning.  Consider cutting back on protein drinks.  Get abdominal Xray.  Get labs.  BP looks great. Refilled norvasc.   Follow up in 6 months or as needed.  covid vaccine and booster UTD. Flu/pneumonia shot UTD.

## 2020-05-23 ENCOUNTER — Encounter: Payer: Self-pay | Admitting: Physician Assistant

## 2020-05-23 DIAGNOSIS — E782 Mixed hyperlipidemia: Secondary | ICD-10-CM | POA: Diagnosis not present

## 2020-05-23 DIAGNOSIS — K5900 Constipation, unspecified: Secondary | ICD-10-CM | POA: Insufficient documentation

## 2020-05-23 DIAGNOSIS — N1831 Chronic kidney disease, stage 3a: Secondary | ICD-10-CM | POA: Diagnosis not present

## 2020-05-23 DIAGNOSIS — Z1159 Encounter for screening for other viral diseases: Secondary | ICD-10-CM | POA: Diagnosis not present

## 2020-05-23 DIAGNOSIS — R140 Abdominal distension (gaseous): Secondary | ICD-10-CM | POA: Insufficient documentation

## 2020-05-23 MED ORDER — AMLODIPINE BESYLATE 10 MG PO TABS: 10.0000 mg | ORAL_TABLET | Freq: Every day | ORAL | 3 refills | Status: DC

## 2020-05-23 NOTE — Progress Notes (Signed)
Not concerning renal stone or vascular calcification.  There is a large amount of stool in colon. I would suggest start with miralax 1 capful or packet in 4oz of water twice a day until a full bowel movement then transition to as needed and metamucil in orange juice daily.

## 2020-05-24 ENCOUNTER — Other Ambulatory Visit: Payer: Self-pay | Admitting: Physician Assistant

## 2020-05-24 DIAGNOSIS — E782 Mixed hyperlipidemia: Secondary | ICD-10-CM

## 2020-05-24 LAB — COMPLETE METABOLIC PANEL WITH GFR
AG Ratio: 1.3 (calc) (ref 1.0–2.5)
ALT: 16 U/L (ref 9–46)
AST: 16 U/L (ref 10–35)
Albumin: 4.3 g/dL (ref 3.6–5.1)
Alkaline phosphatase (APISO): 52 U/L (ref 35–144)
BUN/Creatinine Ratio: 11 (calc) (ref 6–22)
BUN: 14 mg/dL (ref 7–25)
CO2: 25 mmol/L (ref 20–32)
Calcium: 9.4 mg/dL (ref 8.6–10.3)
Chloride: 106 mmol/L (ref 98–110)
Creat: 1.27 mg/dL — ABNORMAL HIGH (ref 0.70–1.18)
GFR, Est African American: 62 mL/min/{1.73_m2} (ref >=60)
GFR, Est Non African American: 53 mL/min/{1.73_m2} — ABNORMAL LOW (ref >=60)
Globulin: 3.4 g/dL (calc) (ref 1.9–3.7)
Glucose, Bld: 99 mg/dL (ref 65–99)
Potassium: 4.8 mmol/L (ref 3.5–5.3)
Sodium: 141 mmol/L (ref 135–146)
Total Bilirubin: 0.4 mg/dL (ref 0.2–1.2)
Total Protein: 7.7 g/dL (ref 6.1–8.1)

## 2020-05-24 LAB — CBC WITH DIFFERENTIAL/PLATELET
Absolute Monocytes: 483 cells/uL (ref 200–950)
Basophils Absolute: 21 cells/uL (ref 0–200)
Basophils Relative: 0.6 %
Eosinophils Absolute: 49 cells/uL (ref 15–500)
Eosinophils Relative: 1.4 %
HCT: 43 % (ref 38.5–50.0)
Hemoglobin: 14.5 g/dL (ref 13.2–17.1)
Lymphs Abs: 889 cells/uL (ref 850–3900)
MCH: 33.3 pg — ABNORMAL HIGH (ref 27.0–33.0)
MCHC: 33.7 g/dL (ref 32.0–36.0)
MCV: 98.6 fL (ref 80.0–100.0)
MPV: 10.2 fL (ref 7.5–12.5)
Monocytes Relative: 13.8 %
Neutro Abs: 2058 cells/uL (ref 1500–7800)
Neutrophils Relative %: 58.8 %
Platelets: 304 10*3/uL (ref 140–400)
RBC: 4.36 10*6/uL (ref 4.20–5.80)
RDW: 13 % (ref 11.0–15.0)
Total Lymphocyte: 25.4 %
WBC: 3.5 10*3/uL — ABNORMAL LOW (ref 3.8–10.8)

## 2020-05-24 LAB — LIPID PANEL W/REFLEX DIRECT LDL
Cholesterol: 182 mg/dL (ref <200)
HDL: 71 mg/dL (ref >=40)
LDL Cholesterol (Calc): 94 mg/dL (calc)
Non-HDL Cholesterol (Calc): 111 mg/dL (calc) (ref <130)
Total CHOL/HDL Ratio: 2.6 (calc) (ref <5.0)
Triglycerides: 77 mg/dL (ref <150)

## 2020-05-24 LAB — HEPATITIS C ANTIBODY
Hepatitis C Ab: NONREACTIVE
SIGNAL TO CUT-OFF: 0.01 (ref <1.00)

## 2020-05-24 MED ORDER — LIVALO 4 MG PO TABS: 1.0000 | ORAL_TABLET | Freq: Every day | ORAL | 3 refills | Status: DC

## 2020-05-24 NOTE — Progress Notes (Signed)
Albert Hicks,   Cholesterol looks amazing.  Hemoglobin great.  Kidney function improved some from one year ago!

## 2020-09-19 DIAGNOSIS — H524 Presbyopia: Secondary | ICD-10-CM | POA: Diagnosis not present

## 2020-09-19 DIAGNOSIS — H40013 Open angle with borderline findings, low risk, bilateral: Secondary | ICD-10-CM | POA: Diagnosis not present

## 2021-01-09 DIAGNOSIS — M545 Low back pain, unspecified: Secondary | ICD-10-CM | POA: Diagnosis not present

## 2021-01-09 DIAGNOSIS — M25552 Pain in left hip: Secondary | ICD-10-CM | POA: Diagnosis not present

## 2021-02-21 DIAGNOSIS — H18501 Unspecified hereditary corneal dystrophies, right eye: Secondary | ICD-10-CM | POA: Diagnosis not present

## 2021-02-21 DIAGNOSIS — H524 Presbyopia: Secondary | ICD-10-CM | POA: Diagnosis not present

## 2021-04-01 ENCOUNTER — Ambulatory Visit (INDEPENDENT_AMBULATORY_CARE_PROVIDER_SITE_OTHER): Payer: Medicare Other | Admitting: Physician Assistant

## 2021-04-01 DIAGNOSIS — Z0000 Encounter for general adult medical examination without abnormal findings: Secondary | ICD-10-CM | POA: Diagnosis not present

## 2021-04-01 NOTE — Progress Notes (Signed)
MEDICARE ANNUAL WELLNESS VISIT  04/01/2021  Telephone Visit Disclaimer This Medicare AWV was conducted by telephone due to national recommendations for restrictions regarding the COVID-19 Pandemic (e.g. social distancing).  I verified, using two identifiers, that I am speaking with Albert Hicks or their authorized healthcare agent. I discussed the limitations, risks, security, and privacy concerns of performing an evaluation and management service by telephone and the potential availability of an in-person appointment in the future. The patient expressed understanding and agreed to proceed.  Location of Patient: Home Location of Provider (nurse):  In the office.  Subjective:    Albert Hicks is a 81 y.o. male patient of Breeback, Albert Car, PA-C who had a Medicare Annual Wellness Visit today via telephone. Albert Hicks is Retired and lives with their partner. he has 5 children. he reports that he is socially active and does interact with friends/family regularly. he is moderately physically active and enjoys working out at [employer].  Patient Care Team: Lavada Mesi as PCP - General (Family Medicine)  Advanced Directives 04/01/2021 03/08/2019 03/02/2018 09/17/2016 09/12/2016 08/24/2015 08/21/2015  Does Patient Have a Medical Advance Directive? No No No No No No No  Would patient like information on creating a medical advance directive? No - Patient declined No - Patient declined No - Patient declined No - Patient declined No - Patient declined No - patient declined information No - patient declined information  Pre-existing out of facility DNR order (yellow form or pink MOST form) - - - - - - -    Hospital Utilization Over the Past 12 Months: # of hospitalizations or ER visits: 0 # of surgeries: 0  Review of Systems    Patient reports that his overall health is unchanged compared to last year.  History obtained from chart review and the patient  Patient Reported Readings (BP, Pulse, CBG,  Weight, etc) none  Pain Assessment Pain : No/denies pain     Current Medications & Allergies (verified) Allergies as of 04/01/2021       Reactions   Doxycycline    Itchy/rash   Oxycodone Nausea Only   Pt states all medications with oxy medication in it causes nausea   Lipitor [atorvastatin] Other (See Comments)   Muscle ache   Statins Other (See Comments)   Muscle soreness        Medication List        Accurate as of April 01, 2021  2:25 PM. If you have any questions, ask your nurse or doctor.          amLODipine 10 MG tablet Commonly known as: NORVASC Take 1 tablet (10 mg total) by mouth daily.   Livalo 4 MG Tabs Generic drug: Pitavastatin Calcium Take 1 tablet (4 mg total) by mouth daily.   SYSTANE OP Place 1-2 drops into both eyes daily as needed (For dry eyes.).        History (reviewed): Past Medical History:  Diagnosis Date   Acid reflux OCCASIONALLY TAKE TUMS   BPH (benign prostatic hypertrophy)    Complication of anesthesia    Elevated PSA    Gross hematuria    Hydronephrosis, left    Hyperlipidemia    Hypertension    OA (osteoarthritis) knees   PONV (postoperative nausea and vomiting)    Tinnitus    Wears glasses    Past Surgical History:  Procedure Laterality Date   APPENDECTOMY  AGE 39   CATARACT EXTRACTION W/ INTRAOCULAR LENS IMPLANT  2013  RIGHT EYE   CYSTOSCOPY  02/13/2012   Procedure: CYSTOSCOPY FLEXIBLE;  Surgeon: Hanley Ben, MD;  Location: [facility];  Service: Urology;  Laterality: N/A;   LEFT KNEE SURGERY  1993  (APPROX)   PROSTATECTOMY N/A 08/03/2012   Procedure: SIMPLE RETROPUBIC PROSTATECTOMY, removal of left double J stent. ;  Surgeon: Hanley Ben, MD;  Location: [facility];  Service: Urology;  Laterality: N/A;   TOTAL HIP ARTHROPLASTY  07-09-2005  DR Wynelle Link   RIGHT HIP OA   TOTAL HIP ARTHROPLASTY Left 09/17/2016   Procedure: LEFT TOTAL HIP ARTHROPLASTY ANTERIOR APPROACH;  Surgeon: Gaynelle Arabian,  MD;  Location: [facility];  Service: Orthopedics;  Laterality: Left;   TOTAL KNEE ARTHROPLASTY Left 08/21/2015   Procedure: LEFT TOTAL KNEE ARTHROPLASTY;  Surgeon: Paralee Cancel, MD;  Location: [facility];  Service: Orthopedics;  Laterality: Left;   Family History  Problem Relation Age of Onset   Hypertension Mother    Cancer Father        prostate   Hypertension Father    Diabetes Father    Social History   Socioeconomic History   Marital status: Significant Other    Spouse name: Helene Kelp   Number of children: 5   Years of education: 81   Highest education level: Master's degree (e.g., MA, MS, MEng, MEd, MSW, MBA)  Occupational History   Occupation: retired    Comment: Art therapist  Tobacco Use   Smoking status: Never   Smokeless tobacco: Never  Vaping Use   Vaping Use: Never used  Substance and Sexual Activity   Alcohol use: Yes    Alcohol/week: 2.0 standard drinks    Types: 2 Glasses of wine per week    Comment: 2 glasses wine per night   Drug use: No   Sexual activity: Yes  Other Topics Concern   Not on file  Social History Narrative   Lives with significant other. Exercises 3-4 times a week. Very active lifestyle. 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    Social Determinants of Health   Financial Resource Strain: Low Risk    Difficulty of Paying Living Expenses: Not hard at all  Food Insecurity: No Food Insecurity   Worried About Charity fundraiser in the Last Year: Never true   [place] in the Last Year: Never true  Transportation Needs: No Transportation Needs   Lack of Transportation (Medical): No   Lack of Transportation (Non-Medical): No  Physical Activity: Sufficiently Active   Days of Exercise per Week: 4 days   Minutes of Exercise per Session: 90 min  Stress: No Stress Concern Present   Feeling of Stress : Not at all  Social Connections: Moderately Integrated   Frequency of Communication with Friends and Family: Twice a week   Frequency of Social Gatherings  with Friends and Family: More than three times a week   Attends Religious Services: More than 4 times per year   Active Member of [employer] or Organizations: No   Attends Archivist Meetings: Never   Marital Status: Living with partner    Activities of Daily Living In your present state of health, do you have any difficulty performing the following activities: 04/01/2021  Hearing? N  Vision? Y  Comment vision problem in the right eye; currently on treatment.  Difficulty concentrating or making decisions? N  Walking or climbing stairs? N  Dressing or bathing? N  Doing errands, shopping? N  Preparing Food and eating ? N  Using  the Toilet? N  In the past six months, have you accidently leaked urine? N  Do you have problems with loss of bowel control? N  Managing your Medications? N  Managing your Finances? N  Housekeeping or managing your Housekeeping? N  Some recent data might be hidden    Patient Education/ Literacy How often do you need to have someone help you when you read instructions, pamphlets, or other written materials from your doctor or pharmacy?: 1 - Never What is the last grade level you completed in school?: Masters degree  Exercise Current Exercise Habits: Structured exercise class, Type of exercise: strength training/weights;Other - see comments (stationary bike), Time (Minutes): > 60, Frequency (Times/Week): 4, Weekly Exercise (Minutes/Week): 0, Intensity: Moderate, Exercise limited by: None identified  Diet Patient reports consuming 2 meals a day and 0 snack(s) a day Patient reports that his primary diet is: Regular Patient reports that she does have regular access to food.   Depression Screen PHQ 2/9 Scores 04/01/2021 05/23/2020 03/08/2019 10/04/2018 03/02/2018 12/08/2016 12/01/2016  PHQ - 2 Score 0 0 0 0 0 0 0  PHQ- 9 Score - - - 0 - - -     Fall Risk Fall Risk  04/01/2021 03/08/2019 02/16/2019 03/02/2018 02/11/2018  Falls in the past year? 1 0 0 0 0   Comment - - Emmi Telephone Survey: data to providers prior to load - Emmi Telephone Survey: data to providers prior to load  Number falls in past yr: 0 0 - - -  Injury with Fall? 1 0 - - -  Risk for fall due to : History of fall(s) - - - -  Follow up Falls evaluation completed;Falls prevention discussed;Education provided Falls prevention discussed - Falls prevention discussed -     Objective:  Rami Budhu seemed alert and oriented and he participated appropriately during our telephone visit.  Blood Pressure Weight BMI  BP Readings from Last 3 Encounters:  05/22/20 134/76  07/14/19 138/86  07/06/19 138/78   Wt Readings from Last 3 Encounters:  05/22/20 214 lb (97.1 kg)  07/14/19 214 lb (97.1 kg)  07/06/19 215 lb (97.5 kg)   BMI Readings from Last 1 Encounters:  05/22/20 28.23 kg/m    *Unable to obtain current vital signs, weight, and BMI due to telephone visit type  Hearing/Vision  Milus Banister did not seem to have difficulty with hearing/understanding during the telephone conversation Reports that he has had a formal eye exam by an eye care professional within the past year Reports that he has not had a formal hearing evaluation within the past year *Unable to fully assess hearing and vision during telephone visit type  Cognitive Function: 6CIT Screen 04/01/2021 03/08/2019 03/02/2018  What Year? 0 points 0 points 0 points  What month? 0 points 0 points 0 points  What time? 0 points 0 points 0 points  Count back from 20 0 points 0 points 0 points  Months in reverse 0 points 0 points 0 points  Repeat phrase 0 points 0 points 0 points  Total Score 0 0 0   (Normal:0-7, Significant for Dysfunction: >8)  Normal Cognitive Function Screening: Yes   Immunization & Health Maintenance Record Immunization History  Administered Date(s) Administered   Fluad Quad(high Dose 65+) 01/17/2019   Influenza-Unspecified 02/21/2021   PFIZER(Purple Top)SARS-COV-2 Vaccination 04/25/2019,  05/16/2019, 11/11/2019   Pneumococcal Conjugate-13 06/06/2015   Pneumococcal Polysaccharide-23 12/16/2017   Tdap 06/06/2015    Health Maintenance  Topic Date Due   COVID-19 Vaccine (4 -  Booster for [employer] series) 04/17/2021 (Originally 01/06/2020)   Zoster Vaccines- Shingrix (1 of 2) 06/30/2021 (Originally 04/23/1991)   COLONOSCOPY (Pts 45-63yrs Insurance coverage will need to be confirmed)  04/01/2022 (Originally 02/09/2021)   TETANUS/TDAP  06/05/2025   Pneumonia Vaccine 58+ Years old  Completed   INFLUENZA VACCINE  Completed   Hepatitis C Screening  Completed   HPV VACCINES  Aged Out       Assessment  This is a routine wellness examination for [employer].  Health Maintenance: Due or Overdue There are no preventive care reminders to display for this patient.   Vash Quezada does not need a referral for Community Assistance: Care Management:   no Social Work:    no Prescription Assistance:  no Nutrition/Diabetes Education:  no   Plan:  Personalized Goals  Goals Addressed               This Visit's Progress     Patient Stated (pt-stated)        04/01/2021 AWV Goal: Improved Nutrition/Diet  Patient will verbalize understanding that diet plays an important role in overall health and that a poor diet is a risk factor for many chronic medical conditions.  Over the next year, patient will improve self management of their diet by incorporating more water . Patient will utilize available community resources to help with food acquisition if needed (ex: food pantries, [address], etc) Patient will work with nutrition specialist if a referral was made        Personalized Health Maintenance & Screening Recommendations  Shingles vaccine  Lung Cancer Screening Recommended: no (Low Dose CT Chest recommended if Age 61-80 years, 30 pack-year currently smoking OR have quit w/in past 15 years) Hepatitis C Screening recommended: no HIV Screening recommended: no  Advanced  Directives: Written information was not prepared per patient's request.  Referrals & Orders No orders of the defined types were placed in this encounter.   Follow-up Plan Follow-up with Donella Stade, PA-C as planned Please schedule an appointment at your pharmacy.  Medicare wellness visit in one year. AVS printed and mailed to the patient.   I have personally reviewed and noted the following in the patients chart:   Medical and social history Use of alcohol, tobacco or illicit drugs  Current medications and supplements Functional ability and status Nutritional status Physical activity Advanced directives List of other physicians Hospitalizations, surgeries, and ER visits in previous 12 months Vitals Screenings to include cognitive, depression, and falls Referrals and appointments  In addition, I have reviewed and discussed with Albert Hicks certain preventive protocols, quality metrics, and best practice recommendations. A written personalized care plan for preventive services as well as general preventive health recommendations is available and can be mailed to the patient at his request.      Tinnie Gens, RN  04/01/2021

## 2021-04-01 NOTE — Patient Instructions (Addendum)
[facility] Maintenance Summary and Written Plan of Care  [name] ,  Thank you for allowing me to perform your Medicare Annual Wellness Visit and for your ongoing commitment to your health.   Health Maintenance & Immunization History Health Maintenance  Topic Date Due   COVID-19 Vaccine (4 - Booster for Pfizer series) 04/17/2021 (Originally 01/06/2020)   Zoster Vaccines- Shingrix (1 of 2) 06/30/2021 (Originally 04/23/1991)   COLONOSCOPY (Pts 45-3yrs Insurance coverage will need to be confirmed)  04/01/2022 (Originally 02/09/2021)   TETANUS/TDAP  06/05/2025   Pneumonia Vaccine 20+ Years old  Completed   INFLUENZA VACCINE  Completed   Hepatitis C Screening  Completed   HPV VACCINES  Aged Out   Immunization History  Administered Date(s) Administered   Fluad Quad(high Dose 65+) 01/17/2019   Influenza-Unspecified 02/21/2021   PFIZER(Purple Top)SARS-COV-2 Vaccination 04/25/2019, 05/16/2019, 11/11/2019   Pneumococcal Conjugate-13 06/06/2015   Pneumococcal Polysaccharide-23 12/16/2017   Tdap 06/06/2015    These are the patient goals that we discussed:  Goals Addressed               This Visit's Progress     Patient Stated (pt-stated)        04/01/2021 AWV Goal: Improved Nutrition/Diet  Patient will verbalize understanding that diet plays an important role in overall health and that a poor diet is a risk factor for many chronic medical conditions.  Over the next year, patient will improve self management of their diet by incorporating more water . Patient will utilize available community resources to help with food acquisition if needed (ex: food pantries, [address], etc) Patient will work with nutrition specialist if a referral was made          This is a list of Health Maintenance Items that are overdue or due now: Shingles vaccine   Orders/Referrals Placed Today: No orders of the defined types were placed in this encounter.  (Contact our  referral department at [phone] if you have not spoken with someone about your referral appointment within the next 5 days)    Follow-up Plan Follow-up with Donella Stade, PA-C as planned Please schedule an appointment at your pharmacy.  Medicare wellness visit in one year. AVS printed and mailed to the patient.      Health Maintenance, Male Adopting a healthy lifestyle and getting preventive care are important in promoting health and wellness. Ask your health care provider about: The right schedule for you to have regular tests and exams. Things you can do on your own to prevent diseases and keep yourself healthy. What should I know about diet, weight, and exercise? Eat a healthy diet  Eat a diet that includes plenty of vegetables, fruits, low-fat dairy products, and lean protein. Do not eat a lot of foods that are high in solid fats, added sugars, or sodium. Maintain a healthy weight Body mass index (BMI) is a measurement that can be used to identify possible weight problems. It estimates body fat based on height and weight. Your health care provider can help determine your BMI and help you achieve or maintain a healthy weight. Get regular exercise Get regular exercise. This is one of the most important things you can do for your health. Most adults should: Exercise for at least 150 minutes each week. The exercise should increase your heart rate and make you sweat (moderate-intensity exercise). Do strengthening exercises at least twice a week. This is in addition to the moderate-intensity exercise. Spend less time  sitting. Even light physical activity can be beneficial. Watch cholesterol and blood lipids Have your blood tested for lipids and cholesterol at 80 years of age, then have this test every 5 years. You may need to have your cholesterol levels checked more often if: Your lipid or cholesterol levels are high. You are older than 80 years of age. You are at high risk  for heart disease. What should I know about cancer screening? Many types of cancers can be detected early and may often be prevented. Depending on your health history and family history, you may need to have cancer screening at various ages. This may include screening for: Colorectal cancer. Prostate cancer. Skin cancer. Lung cancer. What should I know about heart disease, diabetes, and high blood pressure? Blood pressure and heart disease High blood pressure causes heart disease and increases the risk of stroke. This is more likely to develop in people who have high blood pressure readings or are overweight. Talk with your health care provider about your target blood pressure readings. Have your blood pressure checked: Every 3-5 years if you are 14-62 years of age. Every year if you are 38 years old or older. If you are between the ages of 42 and 55 and are a current or former smoker, ask your health care provider if you should have a one-time screening for abdominal aortic aneurysm (AAA). Diabetes Have regular diabetes screenings. This checks your fasting blood sugar level. Have the screening done: Once every three years after age 70 if you are at a normal weight and have a low risk for diabetes. More often and at a younger age if you are overweight or have a high risk for diabetes. What should I know about preventing infection? Hepatitis B If you have a higher risk for hepatitis B, you should be screened for this virus. Talk with your health care provider to find out if you are at risk for hepatitis B infection. Hepatitis C Blood testing is recommended for: Everyone born from [birth year] through 1964. Anyone with known risk factors for hepatitis C. Sexually transmitted infections (STIs) You should be screened each year for STIs, including gonorrhea and chlamydia, if: You are sexually active and are younger than 80 years of age. You are older than 80 years of age and your health care provider  tells you that you are at risk for this type of infection. Your sexual activity has changed since you were last screened, and you are at increased risk for chlamydia or gonorrhea. Ask your health care provider if you are at risk. Ask your health care provider about whether you are at high risk for HIV. Your health care provider may recommend a prescription medicine to help prevent HIV infection. If you choose to take medicine to prevent HIV, you should first get tested for HIV. You should then be tested every 3 months for as long as you are taking the medicine. Follow these instructions at home: Alcohol use Do not drink alcohol if your health care provider tells you not to drink. If you drink alcohol: Limit how much you have to 0-2 drinks a day. Know how much alcohol is in your drink. In the [place] one drink equals one 12 oz bottle of beer (355 mL), one 5 oz glass of wine (148 mL), or one 1 oz glass of hard liquor (44 mL). Lifestyle Do not use any products that contain nicotine or tobacco. These products include cigarettes, chewing tobacco, and vaping devices, such as e-cigarettes.  If you need help quitting, ask your health care provider. Do not use street drugs. Do not share needles. Ask your health care provider for help if you need support or information about quitting drugs. General instructions Schedule regular health, dental, and eye exams. Stay current with your vaccines. Tell your health care provider if: You often feel depressed. You have ever been abused or do not feel safe at home. Summary Adopting a healthy lifestyle and getting preventive care are important in promoting health and wellness. Follow your health care provider's instructions about healthy diet, exercising, and getting tested or screened for diseases. Follow your health care provider's instructions on monitoring your cholesterol and blood pressure. This information is not intended to replace advice given to you by  your health care provider. Make sure you discuss any questions you have with your health care provider. Document Revised: 07/30/2020 Document Reviewed: 07/30/2020 Elsevier Patient Education  [place].

## 2021-05-15 ENCOUNTER — Other Ambulatory Visit: Payer: Self-pay | Admitting: Physician Assistant

## 2021-05-15 DIAGNOSIS — E782 Mixed hyperlipidemia: Secondary | ICD-10-CM

## 2021-06-19 ENCOUNTER — Other Ambulatory Visit: Payer: Self-pay | Admitting: Physician Assistant

## 2021-06-19 DIAGNOSIS — I10 Essential (primary) hypertension: Secondary | ICD-10-CM

## 2021-06-24 ENCOUNTER — Encounter: Payer: Self-pay | Admitting: Physician Assistant

## 2021-06-24 ENCOUNTER — Telehealth: Payer: Self-pay

## 2021-06-24 ENCOUNTER — Ambulatory Visit (INDEPENDENT_AMBULATORY_CARE_PROVIDER_SITE_OTHER): Payer: Medicare Other | Admitting: Physician Assistant

## 2021-06-24 VITALS — BP 141/73 | HR 84 | Resp 16 | Ht 73.0 in | Wt 210.0 lb

## 2021-06-24 DIAGNOSIS — Z789 Other specified health status: Secondary | ICD-10-CM

## 2021-06-24 DIAGNOSIS — Z79899 Other long term (current) drug therapy: Secondary | ICD-10-CM | POA: Diagnosis not present

## 2021-06-24 DIAGNOSIS — R351 Nocturia: Secondary | ICD-10-CM

## 2021-06-24 DIAGNOSIS — R7303 Prediabetes: Secondary | ICD-10-CM

## 2021-06-24 DIAGNOSIS — I10 Essential (primary) hypertension: Secondary | ICD-10-CM

## 2021-06-24 DIAGNOSIS — Z125 Encounter for screening for malignant neoplasm of prostate: Secondary | ICD-10-CM | POA: Diagnosis not present

## 2021-06-24 DIAGNOSIS — R7989 Other specified abnormal findings of blood chemistry: Secondary | ICD-10-CM

## 2021-06-24 DIAGNOSIS — Z1329 Encounter for screening for other suspected endocrine disorder: Secondary | ICD-10-CM | POA: Diagnosis not present

## 2021-06-24 DIAGNOSIS — E782 Mixed hyperlipidemia: Secondary | ICD-10-CM | POA: Diagnosis not present

## 2021-06-24 MED ORDER — LIVALO 4 MG PO TABS: 4.0000 mg | ORAL_TABLET | Freq: Every day | ORAL | 3 refills | Status: DC

## 2021-06-24 MED ORDER — AMLODIPINE BESYLATE 10 MG PO TABS: 10.0000 mg | ORAL_TABLET | Freq: Every day | ORAL | 3 refills | Status: DC

## 2021-06-24 NOTE — Telephone Encounter (Addendum)
Initiated Prior authorization for: Pitavastatin Calcium (LIVALO) 4 MG TABS ?Via: [facility] [phone] ?Case/Key:n/a ?Status: approved as of 06/24/21 ?Reason:Livalo is a covered medication awaiting step therapy form to be faxed pt needs to fail a statain for approval 06/26/21-03/23/2221 ?Notified Pt via: Mychart, Called pt to notify them of approval left voicemail  ?

## 2021-06-24 NOTE — Progress Notes (Signed)
? ?Subjective:  ? ? Patient ID: Albert Hicks, male    DOB: [birth year], 81 y.o.   MRN: [id number] ? ?HPI ?Pt is a 81 yo male with HTN, HLD, OA of hip/bilateral knees and shoulders, BPH, CKD who presents to the clinic for medication refills.  ? ?He is doing well with OA pain on tumeric. No concerns.  ? ?His insurance sent him a letter not wanting to pain for livalo. He has had lots of problems with statins in the past with muscle pain and soreness. He needs [place] to get this approved for him to take livalo which he has tolerated for over 2 years.  ? ?Failed:  ? ? ?Pravastatin  ?Atorvostatin  ?Simvastain  ? ?.. ?Active Ambulatory Problems  ?  Diagnosis Date Noted  ? Hyperlipidemia   ? Essential hypertension   ? Obstruction of kidney 02/23/2012  ? CKD (chronic kidney disease) stage 3, GFR 30-59 ml/min (HCC) 03/23/2012  ? Hypokalemia 05/09/2012  ? Hyponatremia 05/09/2012  ? Leukocytosis, unspecified 05/09/2012  ? History of gout 06/14/2012  ? BPH (benign prostatic hypertrophy) with urinary obstruction 07/03/2012  ? Osteoarthritis of both knees 12/01/2012  ? Osteoarthritis of both shoulders 07/15/2013  ? Colon polyp 04/17/2014  ? Nonspecific abnormal electrocardiogram (ECG) (EKG) 05/02/2014  ? Abnormal ECG 06/14/2014  ? Primary osteoarthritis of both knees 01/18/2015  ? Elevated blood sugar 06/08/2015  ? Prediabetes 06/22/2015  ? Primary osteoarthritis of left knee 07/10/2015  ? S/P left TKA 08/21/2015  ? Overweight (BMI 25.0-29.9) 08/22/2015  ? Acute idiopathic gout of right knee 07/16/2016  ? Muscular atrophy 07/16/2016  ? OA (osteoarthritis) of hip 07/16/2016  ? Normocytic anemia 07/17/2016  ? Decreased testosterone level 07/17/2016  ? Acute gout of ankle 02/08/2019  ? Constipation 05/23/2020  ? Bloating 05/23/2020  ? Statin intolerance 06/24/2021  ? Nocturia 06/24/2021  ? ?Resolved Ambulatory Problems  ?  Diagnosis Date Noted  ? History of elevated PSA 01/20/2012  ? Osteoarthritis of both knees 01/20/2012  ? Enlarged  prostate 02/23/2012  ? CKD (chronic kidney disease) 03/23/2012  ? Pyelonephritis 05/09/2012  ? Sepsis due to urinary tract infection ([place]) 05/09/2012  ? Diarrhea 05/11/2012  ? S/P knee replacement 08/21/2015  ? Infected sebaceous cyst 12/02/2016  ? Loss of appetite 12/08/2016  ? ?Past Medical History:  ?Diagnosis Date  ? Acid reflux OCCASIONALLY TAKE TUMS  ? BPH (benign prostatic hypertrophy)   ? Complication of anesthesia   ? Elevated PSA   ? Gross hematuria   ? Hydronephrosis, left   ? Hypertension   ? OA (osteoarthritis) knees  ? PONV (postoperative nausea and vomiting)   ? Tinnitus   ? Wears glasses   ? ? ? ?Review of Systems  ?All other systems reviewed and are negative. ? ?   ?Objective:  ? Physical Exam ?Vitals reviewed.  ?Constitutional:   ?   Appearance: Normal appearance.  ?HENT:  ?   Head: Normocephalic.  ?Neck:  ?   Vascular: No carotid bruit.  ?Cardiovascular:  ?   Rate and Rhythm: Normal rate and regular rhythm.  ?   Pulses: Normal pulses.  ?   Heart sounds: Normal heart sounds.  ?Pulmonary:  ?   Effort: Pulmonary effort is normal.  ?   Breath sounds: Normal breath sounds.  ?Musculoskeletal:  ?   Right lower leg: No edema.  ?   Left lower leg: No edema.  ?Lymphadenopathy:  ?   Cervical: No cervical adenopathy.  ?Neurological:  ?  General: No focal deficit present.  ?   Mental Status: He is alert and oriented to person, place, and time.  ?   Comments: 2+ pedal pulses, bilaterally.   ?Psychiatric:     ?   Mood and Affect: Mood normal.  ? ? ? ? ?.. ? ?  06/24/2021  ?  1:41 PM 04/01/2021  ?  2:08 PM 05/23/2020  ?  5:59 AM 03/08/2019  ?  2:04 PM 10/04/2018  ?  2:24 PM  ?Depression screen PHQ 2/9  ?Decreased Interest 0 0 0 0 0  ?Down, Depressed, Hopeless 0 0 0 0 0  ?PHQ - 2 Score 0 0 0 0 0  ?Altered sleeping     0  ?Tired, decreased energy     0  ?Change in appetite     0  ?Feeling bad or failure about yourself      0  ?Trouble concentrating     0  ?Moving slowly or fidgety/restless     0  ?Suicidal thoughts     0   ?PHQ-9 Score     0  ?Difficult doing work/chores     Not difficult at all  ? ? ?   ?Assessment & Plan:  ?..Albert Hicks was seen today for discuss medication and hypertension. ? ?Diagnoses and all orders for this visit: ? ?Essential hypertension ?-     COMPLETE METABOLIC PANEL WITH GFR ?-     amLODipine (NORVASC) 10 MG tablet; Take 1 tablet (10 mg total) by mouth daily. ? ?Mixed hyperlipidemia ?-     Lipid Panel w/reflex Direct LDL ? ?Decreased testosterone level ?-     Testosterone ? ?Prediabetes ?-     Hemoglobin A1c ? ?Screening PSA (prostate specific antigen) ?-     PSA ? ?Thyroid disorder screen ?-     TSH ? ?Medication management ?-     PSA ?-     TSH ?-     Lipid Panel w/reflex Direct LDL ?-     COMPLETE METABOLIC PANEL WITH GFR ?-     CBC with Differential/Platelet ? ?Statin intolerance ? ?Nocturia ?-     PSA ? ? ?Pt failed atorvastatin, simvastatin, pravastatin with myalgias. He tolerated pitavastatin for the last 2 plus years and needs [place] to get approved. Last LDL was 94. Recheck today.  ? ?Fasting labs ordered today.  ? ?BP to goal for age. Refilled norvasc.  ? ?Pt declines shingles and covid vaccine.  ?Flu/pneumonia UTD.  ? ? ?

## 2021-06-27 DIAGNOSIS — Z1329 Encounter for screening for other suspected endocrine disorder: Secondary | ICD-10-CM | POA: Diagnosis not present

## 2021-06-27 DIAGNOSIS — I10 Essential (primary) hypertension: Secondary | ICD-10-CM | POA: Diagnosis not present

## 2021-06-27 DIAGNOSIS — Z79899 Other long term (current) drug therapy: Secondary | ICD-10-CM | POA: Diagnosis not present

## 2021-06-27 DIAGNOSIS — R7989 Other specified abnormal findings of blood chemistry: Secondary | ICD-10-CM | POA: Diagnosis not present

## 2021-06-27 DIAGNOSIS — E782 Mixed hyperlipidemia: Secondary | ICD-10-CM | POA: Diagnosis not present

## 2021-06-27 DIAGNOSIS — R7303 Prediabetes: Secondary | ICD-10-CM | POA: Diagnosis not present

## 2021-06-27 DIAGNOSIS — Z125 Encounter for screening for malignant neoplasm of prostate: Secondary | ICD-10-CM | POA: Diagnosis not present

## 2021-06-27 DIAGNOSIS — R351 Nocturia: Secondary | ICD-10-CM | POA: Diagnosis not present

## 2021-06-28 LAB — COMPLETE METABOLIC PANEL WITH GFR
AG Ratio: 1.3 (calc) (ref 1.0–2.5)
ALT: 13 U/L (ref 9–46)
AST: 15 U/L (ref 10–35)
Albumin: 4.2 g/dL (ref 3.6–5.1)
Alkaline phosphatase (APISO): 51 U/L (ref 35–144)
BUN/Creatinine Ratio: 12 (calc) (ref 6–22)
BUN: 16 mg/dL (ref 7–25)
CO2: 24 mmol/L (ref 20–32)
Calcium: 9.2 mg/dL (ref 8.6–10.3)
Chloride: 108 mmol/L (ref 98–110)
Creat: 1.37 mg/dL — ABNORMAL HIGH (ref 0.70–1.22)
Globulin: 3.2 g/dL (calc) (ref 1.9–3.7)
Glucose, Bld: 108 mg/dL — ABNORMAL HIGH (ref 65–99)
Potassium: 4.6 mmol/L (ref 3.5–5.3)
Sodium: 141 mmol/L (ref 135–146)
Total Bilirubin: 0.5 mg/dL (ref 0.2–1.2)
Total Protein: 7.4 g/dL (ref 6.1–8.1)
eGFR: 52 mL/min/{1.73_m2} — ABNORMAL LOW (ref >=60)

## 2021-06-28 LAB — CBC WITH DIFFERENTIAL/PLATELET
Absolute Monocytes: 547 cells/uL (ref 200–950)
Basophils Absolute: 22 cells/uL (ref 0–200)
Basophils Relative: 0.6 %
Eosinophils Absolute: 68 cells/uL (ref 15–500)
Eosinophils Relative: 1.9 %
HCT: 40.5 % (ref 38.5–50.0)
Hemoglobin: 13.7 g/dL (ref 13.2–17.1)
Lymphs Abs: 1112 cells/uL (ref 850–3900)
MCH: 33.3 pg — ABNORMAL HIGH (ref 27.0–33.0)
MCHC: 33.8 g/dL (ref 32.0–36.0)
MCV: 98.5 fL (ref 80.0–100.0)
MPV: 10 fL (ref 7.5–12.5)
Monocytes Relative: 15.2 %
Neutro Abs: 1850 cells/uL (ref 1500–7800)
Neutrophils Relative %: 51.4 %
Platelets: 318 10*3/uL (ref 140–400)
RBC: 4.11 10*6/uL — ABNORMAL LOW (ref 4.20–5.80)
RDW: 13 % (ref 11.0–15.0)
Total Lymphocyte: 30.9 %
WBC: 3.6 10*3/uL — ABNORMAL LOW (ref 3.8–10.8)

## 2021-06-28 LAB — LIPID PANEL W/REFLEX DIRECT LDL
Cholesterol: 189 mg/dL (ref <200)
HDL: 70 mg/dL (ref >=40)
LDL Cholesterol (Calc): 102 mg/dL (calc) — ABNORMAL HIGH
Non-HDL Cholesterol (Calc): 119 mg/dL (calc) (ref <130)
Total CHOL/HDL Ratio: 2.7 (calc) (ref <5.0)
Triglycerides: 79 mg/dL (ref <150)

## 2021-06-28 LAB — PSA: PSA: 15.73 ng/mL — ABNORMAL HIGH (ref <=4.00)

## 2021-06-28 LAB — TSH: TSH: 3.12 mIU/L (ref 0.40–4.50)

## 2021-06-28 LAB — TESTOSTERONE: Testosterone: 313 ng/dL (ref 250–827)

## 2021-06-28 LAB — HEMOGLOBIN A1C
Hgb A1c MFr Bld: 5.4 % of total Hgb (ref <5.7)
Mean Plasma Glucose: 108 mg/dL
eAG (mmol/L): 6 mmol/L

## 2021-06-30 NOTE — Progress Notes (Signed)
Your prostate enzyme is pretty high. How are you prostate symptoms? Are you seeing urology?  ? ?Cholesterol looks good.  ?HDL looks amazing.  ?Kidney function has droppped some.  ?No diabetes.  ?Thyroid normal.

## 2021-07-03 NOTE — Progress Notes (Signed)
It looks like his PSA was as high as 28 back in 2011.  I think at this point if he is not can have any further work-up for his PSA and he is 81 years old he could certainly opt out of yearly PSA testing.

## 2021-07-24 ENCOUNTER — Other Ambulatory Visit: Payer: Self-pay | Admitting: Physician Assistant

## 2021-07-24 DIAGNOSIS — I10 Essential (primary) hypertension: Secondary | ICD-10-CM

## 2021-12-05 DIAGNOSIS — Z961 Presence of intraocular lens: Secondary | ICD-10-CM | POA: Diagnosis not present

## 2021-12-05 DIAGNOSIS — H524 Presbyopia: Secondary | ICD-10-CM | POA: Diagnosis not present

## 2021-12-05 DIAGNOSIS — H40013 Open angle with borderline findings, low risk, bilateral: Secondary | ICD-10-CM | POA: Diagnosis not present

## 2021-12-05 DIAGNOSIS — H35372 Puckering of macula, left eye: Secondary | ICD-10-CM | POA: Diagnosis not present

## 2021-12-13 ENCOUNTER — Ambulatory Visit (INDEPENDENT_AMBULATORY_CARE_PROVIDER_SITE_OTHER): Payer: Medicare Other | Admitting: Physician Assistant

## 2021-12-13 VITALS — BP 142/68 | HR 90 | Ht 73.0 in | Wt 209.0 lb

## 2021-12-13 DIAGNOSIS — R29810 Facial weakness: Secondary | ICD-10-CM | POA: Diagnosis not present

## 2021-12-13 DIAGNOSIS — E782 Mixed hyperlipidemia: Secondary | ICD-10-CM | POA: Diagnosis not present

## 2021-12-13 DIAGNOSIS — Z23 Encounter for immunization: Secondary | ICD-10-CM

## 2021-12-13 DIAGNOSIS — R7303 Prediabetes: Secondary | ICD-10-CM | POA: Diagnosis not present

## 2021-12-13 DIAGNOSIS — I10 Essential (primary) hypertension: Secondary | ICD-10-CM | POA: Diagnosis not present

## 2021-12-13 NOTE — Patient Instructions (Signed)
Will schedule carotid dopplers and MRI ASA '81mg'$  daily

## 2021-12-13 NOTE — Progress Notes (Unsigned)
Established Patient Office Visit  Subjective   Patient ID: Albert Hicks, male    DOB: 12-16-1940  Age: 81 y.o. MRN: [id number]  Chief Complaint  Patient presents with   Eye Problem    HPI Pt is a 81 yo male with HTN, CKD 3, pre diabetes, HLD who presents to the clinic for follow up after he went to the eye doctor last week. She noticed "some things" and was concerned about "stroke". He denies any speech changes, upper or lower extremity weakness changes, memory concerns, headaches or vision changes.   Pt is very compliant with norvasc and Livalo.   .. Active Ambulatory Problems    Diagnosis Date Noted   Hyperlipidemia    Essential hypertension    Obstruction of kidney 02/23/2012   CKD (chronic kidney disease) stage 3, GFR 30-59 ml/min (HCC) 03/23/2012   Hypokalemia 05/09/2012   Hyponatremia 05/09/2012   Leukocytosis, unspecified 05/09/2012   History of gout 06/14/2012   BPH (benign prostatic hypertrophy) with urinary obstruction 07/03/2012   Osteoarthritis of both knees 12/01/2012   Osteoarthritis of both shoulders 07/15/2013   Colon polyp 04/17/2014   Nonspecific abnormal electrocardiogram (ECG) (EKG) 05/02/2014   Abnormal ECG 06/14/2014   Primary osteoarthritis of both knees 01/18/2015   Elevated blood sugar 06/08/2015   Prediabetes 06/22/2015   Primary osteoarthritis of left knee 07/10/2015   S/P left TKA 08/21/2015   Overweight (BMI 25.0-29.9) 08/22/2015   Acute idiopathic gout of right knee 07/16/2016   Muscular atrophy 07/16/2016   OA (osteoarthritis) of hip 07/16/2016   Normocytic anemia 07/17/2016   Decreased testosterone level 07/17/2016   Acute gout of ankle 02/08/2019   Constipation 05/23/2020   Bloating 05/23/2020   Statin intolerance 06/24/2021   Nocturia 06/24/2021   Facial weakness 12/13/2021   Resolved Ambulatory Problems    Diagnosis Date Noted   History of elevated PSA 01/20/2012   Osteoarthritis of both knees 01/20/2012   Enlarged prostate  02/23/2012   CKD (chronic kidney disease) 03/23/2012   Pyelonephritis 05/09/2012   Sepsis due to urinary tract infection ([place]) 05/09/2012   Diarrhea 05/11/2012   S/P knee replacement 08/21/2015   Infected sebaceous cyst 12/02/2016   Loss of appetite 12/08/2016   Past Medical History:  Diagnosis Date   Acid reflux OCCASIONALLY TAKE TUMS   BPH (benign prostatic hypertrophy)    Complication of anesthesia    Elevated PSA    Gross hematuria    Hydronephrosis, left    Hypertension    OA (osteoarthritis) knees   PONV (postoperative nausea and vomiting)    Tinnitus    Wears glasses      ROS See HPI.    Objective:     BP (!) 142/68   Pulse 90   Ht '6\' 1"'$  (1.854 m)   Wt 209 lb (94.8 kg)   SpO2 97%   BMI 27.57 kg/m  BP Readings from Last 3 Encounters:  12/13/21 (!) 142/68  06/24/21 (!) 141/73  05/22/20 134/76   Wt Readings from Last 3 Encounters:  12/13/21 209 lb (94.8 kg)  06/24/21 210 lb (95.3 kg)  05/22/20 214 lb (97.1 kg)      Physical Exam Constitutional:      Appearance: Normal appearance.  HENT:     Head: Normocephalic.     Nose: Nose normal.     Mouth/Throat:     Mouth: Mucous membranes are moist.     Pharynx: No posterior oropharyngeal erythema.  Eyes:     General:  Right eye: No discharge.        Left eye: No discharge.     Extraocular Movements: Extraocular movements intact.     Conjunctiva/sclera: Conjunctivae normal.     Pupils: Pupils are equal, round, and reactive to light.  Neck:     Vascular: No carotid bruit.  Cardiovascular:     Rate and Rhythm: Normal rate and regular rhythm.  Pulmonary:     Effort: Pulmonary effort is normal.     Breath sounds: Normal breath sounds.  Musculoskeletal:     Right lower leg: No edema.     Left lower leg: No edema.     Comments: Upper and lower extremity strength 5/5.   Neurological:     General: No focal deficit present.     Mental Status: He is alert and oriented to person, place, and time.      Cranial Nerves: No cranial nerve deficit.     Sensory: No sensory deficit.     Motor: No weakness.     Coordination: Coordination normal.     Gait: Gait normal.     Deep Tendon Reflexes: Reflexes normal.     Comments: Right facial weakness but not complete loss of function or drooping.  Speech normal.   Psychiatric:        Mood and Affect: Mood normal.       Assessment & Plan:  [name] KitchenMarland KitchenNaseer was seen today for eye problem.  Diagnoses and all orders for this visit:  Facial weakness -     US Carotid Bilateral; Future -     MR Brain Wo Contrast; Future  Flu vaccine need -     Flu Vaccine QUAD High Dose(Fluad)  Prediabetes -     US Carotid Bilateral; Future -     MR Brain Wo Contrast; Future  Essential hypertension -     US Carotid Bilateral; Future -     MR Brain Wo Contrast; Future  Mixed hyperlipidemia -     US Carotid Bilateral; Future -     MR Brain Wo Contrast; Future   PE did show some right facial weakness He is on livalo Added ASA '81mg'$  Neuro exam otherwise looked great BP not to goal if there are stroke risk factors Will get carotid US and MRI  Follow up in 1 month    Iran Planas, PA-C

## 2021-12-16 ENCOUNTER — Encounter: Payer: Self-pay | Admitting: Physician Assistant

## 2021-12-18 DIAGNOSIS — Z23 Encounter for immunization: Secondary | ICD-10-CM | POA: Diagnosis not present

## 2021-12-19 ENCOUNTER — Telehealth: Payer: Self-pay | Admitting: Neurology

## 2021-12-19 NOTE — Telephone Encounter (Signed)
Patient requesting medication for claustrophobia for MR. Please send to pharmacy on file.

## 2021-12-20 MED ORDER — TRIAZOLAM 0.25 MG PO TABS: ORAL_TABLET | ORAL | 0 refills | Status: DC

## 2021-12-20 NOTE — Telephone Encounter (Signed)
LVM letting patient know.

## 2021-12-24 ENCOUNTER — Other Ambulatory Visit: Payer: BC Managed Care – PPO

## 2021-12-24 ENCOUNTER — Ambulatory Visit (INDEPENDENT_AMBULATORY_CARE_PROVIDER_SITE_OTHER): Payer: Medicare Other

## 2021-12-24 DIAGNOSIS — I10 Essential (primary) hypertension: Secondary | ICD-10-CM

## 2021-12-24 DIAGNOSIS — E782 Mixed hyperlipidemia: Secondary | ICD-10-CM

## 2021-12-24 DIAGNOSIS — R531 Weakness: Secondary | ICD-10-CM | POA: Diagnosis not present

## 2021-12-24 DIAGNOSIS — R7303 Prediabetes: Secondary | ICD-10-CM

## 2021-12-24 DIAGNOSIS — I63233 Cerebral infarction due to unspecified occlusion or stenosis of bilateral carotid arteries: Secondary | ICD-10-CM | POA: Diagnosis not present

## 2021-12-24 DIAGNOSIS — G319 Degenerative disease of nervous system, unspecified: Secondary | ICD-10-CM | POA: Diagnosis not present

## 2021-12-24 DIAGNOSIS — R29810 Facial weakness: Secondary | ICD-10-CM

## 2021-12-24 NOTE — Progress Notes (Signed)
No significant calcifications of either carotid arteries. [place].

## 2021-12-25 ENCOUNTER — Other Ambulatory Visit: Payer: Self-pay | Admitting: Physician Assistant

## 2021-12-25 DIAGNOSIS — G319 Degenerative disease of nervous system, unspecified: Secondary | ICD-10-CM | POA: Insufficient documentation

## 2021-12-25 DIAGNOSIS — I679 Cerebrovascular disease, unspecified: Secondary | ICD-10-CM

## 2021-12-25 MED ORDER — AMOXICILLIN-POT CLAVULANATE 875-125 MG PO TABS: 1.0000 | ORAL_TABLET | Freq: Two times a day (BID) | ORAL | 0 refills | Status: DC

## 2021-12-25 NOTE — Progress Notes (Signed)
No acute findings in brain.  You do have some mild generalized brain atrophy.  You do have some mild small vessel ischemic changes- small arteries hardening Your cholesterol medication, keeping your BP controlled, and lets Add ASA '81mg'$  daily to medications.   Are you having any memory issues or decision making problems?   You also have a sinus infection. I will send over antibiotic.

## 2022-01-21 ENCOUNTER — Ambulatory Visit (INDEPENDENT_AMBULATORY_CARE_PROVIDER_SITE_OTHER): Payer: Medicare Other | Admitting: Physician Assistant

## 2022-01-21 ENCOUNTER — Encounter: Payer: Self-pay | Admitting: Physician Assistant

## 2022-01-21 VITALS — BP 139/71 | HR 76 | Ht 73.0 in | Wt 206.0 lb

## 2022-01-21 DIAGNOSIS — K2900 Acute gastritis without bleeding: Secondary | ICD-10-CM

## 2022-01-21 MED ORDER — OMEPRAZOLE 40 MG PO CPDR: 40.0000 mg | DELAYED_RELEASE_CAPSULE | Freq: Every day | ORAL | 0 refills | Status: DC

## 2022-01-21 NOTE — Progress Notes (Signed)
Acute Office Visit  Subjective:     Patient ID: Albert Hicks, male    DOB: Aug 27, 1940, 81 y.o.   MRN: [id number]  Chief Complaint  Patient presents with   Gastroesophageal Reflux    HPI Patient is in today for worsening acid reflux for the last week. His girlfriend had similar symptoms about a day before his started. Her symptoms improved but his continued. His appetite has been decreased and felt nauseated. He would eat food and felt it come back up. Stools have been loose but no melena or hematochezia. Tums have helped. Today he ate and actually feels much better. No fever, chills, body aches. He did have some epigastric pain when he would lay on left side. No CP or palpitations.   .. Active Ambulatory Problems    Diagnosis Date Noted   Hyperlipidemia    Essential hypertension    Obstruction of kidney 02/23/2012   CKD (chronic kidney disease) stage 3, GFR 30-59 ml/min (HCC) 03/23/2012   Hypokalemia 05/09/2012   Hyponatremia 05/09/2012   Leukocytosis, unspecified 05/09/2012   History of gout 06/14/2012   BPH (benign prostatic hypertrophy) with urinary obstruction 07/03/2012   Osteoarthritis of both knees 12/01/2012   Osteoarthritis of both shoulders 07/15/2013   Colon polyp 04/17/2014   Nonspecific abnormal electrocardiogram (ECG) (EKG) 05/02/2014   Abnormal ECG 06/14/2014   Primary osteoarthritis of both knees 01/18/2015   Elevated blood sugar 06/08/2015   Prediabetes 06/22/2015   Primary osteoarthritis of left knee 07/10/2015   S/P left TKA 08/21/2015   Overweight (BMI 25.0-29.9) 08/22/2015   Acute idiopathic gout of right knee 07/16/2016   Muscular atrophy 07/16/2016   OA (osteoarthritis) of hip 07/16/2016   Normocytic anemia 07/17/2016   Decreased testosterone level 07/17/2016   Acute gout of ankle 02/08/2019   Constipation 05/23/2020   Bloating 05/23/2020   Statin intolerance 06/24/2021   Nocturia 06/24/2021   Facial weakness 12/13/2021   Brain atrophy ([place])  12/25/2021   Small vessel disease, cerebrovascular 12/25/2021   Resolved Ambulatory Problems    Diagnosis Date Noted   History of elevated PSA 01/20/2012   Osteoarthritis of both knees 01/20/2012   Enlarged prostate 02/23/2012   CKD (chronic kidney disease) 03/23/2012   Pyelonephritis 05/09/2012   Sepsis due to urinary tract infection ([place]) 05/09/2012   Diarrhea 05/11/2012   S/P knee replacement 08/21/2015   Infected sebaceous cyst 12/02/2016   Loss of appetite 12/08/2016   Past Medical History:  Diagnosis Date   Acid reflux OCCASIONALLY TAKE TUMS   BPH (benign prostatic hypertrophy)    Complication of anesthesia    Elevated PSA    Gross hematuria    Hydronephrosis, left    Hypertension    OA (osteoarthritis) knees   PONV (postoperative nausea and vomiting)    Tinnitus    Wears glasses      ROS  See HPI.     Objective:    BP 139/71   Pulse 76   Ht '6\' 1"'$  (1.854 m)   Wt 206 lb (93.4 kg)   SpO2 99%   BMI 27.18 kg/m  BP Readings from Last 3 Encounters:  01/21/22 139/71  12/13/21 (!) 142/68  06/24/21 (!) 141/73   Wt Readings from Last 3 Encounters:  01/21/22 206 lb (93.4 kg)  12/13/21 209 lb (94.8 kg)  06/24/21 210 lb (95.3 kg)      Physical Exam Constitutional:      Appearance: Normal appearance.  Cardiovascular:     Rate and Rhythm:  Normal rate and regular rhythm.  Pulmonary:     Effort: Pulmonary effort is normal.  Abdominal:     General: Bowel sounds are normal. There is no distension.     Palpations: Abdomen is soft. There is no mass.     Tenderness: There is no abdominal tenderness. There is no right CVA tenderness, left CVA tenderness, guarding or rebound.     Hernia: No hernia is present.  Neurological:     Mental Status: He is alert.  Psychiatric:        Mood and Affect: Mood normal.          Assessment & Plan:  [name] KitchenMarland KitchenAhren was seen today for gastroesophageal reflux.  Diagnoses and all orders for this visit:  Acute gastritis without  hemorrhage, unspecified gastritis type -     CBC w/Diff/Platelet -     Lipase -     COMPLETE METABOLIC PANEL WITH GFR -     H. pylori breath test -     omeprazole (PRILOSEC) 40 MG capsule; Take 1 capsule (40 mg total) by mouth daily.   No red flag symptoms or worrisome findings on today's exam He is better today than he has been Discussed gastritis and possible causes such as NSAIDs, foods, alcohol, stress, viruses.  Will get labs and h.pylori breath test Start omeprazole for 2-4 weeks daily Avoid acidic foods, alcohol, NSAIDs.  Follow up as needed and if symptoms worsen or persist.   Iran Planas, PA-C

## 2022-01-21 NOTE — Patient Instructions (Addendum)
Take omeprazole in the morning for 2 weeks.  Avoid acidic foods and anti-inflammatories and alcohol Follow up as needed   Gastritis, Adult Gastritis is irritation and swelling (inflammation) of the stomach. There are two kinds of gastritis: Acute gastritis. This kind develops quickly. Chronic gastritis. This kind is much more common. It develops slowly and lasts for a long time. It is important to get help for this condition. If you do not get help, your stomach can bleed, and you can get sores (ulcers) in your stomach. What are the causes? This condition may be caused by: Germs that get to your stomach and cause an infection. Drinking too much alcohol. Medicines you are taking. Having too much acid in the stomach. Having a disease of the stomach. Other causes may include: An allergic reaction. Some cancer treatments (radiation). Smoking cigarettes or using products that contain nicotine or tobacco. In some cases, the cause of this condition is not known. What increases the risk? Having a disease of the intestines. Having Crohn's disease. Using aspirin or ibuprofen and other NSAIDs to treat other conditions. Stress. What are the signs or symptoms? Pain in your stomach. A burning feeling in your stomach. Feeling like you may vomit (nauseous). Vomiting or vomiting blood. Feeling too full after you eat. Weight loss. Bad breath. Blood in your poop (stool). In some cases, there are no symptoms. How is this treated? This condition is treated with medicines. The medicines that are used depend on what caused the condition. You may be given: Antibiotic medicine, if your condition was caused by an infection from germs. H2 blockers and similar medicines, if your condition was caused by too much acid in the stomach. Treatment may also include stopping the use of certain medicines, such as aspirin or ibuprofen. Follow these instructions at home: Medicines Take over-the-counter and  prescription medicines only as told by your doctor. If you were prescribed an antibiotic medicine, take it as told by your doctor. Do not stop taking it even if you start to feel better. Alcohol use Do not drink alcohol if: Your doctor tells you not to drink. You are pregnant, may be pregnant, or are planning to become pregnant. If you drink alcohol: Limit your use to: 0-1 drink a day for women. 0-2 drinks a day for men. Know how much alcohol is in your drink. In the [place] one drink equals one 12 oz bottle of beer (355 mL), one 5 oz glass of wine (148 mL), or one 1 oz glass of hard liquor (44 mL). General instructions  Eat small meals often, instead of large meals. Avoid foods and drinks that make you feel worse. Drink enough fluid to keep your pee (urine) pale yellow. Talk with your doctor about ways to manage stress. You can exercise or do deep breathing, meditation, or yoga. Do not smoke or use any products that contain nicotine or tobacco. If you need help quitting, ask your doctor. Keep all follow-up visits. Contact a doctor if: Your symptoms get worse. Your stomach pain gets worse. Your symptoms go away and then come back. You have a fever. Get help right away if: You vomit blood or something that looks like coffee grounds. You have black or dark red poop. You throw up any time you try to drink fluids. These symptoms may be an emergency. Get help right away. Call your local emergency services (911 in the [place] Do not wait to see if the symptoms will go away. Do not drive yourself to  the hospital. Summary Gastritis is irritation and swelling (inflammation) of the stomach. You must get help for this condition. If you do not get help, your stomach can bleed, and you can get sores (ulcers) in your stomach. You can be treated with medicines for germs or medicines to block too much acid in your stomach. This information is not intended to replace advice given to you by your  health care provider. Make sure you discuss any questions you have with your health care provider. Document Revised: 07/14/2020 Document Reviewed: 07/14/2020 Elsevier Patient Education  [place].

## 2022-01-22 NOTE — Progress Notes (Signed)
Breath test still pending.  Hemoglobin and WBC normal.  GFR stable at 50.

## 2022-01-23 LAB — COMPLETE METABOLIC PANEL WITH GFR
AG Ratio: 1.3 (calc) (ref 1.0–2.5)
ALT: 21 U/L (ref 9–46)
AST: 19 U/L (ref 10–35)
Albumin: 4.3 g/dL (ref 3.6–5.1)
Alkaline phosphatase (APISO): 50 U/L (ref 35–144)
BUN/Creatinine Ratio: 11 (calc) (ref 6–22)
BUN: 15 mg/dL (ref 7–25)
CO2: 28 mmol/L (ref 20–32)
Calcium: 9.3 mg/dL (ref 8.6–10.3)
Chloride: 107 mmol/L (ref 98–110)
Creat: 1.41 mg/dL — ABNORMAL HIGH (ref 0.70–1.22)
Globulin: 3.3 g/dL (calc) (ref 1.9–3.7)
Glucose, Bld: 96 mg/dL (ref 65–99)
Potassium: 5.2 mmol/L (ref 3.5–5.3)
Sodium: 142 mmol/L (ref 135–146)
Total Bilirubin: 0.6 mg/dL (ref 0.2–1.2)
Total Protein: 7.6 g/dL (ref 6.1–8.1)
eGFR: 50 mL/min/{1.73_m2} — ABNORMAL LOW (ref >=60)

## 2022-01-23 LAB — CBC WITH DIFFERENTIAL/PLATELET
Absolute Monocytes: 562 cells/uL (ref 200–950)
Basophils Absolute: 19 cells/uL (ref 0–200)
Basophils Relative: 0.5 %
Eosinophils Absolute: 30 cells/uL (ref 15–500)
Eosinophils Relative: 0.8 %
HCT: 41 % (ref 38.5–50.0)
Hemoglobin: 14.3 g/dL (ref 13.2–17.1)
Lymphs Abs: 946 cells/uL (ref 850–3900)
MCH: 34.5 pg — ABNORMAL HIGH (ref 27.0–33.0)
MCHC: 34.9 g/dL (ref 32.0–36.0)
MCV: 98.8 fL (ref 80.0–100.0)
MPV: 10.3 fL (ref 7.5–12.5)
Monocytes Relative: 14.8 %
Neutro Abs: 2242 cells/uL (ref 1500–7800)
Neutrophils Relative %: 59 %
Platelets: 337 10*3/uL (ref 140–400)
RBC: 4.15 10*6/uL — ABNORMAL LOW (ref 4.20–5.80)
RDW: 13 % (ref 11.0–15.0)
Total Lymphocyte: 24.9 %
WBC: 3.8 10*3/uL (ref 3.8–10.8)

## 2022-01-23 LAB — LIPASE: Lipase: 16 U/L (ref 7–60)

## 2022-01-23 LAB — H. PYLORI BREATH TEST: H. pylori Breath Test: NOT DETECTED

## 2022-01-23 NOTE — Progress Notes (Signed)
Negative for bacteria in stomach. I do think you had some gastritis. Should be improving with omeprazole. Let me know if not.

## 2022-02-28 ENCOUNTER — Other Ambulatory Visit: Payer: Self-pay | Admitting: Physician Assistant

## 2022-02-28 DIAGNOSIS — K2900 Acute gastritis without bleeding: Secondary | ICD-10-CM

## 2022-04-07 ENCOUNTER — Ambulatory Visit: Payer: Medicare Other | Admitting: Physician Assistant

## 2022-04-07 DIAGNOSIS — Z0000 Encounter for general adult medical examination without abnormal findings: Secondary | ICD-10-CM | POA: Diagnosis not present

## 2022-04-07 NOTE — Patient Instructions (Addendum)
[facility] Maintenance Summary and Written Plan of Care  Albert Hicks ,  Thank you for allowing me to perform your Medicare Annual Wellness Visit and for your ongoing commitment to your health.   Health Maintenance & Immunization History Health Maintenance  Topic Date Due   COVID-19 Vaccine (4 - 2023-24 season) 04/23/2022 (Originally 11/22/2021)   Zoster Vaccines- Shingrix (1 of 2) 07/07/2022 (Originally 04/22/1959)   COLONOSCOPY (Pts 45-23yr Insurance coverage will need to be confirmed)  04/08/2023 (Originally 02/09/2021)   Medicare Annual Wellness (AWV)  04/08/2023   DTaP/Tdap/Td (2 - Td or Tdap) 06/05/2025   Pneumonia Vaccine 81 Years old  Completed   INFLUENZA VACCINE  Completed   HPV VACCINES  Aged Out   Immunization History  Administered Date(s) Administered   Fluad Quad(high Dose 65+) 01/17/2019, 12/13/2021   Influenza-Unspecified 02/21/2021   PFIZER(Purple Top)SARS-COV-2 Vaccination 04/25/2019, 05/16/2019, 11/11/2019   Pneumococcal Conjugate-13 06/06/2015   Pneumococcal Polysaccharide-23 12/16/2017   Tdap 06/06/2015    These are the patient goals that we discussed:  Goals Addressed               This Visit's Progress     Patient Stated (pt-stated)        04/07/2022 AWV Goal: Improved Nutrition/Diet  Patient will verbalize understanding that diet plays an important role in overall health and that a poor diet is a risk factor for many chronic medical conditions.  Over the next year, patient will improve self management of their diet by incorporating more water. Patient will utilize available community resources to help with food acquisition if needed (ex: food pantries, [address], etc) Patient will work with nutrition specialist if a referral was made          This is a list of Health Maintenance Items that are overdue or due now: Colorectal cancer screening Shingrix vaccine    Orders/Referrals Placed Today: No orders of the  defined types were placed in this encounter.  (Contact our referral department at [phone] you have not spoken with someone about your referral appointment within the next 5 days)    Follow-up Plan Follow-up with BDonella Stade PA-C as planned Schedule shingrix vaccine at the pharmacy. Medicare wellness visit in one year. AVS printed and mailed to the patient.      Health Maintenance, Male Adopting a healthy lifestyle and getting preventive care are important in promoting health and wellness. Ask your health care provider about: The right schedule for you to have regular tests and exams. Things you can do on your own to prevent diseases and keep yourself healthy. What should I know about diet, weight, and exercise? Eat a healthy diet  Eat a diet that includes plenty of vegetables, fruits, low-fat dairy products, and lean protein. Do not eat a lot of foods that are high in solid fats, added sugars, or sodium. Maintain a healthy weight Body mass index (BMI) is a measurement that can be used to identify possible weight problems. It estimates body fat based on height and weight. Your health care provider can help determine your BMI and help you achieve or maintain a healthy weight. Get regular exercise Get regular exercise. This is one of the most important things you can do for your health. Most adults should: Exercise for at least 150 minutes each week. The exercise should increase your heart rate and make you sweat (moderate-intensity exercise). Do strengthening exercises at least twice a week. This is in addition to the  moderate-intensity exercise. Spend less time sitting. Even light physical activity can be beneficial. Watch cholesterol and blood lipids Have your blood tested for lipids and cholesterol at 81 years of age, then have this test every 5 years. You may need to have your cholesterol levels checked more often if: Your lipid or cholesterol levels are high. You  are older than 81 years of age. You are at high risk for heart disease. What should I know about cancer screening? Many types of cancers can be detected early and may often be prevented. Depending on your health history and family history, you may need to have cancer screening at various ages. This may include screening for: Colorectal cancer. Prostate cancer. Skin cancer. Lung cancer. What should I know about heart disease, diabetes, and high blood pressure? Blood pressure and heart disease High blood pressure causes heart disease and increases the risk of stroke. This is more likely to develop in people who have high blood pressure readings or are overweight. Talk with your health care provider about your target blood pressure readings. Have your blood pressure checked: Every 3-5 years if you are 58-71 years of age. Every year if you are 63 years old or older. If you are between the ages of 22 and 58 and are a current or former smoker, ask your health care provider if you should have a one-time screening for abdominal aortic aneurysm (AAA). Diabetes Have regular diabetes screenings. This checks your fasting blood sugar level. Have the screening done: Once every three years after age 10 if you are at a normal weight and have a low risk for diabetes. More often and at a younger age if you are overweight or have a high risk for diabetes. What should I know about preventing infection? Hepatitis B If you have a higher risk for hepatitis B, you should be screened for this virus. Talk with your health care provider to find out if you are at risk for hepatitis B infection. Hepatitis C Blood testing is recommended for: Everyone born from [birth year] through 1964. Anyone with known risk factors for hepatitis C. Sexually transmitted infections (STIs) You should be screened each year for STIs, including gonorrhea and chlamydia, if: You are sexually active and are younger than 81 years of age. You are  older than 81 years of age and your health care provider tells you that you are at risk for this type of infection. Your sexual activity has changed since you were last screened, and you are at increased risk for chlamydia or gonorrhea. Ask your health care provider if you are at risk. Ask your health care provider about whether you are at high risk for HIV. Your health care provider may recommend a prescription medicine to help prevent HIV infection. If you choose to take medicine to prevent HIV, you should first get tested for HIV. You should then be tested every 3 months for as long as you are taking the medicine. Follow these instructions at home: Alcohol use Do not drink alcohol if your health care provider tells you not to drink. If you drink alcohol: Limit how much you have to 0-2 drinks a day. Know how much alcohol is in your drink. In the [place] one drink equals one 12 oz bottle of beer (355 mL), one 5 oz glass of wine (148 mL), or one 1 oz glass of hard liquor (44 mL). Lifestyle Do not use any products that contain nicotine or tobacco. These products include cigarettes, chewing tobacco, and  vaping devices, such as e-cigarettes. If you need help quitting, ask your health care provider. Do not use street drugs. Do not share needles. Ask your health care provider for help if you need support or information about quitting drugs. General instructions Schedule regular health, dental, and eye exams. Stay current with your vaccines. Tell your health care provider if: You often feel depressed. You have ever been abused or do not feel safe at home. Summary Adopting a healthy lifestyle and getting preventive care are important in promoting health and wellness. Follow your health care provider's instructions about healthy diet, exercising, and getting tested or screened for diseases. Follow your health care provider's instructions on monitoring your cholesterol and blood pressure. This  information is not intended to replace advice given to you by your health care provider. Make sure you discuss any questions you have with your health care provider. Document Revised: 07/30/2020 Document Reviewed: 07/30/2020 Elsevier Patient Education  [facility].

## 2022-04-07 NOTE — Progress Notes (Signed)
MEDICARE ANNUAL WELLNESS VISIT  04/07/2022  Telephone Visit Disclaimer This Medicare AWV was conducted by telephone due to national recommendations for restrictions regarding the COVID-19 Pandemic (e.g. social distancing).  I verified, using two identifiers, that I am speaking with Albert Hicks or their authorized healthcare agent. I discussed the limitations, risks, security, and privacy concerns of performing an evaluation and management service by telephone and the potential availability of an in-person appointment in the future. The patient expressed understanding and agreed to proceed.  Location of Patient: Home Location of Provider (nurse):  Provider home  Subjective:    Albert Hicks is a 82 y.o. male patient of Alden Hipp, [name], PA-C who had a [employer] Visit today via telephone. Albert Hicks is Retired and lives with their partner. he has 5 children. he reports that he is socially active and does interact with friends/family regularly. 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  Patient Care Team: Lavada Mesi as PCP - General (Family Medicine)     04/07/2022   12:03 PM 04/01/2021    2:07 PM 03/08/2019    2:03 PM 03/02/2018    2:57 PM 09/17/2016    6:00 PM 09/12/2016    2:25 PM 08/24/2015    1:38 PM  Advanced Directives  Does Patient Have a Medical Advance Directive? No No No No No No No  Would patient like information on creating a medical advance directive? No - Patient declined No - Patient declined No - Patient declined No - Patient declined No - Patient declined No - Patient declined No - patient declined information    Hospital Utilization Over the Past 12 Months: # of hospitalizations or ER visits: 0 # of surgeries: 0  Review of Systems    Patient reports that his overall health is unchanged compared to last year.  History obtained from chart review and the patient  Patient Reported Readings (BP, Pulse, CBG,  Weight, etc) none  Pain Assessment Pain : No/denies pain     Current Medications & Allergies (verified) Allergies as of 04/07/2022       Reactions   Doxycycline    Itchy/rash   Oxycodone Nausea Only   Pt states all medications with oxy medication in it causes nausea   Lipitor [atorvastatin] Other (See Comments)   Muscle ache   Statins Other (See Comments)   Muscle soreness        Medication List        Accurate as of April 07, 2022 12:16 PM. If you have any questions, ask your nurse or doctor.          amLODipine 10 MG tablet Commonly known as: NORVASC Take 1 tablet (10 mg total) by mouth daily.   aspirin EC 81 MG tablet Take 81 mg by mouth daily. Swallow whole.   Livalo 4 MG Tabs Generic drug: Pitavastatin Calcium Take 1 tablet (4 mg total) by mouth daily.   omeprazole 40 MG capsule Commonly known as: PRILOSEC TAKE 1 CAPSULE(40 MG) BY MOUTH DAILY   SYSTANE OP Place 1-2 drops into both eyes daily as needed (For dry eyes.).        History (reviewed): Past Medical History:  Diagnosis Date   Acid reflux OCCASIONALLY TAKE TUMS   BPH (benign prostatic hypertrophy)    Complication of anesthesia    Elevated PSA    Gross hematuria    Hydronephrosis, left    Hyperlipidemia    Hypertension  OA (osteoarthritis) knees   PONV (postoperative nausea and vomiting)    Tinnitus    Wears glasses    Past Surgical History:  Procedure Laterality Date   APPENDECTOMY  AGE 37   CATARACT EXTRACTION W/ INTRAOCULAR LENS IMPLANT  2013   RIGHT EYE   CYSTOSCOPY  02/13/2012   Procedure: CYSTOSCOPY FLEXIBLE;  Surgeon: Hanley Ben, MD;  Location: [facility];  Service: Urology;  Laterality: N/A;   LEFT KNEE SURGERY  1993  (APPROX)   PROSTATECTOMY N/A 08/03/2012   Procedure: SIMPLE RETROPUBIC PROSTATECTOMY, removal of left double J stent. ;  Surgeon: Hanley Ben, MD;  Location: [facility];  Service: Urology;  Laterality: N/A;   TOTAL HIP  ARTHROPLASTY  07-09-2005  DR Wynelle Link   RIGHT HIP OA   TOTAL HIP ARTHROPLASTY Left 09/17/2016   Procedure: LEFT TOTAL HIP ARTHROPLASTY ANTERIOR APPROACH;  Surgeon: Gaynelle Arabian, MD;  Location: [facility];  Service: Orthopedics;  Laterality: Left;   TOTAL KNEE ARTHROPLASTY Left 08/21/2015   Procedure: LEFT TOTAL KNEE ARTHROPLASTY;  Surgeon: Paralee Cancel, MD;  Location: [facility];  Service: Orthopedics;  Laterality: Left;   Family History  Problem Relation Age of Onset   Hypertension Mother    Cancer Father        prostate   Hypertension Father    Diabetes Father    Social History   Socioeconomic History   Marital status: Significant Other    Spouse name: Helene Kelp   Number of children: 5   Years of education: 51   Highest education level: Master's degree (e.g., MA, MS, MEng, MEd, MSW, MBA)  Occupational History   Occupation: retired    Comment: Art therapist  Tobacco Use   Smoking status: Never   Smokeless tobacco: Never  Vaping Use   Vaping Use: Never used  Substance and Sexual Activity   Alcohol use: Yes    Alcohol/week: 2.0 standard drinks of alcohol    Types: 2 Glasses of wine per week    Comment: 2 glasses wine per night   Drug use: No   Sexual activity: Yes  Other Topics Concern   Not on file  Social History Narrative   Lives with significant other. Exercises 3-4 times a week. Very active lifestyle. 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    Social Determinants of Health   Financial Resource Strain: Low Risk  (04/07/2022)   Overall Financial Resource Strain (CARDIA)    Difficulty of Paying Living Expenses: Not hard at all  Food Insecurity: No Food Insecurity (04/07/2022)   Hunger Vital Sign    Worried About Running Out of Food in the Last Year: Never true    Ran Out of Food in the Last Year: Never true  Transportation Needs: No Transportation Needs (04/07/2022)   PRAPARE - Hydrologist (Medical): No    Lack of Transportation (Non-Medical): No   Physical Activity: Sufficiently Active (04/07/2022)   Exercise Vital Sign    Days of Exercise per Week: 3 days    Minutes of Exercise per Session: 60 min  Stress: No Stress Concern Present (04/07/2022)   [place]    Feeling of Stress : Not at all  Social Connections: Moderately Isolated (04/07/2022)   Social Connection and Isolation Panel [NHANES]    Frequency of Communication with Friends and Family: Three times a week    Frequency of Social Gatherings with Friends and Family: More than three times a week  Attends Religious Services: Never    Active Member of Clubs or Organizations: No    Attends Archivist Meetings: Never    Marital Status: Living with partner    Activities of Daily Living    04/07/2022   12:05 PM  In your present state of health, do you have any difficulty performing the following activities:  Hearing? 0  Vision? 1  Comment poor vision in the right eye  Difficulty concentrating or making decisions? 0  Walking or climbing stairs? 0  Dressing or bathing? 0  Doing errands, shopping? 0  Preparing Food and eating ? N  Using the Toilet? N  In the past six months, have you accidently leaked urine? N  Do you have problems with loss of bowel control? N  Managing your Medications? N  Managing your Finances? N  Housekeeping or managing your Housekeeping? N    Patient Education/ Literacy How often do you need to have someone help you when you read instructions, pamphlets, or other written materials from your doctor or pharmacy?: 1 - Never What is the last grade level you completed in school?: Graduate degree  Exercise Current Exercise Habits: Home exercise routine, Type of exercise: strength training/weights (cardio and bicycle), Time (Minutes): 60, Frequency (Times/Week): 3, Weekly Exercise (Minutes/Week): 180, Intensity: Moderate, Exercise limited by: None identified  Diet Patient  reports consuming 2 meals a day and 0 snack(s) a day Patient reports that his primary diet is: Regular Patient reports that she does have regular access to food.   Depression Screen    04/07/2022   12:05 PM 12/13/2021    2:26 PM 06/24/2021    1:41 PM 04/01/2021    2:08 PM 05/23/2020    5:59 AM 03/08/2019    2:04 PM 10/04/2018    2:24 PM  PHQ 2/9 Scores  PHQ - 2 Score 0 0 0 0 0 0 0  PHQ- 9 Score       0     Fall Risk    04/07/2022   12:05 PM 12/13/2021    2:26 PM 06/24/2021    1:40 PM 04/01/2021    2:07 PM 03/08/2019    2:04 PM  Fall Risk   Falls in the past year? 0 0 0 1 0  Number falls in past yr: 0 0 0 0 0  Injury with Fall? 0 0 0 1 0  Risk for fall due to :  No Fall Risks Other (Comment) History of fall(s)   Follow up  Falls evaluation completed Falls prevention discussed;Falls evaluation completed Falls evaluation completed;Falls prevention discussed;Education provided Falls prevention discussed     Objective:  Yehya Brendle seemed alert and oriented and he participated appropriately during our telephone visit.  Blood Pressure Weight BMI  BP Readings from Last 3 Encounters:  01/21/22 139/71  12/13/21 (!) 142/68  06/24/21 (!) 141/73   Wt Readings from Last 3 Encounters:  01/21/22 206 lb (93.4 kg)  12/13/21 209 lb (94.8 kg)  06/24/21 210 lb (95.3 kg)   BMI Readings from Last 1 Encounters:  01/21/22 27.18 kg/m    *Unable to obtain current vital signs, weight, and BMI due to telephone visit type  Hearing/Vision  Albert Hicks did not seem to have difficulty with hearing/understanding during the telephone conversation Reports that he has had a formal eye exam by an eye care professional within the past year Reports that he has not had a formal hearing evaluation within the past year *Unable to fully assess hearing and  vision during telephone visit type  Cognitive Function:    04/07/2022   12:07 PM 04/01/2021    2:15 PM 03/08/2019    2:08 PM 03/02/2018    3:06 PM  6CIT Screen   What Year? 0 points 0 points 0 points 0 points  What month? 0 points 0 points 0 points 0 points  What time? 0 points 0 points 0 points 0 points  Count back from 20 0 points 0 points 0 points 0 points  Months in reverse 0 points 0 points 0 points 0 points  Repeat phrase 0 points 0 points 0 points 0 points  Total Score 0 points 0 points 0 points 0 points   (Normal:0-7, Significant for Dysfunction: >8)  Normal Cognitive Function Screening: Yes   Immunization & Health Maintenance Record Immunization History  Administered Date(s) Administered   Fluad Quad(high Dose 65+) 01/17/2019, 12/13/2021   Influenza-Unspecified 02/21/2021   PFIZER(Purple Top)SARS-COV-2 Vaccination 04/25/2019, 05/16/2019, 11/11/2019   Pneumococcal Conjugate-13 06/06/2015   Pneumococcal Polysaccharide-23 12/16/2017   Tdap 06/06/2015    Health Maintenance  Topic Date Due   COVID-19 Vaccine (4 - 2023-24 season) 04/23/2022 (Originally 11/22/2021)   Zoster Vaccines- Shingrix (1 of 2) 07/07/2022 (Originally 04/22/1959)   COLONOSCOPY (Pts 45-36yr Insurance coverage will need to be confirmed)  04/08/2023 (Originally 02/09/2021)   Medicare Annual Wellness (AWV)  04/08/2023   DTaP/Tdap/Td (2 - Td or Tdap) 06/05/2025   Pneumonia Vaccine 82 Years old  Completed   INFLUENZA VACCINE  Completed   HPV VACCINES  Aged Out       Assessment  This is a routine wellness examination for AAlbertson's  Health Maintenance: Due or Overdue There are no preventive care reminders to display for this patient.   AKetrick Matneydoes not need a referral for Community Assistance: Care Management:   no Social Work:    no Prescription Assistance:  no Nutrition/Diabetes Education:  no   Plan:  Personalized Goals  Goals Addressed               This Visit's Progress     Patient Stated (pt-stated)        04/07/2022 AWV Goal: Improved Nutrition/Diet  Patient will verbalize understanding that diet plays an important role in overall  health and that a poor diet is a risk factor for many chronic medical conditions.  Over the next year, patient will improve self management of their diet by incorporating more water. Patient will utilize available community resources to help with food acquisition if needed (ex: food pantries, [address], etc) Patient will work with nutrition specialist if a referral was made        Personalized Health Maintenance & Screening Recommendations  Colorectal cancer screening Shingrix vaccine  Lung Cancer Screening Recommended: no (Low Dose CT Chest recommended if Age 81-80years, 30 pack-year currently smoking OR have quit w/in past 15 years) Hepatitis C Screening recommended: no HIV Screening recommended: no  Advanced Directives: Written information was not prepared per patient's request.  Referrals & Orders No orders of the defined types were placed in this encounter.   Follow-up Plan Follow-up with BDonella Stade PA-C as planned Schedule shingrix vaccine at the pharmacy. Medicare wellness visit in one year. AVS printed and mailed to the patient.   I have personally reviewed and noted the following in the patient's chart:   Medical and social history Use of alcohol, tobacco or illicit drugs  Current medications and supplements Functional ability and status Nutritional status Physical  activity Advanced directives List of other physicians Hospitalizations, surgeries, and ER visits in previous 12 months Vitals Screenings to include cognitive, depression, and falls Referrals and appointments  In addition, I have reviewed and discussed with Albert Hicks certain preventive protocols, quality metrics, and best practice recommendations. A written personalized care plan for preventive services as well as general preventive health recommendations is available and can be mailed to the patient at his request.      Tinnie Gens, RN BSN  04/07/2022

## 2022-06-05 DIAGNOSIS — H40013 Open angle with borderline findings, low risk, bilateral: Secondary | ICD-10-CM | POA: Diagnosis not present

## 2022-06-05 DIAGNOSIS — H524 Presbyopia: Secondary | ICD-10-CM | POA: Diagnosis not present

## 2022-06-05 DIAGNOSIS — Z961 Presence of intraocular lens: Secondary | ICD-10-CM | POA: Diagnosis not present

## 2022-06-05 DIAGNOSIS — H35371 Puckering of macula, right eye: Secondary | ICD-10-CM | POA: Diagnosis not present

## 2022-06-05 DIAGNOSIS — H35372 Puckering of macula, left eye: Secondary | ICD-10-CM | POA: Diagnosis not present

## 2022-06-18 DIAGNOSIS — H35373 Puckering of macula, bilateral: Secondary | ICD-10-CM | POA: Diagnosis not present

## 2022-06-18 DIAGNOSIS — H33312 Horseshoe tear of retina without detachment, left eye: Secondary | ICD-10-CM | POA: Diagnosis not present

## 2022-06-18 DIAGNOSIS — H18591 Other hereditary corneal dystrophies, right eye: Secondary | ICD-10-CM | POA: Diagnosis not present

## 2022-06-18 DIAGNOSIS — H43823 Vitreomacular adhesion, bilateral: Secondary | ICD-10-CM | POA: Diagnosis not present

## 2022-07-18 DIAGNOSIS — H43821 Vitreomacular adhesion, right eye: Secondary | ICD-10-CM | POA: Diagnosis not present

## 2022-07-18 DIAGNOSIS — H26491 Other secondary cataract, right eye: Secondary | ICD-10-CM | POA: Diagnosis not present

## 2022-07-25 DIAGNOSIS — H35371 Puckering of macula, right eye: Secondary | ICD-10-CM | POA: Diagnosis not present

## 2022-07-25 DIAGNOSIS — H43821 Vitreomacular adhesion, right eye: Secondary | ICD-10-CM | POA: Diagnosis not present

## 2022-07-25 DIAGNOSIS — H43822 Vitreomacular adhesion, left eye: Secondary | ICD-10-CM | POA: Diagnosis not present

## 2022-07-25 DIAGNOSIS — Z9889 Other specified postprocedural states: Secondary | ICD-10-CM | POA: Diagnosis not present

## 2022-08-11 ENCOUNTER — Telehealth: Payer: Self-pay | Admitting: Physician Assistant

## 2022-08-11 NOTE — Telephone Encounter (Signed)
Pt called. He is following up on request for refill on Amlodipine. Pharmacy:[facility] [address]

## 2022-08-11 NOTE — Telephone Encounter (Signed)
Pt called. He states [facility] left him a message stating [facility] denied refill on his Amlodipine and he only has 3 pills left.

## 2022-08-12 ENCOUNTER — Telehealth: Payer: Self-pay | Admitting: Physician Assistant

## 2022-08-12 DIAGNOSIS — I10 Essential (primary) hypertension: Secondary | ICD-10-CM

## 2022-08-12 DIAGNOSIS — E782 Mixed hyperlipidemia: Secondary | ICD-10-CM

## 2022-08-12 MED ORDER — AMLODIPINE BESYLATE 10 MG PO TABS: 10.0000 mg | ORAL_TABLET | Freq: Every day | ORAL | 1 refills | Status: DC

## 2022-08-12 NOTE — Telephone Encounter (Signed)
Pt called. He is following up on request for refill on Amlodipine. Pharmacy:[facility] [address] 

## 2022-08-13 MED ORDER — PITAVASTATIN CALCIUM 4 MG PO TABS: 4.0000 mg | ORAL_TABLET | Freq: Every day | ORAL | 0 refills | Status: DC

## 2022-08-13 NOTE — Addendum Note (Signed)
Addended by: Jomarie Longs on: 08/13/2022 04:15 PM   Modules accepted: Orders

## 2022-08-21 DIAGNOSIS — Z9889 Other specified postprocedural states: Secondary | ICD-10-CM | POA: Diagnosis not present

## 2022-08-21 DIAGNOSIS — H35371 Puckering of macula, right eye: Secondary | ICD-10-CM | POA: Diagnosis not present

## 2022-08-21 DIAGNOSIS — H43821 Vitreomacular adhesion, right eye: Secondary | ICD-10-CM | POA: Diagnosis not present

## 2022-08-28 DIAGNOSIS — H43821 Vitreomacular adhesion, right eye: Secondary | ICD-10-CM | POA: Diagnosis not present

## 2022-08-28 DIAGNOSIS — H35371 Puckering of macula, right eye: Secondary | ICD-10-CM | POA: Diagnosis not present

## 2022-08-28 DIAGNOSIS — Z9889 Other specified postprocedural states: Secondary | ICD-10-CM | POA: Diagnosis not present

## 2022-09-09 DIAGNOSIS — H40013 Open angle with borderline findings, low risk, bilateral: Secondary | ICD-10-CM | POA: Diagnosis not present

## 2022-09-09 DIAGNOSIS — H35372 Puckering of macula, left eye: Secondary | ICD-10-CM | POA: Diagnosis not present

## 2022-09-09 DIAGNOSIS — H524 Presbyopia: Secondary | ICD-10-CM | POA: Diagnosis not present

## 2022-09-09 DIAGNOSIS — H35371 Puckering of macula, right eye: Secondary | ICD-10-CM | POA: Diagnosis not present

## 2022-09-09 DIAGNOSIS — Z961 Presence of intraocular lens: Secondary | ICD-10-CM | POA: Diagnosis not present

## 2022-11-20 ENCOUNTER — Other Ambulatory Visit: Payer: Self-pay | Admitting: Physician Assistant

## 2022-11-20 DIAGNOSIS — E782 Mixed hyperlipidemia: Secondary | ICD-10-CM

## 2022-12-04 DIAGNOSIS — H35372 Puckering of macula, left eye: Secondary | ICD-10-CM | POA: Diagnosis not present

## 2022-12-04 DIAGNOSIS — H18591 Other hereditary corneal dystrophies, right eye: Secondary | ICD-10-CM | POA: Diagnosis not present

## 2022-12-04 DIAGNOSIS — H43822 Vitreomacular adhesion, left eye: Secondary | ICD-10-CM | POA: Diagnosis not present

## 2022-12-04 DIAGNOSIS — H33312 Horseshoe tear of retina without detachment, left eye: Secondary | ICD-10-CM | POA: Diagnosis not present

## 2022-12-22 ENCOUNTER — Telehealth: Payer: Self-pay | Admitting: Physician Assistant

## 2022-12-22 NOTE — Telephone Encounter (Signed)
I signed my part and left in Nick's box to finish filling out information. Call patient when ready.

## 2022-12-22 NOTE — Telephone Encounter (Signed)
Patient dropped off document DMV, to be filled out by provider. Patient requested to send it back via Call Patient to pick up within 5-days. Document is located in providers tray at front office.Please advise at [facility] [phone]

## 2022-12-25 DIAGNOSIS — H02012 Cicatricial entropion of right lower eyelid: Secondary | ICD-10-CM | POA: Diagnosis not present

## 2022-12-25 DIAGNOSIS — Z961 Presence of intraocular lens: Secondary | ICD-10-CM | POA: Diagnosis not present

## 2022-12-25 DIAGNOSIS — H35371 Puckering of macula, right eye: Secondary | ICD-10-CM | POA: Diagnosis not present

## 2022-12-25 DIAGNOSIS — H35372 Puckering of macula, left eye: Secondary | ICD-10-CM | POA: Diagnosis not present

## 2022-12-25 DIAGNOSIS — H40013 Open angle with borderline findings, low risk, bilateral: Secondary | ICD-10-CM | POA: Diagnosis not present

## 2023-01-05 DIAGNOSIS — H02032 Senile entropion of right lower eyelid: Secondary | ICD-10-CM | POA: Diagnosis not present

## 2023-01-05 DIAGNOSIS — H02834 Dermatochalasis of left upper eyelid: Secondary | ICD-10-CM | POA: Diagnosis not present

## 2023-01-05 DIAGNOSIS — H02135 Senile ectropion of left lower eyelid: Secondary | ICD-10-CM | POA: Diagnosis not present

## 2023-01-05 DIAGNOSIS — H02831 Dermatochalasis of right upper eyelid: Secondary | ICD-10-CM | POA: Diagnosis not present

## 2023-02-09 DIAGNOSIS — Z23 Encounter for immunization: Secondary | ICD-10-CM | POA: Diagnosis not present

## 2023-02-27 DIAGNOSIS — H02135 Senile ectropion of left lower eyelid: Secondary | ICD-10-CM | POA: Diagnosis not present

## 2023-02-27 DIAGNOSIS — H02032 Senile entropion of right lower eyelid: Secondary | ICD-10-CM | POA: Diagnosis not present

## 2023-02-27 DIAGNOSIS — H02831 Dermatochalasis of right upper eyelid: Secondary | ICD-10-CM | POA: Diagnosis not present

## 2023-02-27 DIAGNOSIS — H02834 Dermatochalasis of left upper eyelid: Secondary | ICD-10-CM | POA: Diagnosis not present

## 2023-02-28 ENCOUNTER — Other Ambulatory Visit: Payer: Self-pay | Admitting: Physician Assistant

## 2023-02-28 DIAGNOSIS — I10 Essential (primary) hypertension: Secondary | ICD-10-CM

## 2023-03-03 ENCOUNTER — Ambulatory Visit (INDEPENDENT_AMBULATORY_CARE_PROVIDER_SITE_OTHER): Payer: Medicare Other | Admitting: Physician Assistant

## 2023-03-03 ENCOUNTER — Encounter: Payer: Self-pay | Admitting: Physician Assistant

## 2023-03-03 VITALS — BP 172/76 | HR 98 | Ht 73.0 in | Wt 209.0 lb

## 2023-03-03 DIAGNOSIS — E782 Mixed hyperlipidemia: Secondary | ICD-10-CM | POA: Diagnosis not present

## 2023-03-03 DIAGNOSIS — Z23 Encounter for immunization: Secondary | ICD-10-CM | POA: Diagnosis not present

## 2023-03-03 DIAGNOSIS — Z87898 Personal history of other specified conditions: Secondary | ICD-10-CM

## 2023-03-03 DIAGNOSIS — I10 Essential (primary) hypertension: Secondary | ICD-10-CM

## 2023-03-03 MED ORDER — HYDROCHLOROTHIAZIDE 12.5 MG PO TABS: 12.5000 mg | ORAL_TABLET | Freq: Every day | ORAL | 0 refills | Status: DC

## 2023-03-03 NOTE — Progress Notes (Unsigned)
   Acute Office Visit  Subjective:     Patient ID: Albert Hicks, male    DOB: Jul 24, 1940, 82 y.o.   MRN: [id number]  Chief Complaint  Patient presents with   Medical Management of Chronic Issues    Htn fup after receiving eye surgery , pt b/p continued to be elevated, eye surgeon suggested a fup with pcp    HPI Patient is in today for elevated BP. He got eye surgery recently and his BP was high and surgeon suggested seeing PCP. He is on amlodipine 10 mg and has been taking his medications. Today BP elevated. No changes in medications. He was going to the gym up until his surgery because his surgeon said to not work out for 3 wks. He does drink a couple glasses of wine/day.  Review of Systems  Eyes:  Negative for blurred vision and double vision.  Respiratory:  Negative for shortness of breath.   Cardiovascular:  Negative for chest pain.  Genitourinary:  Negative for frequency.  Neurological:  Negative for dizziness and headaches.      Objective:    BP (!) 172/76 (BP Location: Left Arm)   Pulse 98   Ht 6\' 1"  (1.854 m)   Wt 94.8 kg   SpO2 97%   BMI 27.57 kg/m  BP Readings from Last 3 Encounters:  03/03/23 (!) 172/76  01/21/22 139/71  12/13/21 (!) 142/68    Physical Exam Cardiovascular:     Rate and Rhythm: Normal rate and regular rhythm.     Pulses: Normal pulses.     Heart sounds: Normal heart sounds.  Pulmonary:     Effort: Pulmonary effort is normal.     Breath sounds: Normal breath sounds.  Musculoskeletal:        General: No swelling.       Assessment & Plan:   Problem List Items Addressed This Visit       Cardiovascular and Mediastinum   Essential hypertension - Primary   Relevant Medications   hydrochlorothiazide (HYDRODIURIL) 12.5 MG tablet   Other Relevant Orders   CMP14+EGFR   CBC w/Diff/Platelet     Other   Hyperlipidemia   Relevant Medications   hydrochlorothiazide (HYDRODIURIL) 12.5 MG tablet   Other Relevant Orders   Lipid panel    Other Visit Diagnoses     Immunization due       Relevant Orders   Pfizer Comirnaty Covid -19 Vaccine 65yrs and older (Completed)   History of prediabetes       Relevant Orders   CMP14+EGFR       Meds ordered this encounter  Medications   hydrochlorothiazide (HYDRODIURIL) 12.5 MG tablet    Sig: Take 1 tablet (12.5 mg total) by mouth daily.    Dispense:  30 tablet    Refill:  0    Order Specific Question:   Supervising Provider    Answer:   Nani Gasser D [[id number]]   BP not well controlled on amlodipine 10 mg. Added hydrochlorothiazide 12.5 mg. Discussed side effect of increased urination with thiazides and advised patient to take it in the morning. Goal <140/90. Low sodium diet. Decrease alcohol intake.  Return in 2 wks to check BP and fasting labs.  Flu vacc in Oct. COVID vac administered today. Declined Zoster vac.  Return in about 2 weeks (around 03/17/2023) for nurse visit and lab recheck.  AnnaCollin M Mace, Student-PA

## 2023-03-03 NOTE — Patient Instructions (Signed)
Start hydrochlorothiazide in the morning  Managing Your Hypertension Hypertension, also called high blood pressure, is when the force of the blood pressing against the walls of the arteries is too strong. Arteries are blood vessels that carry blood from your heart throughout your body. Hypertension forces the heart to work harder to pump blood and may cause the arteries to become narrow or stiff. Understanding blood pressure readings A blood pressure reading includes a higher number over a lower number: The first, or top, number is called the systolic pressure. It is a measure of the pressure in your arteries as your heart beats. The second, or bottom number, is called the diastolic pressure. It is a measure of the pressure in your arteries as the heart relaxes. For most people, a normal blood pressure is below 120/80. Your personal target blood pressure may vary depending on your medical conditions, your age, and other factors. Blood pressure is classified into four stages. Based on your blood pressure reading, your health care provider may use the following stages to determine what type of treatment you need, if any. Systolic pressure and diastolic pressure are measured in a unit called millimeters of mercury (mmHg). Normal Systolic pressure: below 120. Diastolic pressure: below 80. Elevated Systolic pressure: 120-129. Diastolic pressure: below 80. Hypertension stage 1 Systolic pressure: 130-139. Diastolic pressure: 80-89. Hypertension stage 2 Systolic pressure: 140 or above. Diastolic pressure: 90 or above. How can this condition affect me? Managing your hypertension is very important. Over time, hypertension can damage the arteries and decrease blood flow to parts of the body, including the brain, heart, and kidneys. Having untreated or uncontrolled hypertension can lead to: A heart attack. A stroke. A weakened blood vessel (aneurysm). Heart failure. Kidney damage. Eye  damage. Memory and concentration problems. Vascular dementia. What actions can I take to manage this condition? Hypertension can be managed by making lifestyle changes and possibly by taking medicines. Your health care provider will help you make a plan to bring your blood pressure within a normal range. You may be referred for counseling on a healthy diet and physical activity. Nutrition  Eat a diet that is high in fiber and potassium, and low in salt (sodium), added sugar, and fat. An example eating plan is called the DASH diet. DASH stands for Dietary Approaches to Stop Hypertension. To eat this way: Eat plenty of fresh fruits and vegetables. Try to fill one-half of your plate at each meal with fruits and vegetables. Eat whole grains, such as whole-wheat pasta, brown rice, or whole-grain bread. Fill about one-fourth of your plate with whole grains. Eat low-fat dairy products. Avoid fatty cuts of meat, processed or cured meats, and poultry with skin. Fill about one-fourth of your plate with lean proteins such as fish, chicken without skin, beans, eggs, and tofu. Avoid pre-made and processed foods. These tend to be higher in sodium, added sugar, and fat. Reduce your daily sodium intake. Many people with hypertension should eat less than 1,500 mg of sodium a day. Lifestyle  Work with your health care provider to maintain a healthy body weight or to lose weight. Ask what an ideal weight is for you. Get at least 30 minutes of exercise that causes your heart to beat faster (aerobic exercise) most days of the week. Activities may include walking, swimming, or biking. Include exercise to strengthen your muscles (resistance exercise), such as weight lifting, as part of your weekly exercise routine. Try to do these types of exercises for 30 minutes at  least 3 days a week. Do not use any products that contain nicotine or tobacco. These products include cigarettes, chewing tobacco, and vaping devices, such  as e-cigarettes. If you need help quitting, ask your health care provider. Control any long-term (chronic) conditions you have, such as high cholesterol or diabetes. Identify your sources of stress and find ways to manage stress. This may include meditation, deep breathing, or making time for fun activities. Alcohol use Do not drink alcohol if: Your health care provider tells you not to drink. You are pregnant, may be pregnant, or are planning to become pregnant. If you drink alcohol: Limit how much you have to: 0-1 drink a day for women. 0-2 drinks a day for men. Know how much alcohol is in your drink. In the [place] one drink equals one 12 oz bottle of beer (355 mL), one 5 oz glass of wine (148 mL), or one 1 oz glass of hard liquor (44 mL). Medicines Your health care provider may prescribe medicine if lifestyle changes are not enough to get your blood pressure under control and if: Your systolic blood pressure is 130 or higher. Your diastolic blood pressure is 80 or higher. Take medicines only as told by your health care provider. Follow the directions carefully. Blood pressure medicines must be taken as told by your health care provider. The medicine does not work as well when you skip doses. Skipping doses also puts you at risk for problems. Monitoring Before you monitor your blood pressure: Do not smoke, drink caffeinated beverages, or exercise within 30 minutes before taking a measurement. Use the bathroom and empty your bladder (urinate). Sit quietly for at least 5 minutes before taking measurements. Monitor your blood pressure at home as told by your health care provider. To do this: Sit with your back straight and supported. Place your feet flat on the floor. Do not cross your legs. Support your arm on a flat surface, such as a table. Make sure your upper arm is at heart level. Each time you measure, take two or three readings one minute apart and record the results. You may also  need to have your blood pressure checked regularly by your health care provider. General information Talk with your health care provider about your diet, exercise habits, and other lifestyle factors that may be contributing to hypertension. Review all the medicines you take with your health care provider because there may be side effects or interactions. Keep all follow-up visits. Your health care provider can help you create and adjust your plan for managing your high blood pressure. Where to find more information National Heart, Lung, and Blood Institute: PopSteam.is American Heart Association: www.heart.org Contact a health care provider if: You think you are having a reaction to medicines you have taken. You have repeated (recurrent) headaches. You feel dizzy. You have swelling in your ankles. You have trouble with your vision. Get help right away if: You develop a severe headache or confusion. You have unusual weakness or numbness, or you feel faint. You have severe pain in your chest or abdomen. You vomit repeatedly. You have trouble breathing. These symptoms may be an emergency. Get help right away. Call 911. Do not wait to see if the symptoms will go away. Do not drive yourself to the hospital. Summary Hypertension is when the force of blood pumping through your arteries is too strong. If this condition is not controlled, it may put you at risk for serious complications. Your personal target blood pressure may  vary depending on your medical conditions, your age, and other factors. For most people, a normal blood pressure is less than 120/80. Hypertension is managed by lifestyle changes, medicines, or both. Lifestyle changes to help manage hypertension include losing weight, eating a healthy, low-sodium diet, exercising more, stopping smoking, and limiting alcohol. This information is not intended to replace advice given to you by your health care provider. Make sure you  discuss any questions you have with your health care provider. Document Revised: 11/22/2020 Document Reviewed: 11/22/2020 Elsevier Patient Education  2024 [employer].

## 2023-03-04 ENCOUNTER — Encounter: Payer: Self-pay | Admitting: Physician Assistant

## 2023-03-12 ENCOUNTER — Other Ambulatory Visit: Payer: Self-pay | Admitting: Physician Assistant

## 2023-03-12 DIAGNOSIS — E782 Mixed hyperlipidemia: Secondary | ICD-10-CM

## 2023-03-19 ENCOUNTER — Encounter: Payer: Self-pay | Admitting: Family Medicine

## 2023-03-19 ENCOUNTER — Ambulatory Visit (INDEPENDENT_AMBULATORY_CARE_PROVIDER_SITE_OTHER): Payer: Medicare Other | Admitting: Family Medicine

## 2023-03-19 VITALS — BP 128/67 | HR 100 | Ht 73.0 in

## 2023-03-19 DIAGNOSIS — I10 Essential (primary) hypertension: Secondary | ICD-10-CM | POA: Diagnosis not present

## 2023-03-19 NOTE — Progress Notes (Signed)
   Established Patient Office Visit  Subjective   Patient ID: Adir Gunnett, male    DOB: 08/21/1940  Age: 82 y.o. MRN: [id number]  Chief Complaint  Patient presents with   Hypertension    BP check nurse visit.     HPI  Hypertension. BP check nurse visit. Patient denies chest pain, shortness of breath, dizziness, palpitations-Patient states has not taken hydrochlorothiazide x 1 week because BP improved and s/e of frequent urination.  Patient will return for blood work to be drawn  tomorrow as he is not fasting at visit today.   ROS    Objective:     BP (!) 144/69 (BP Location: Left Arm, Patient Position: Sitting, Cuff Size: Large)   Pulse 100   Ht 6\' 1"  (1.854 m)   SpO2 98%   BMI 27.57 kg/m    Physical Exam   No results found for any visits on 03/19/23.    The ASCVD Risk score (Arnett DK, et al., 2019) failed to calculate for the following reasons:   The 2019 ASCVD risk score is only valid for ages 96 to 23    Assessment & Plan:  BP check nurse visit. Initial reading = 144/69 and second reading = 128/67. Per Dr. Linford Arnold continue Amlodipine.  Hydrochlorothiazide added to intolerance list due to s/e of frequent urination. Patient to return for BP f/u with Tandy Gaw in 3 months.  Problem List Items Addressed This Visit   None   No follow-ups on file.    Elizabeth Palau, LPN

## 2023-03-19 NOTE — Progress Notes (Signed)
Agree with documentation as above.   [name], MD  

## 2023-03-19 NOTE — Patient Instructions (Signed)
Return in 3 months for HTN f/u with Tandy Gaw, PA

## 2023-03-20 DIAGNOSIS — E782 Mixed hyperlipidemia: Secondary | ICD-10-CM | POA: Diagnosis not present

## 2023-03-20 DIAGNOSIS — I10 Essential (primary) hypertension: Secondary | ICD-10-CM | POA: Diagnosis not present

## 2023-03-20 DIAGNOSIS — Z87898 Personal history of other specified conditions: Secondary | ICD-10-CM | POA: Diagnosis not present

## 2023-03-21 LAB — CBC WITH DIFFERENTIAL/PLATELET
Basophils Absolute: 0 10*3/uL (ref 0.0–0.2)
Basos: 1 %
EOS (ABSOLUTE): 0.1 10*3/uL (ref 0.0–0.4)
Eos: 1 %
Hematocrit: 43.6 % (ref 37.5–51.0)
Hemoglobin: 14.6 g/dL (ref 13.0–17.7)
Immature Grans (Abs): 0 10*3/uL (ref 0.0–0.1)
Immature Granulocytes: 0 %
Lymphocytes Absolute: 1.8 10*3/uL (ref 0.7–3.1)
Lymphs: 30 %
MCH: 34.4 pg — ABNORMAL HIGH (ref 26.6–33.0)
MCHC: 33.5 g/dL (ref 31.5–35.7)
MCV: 103 fL — ABNORMAL HIGH (ref 79–97)
Monocytes Absolute: 0.6 10*3/uL (ref 0.1–0.9)
Monocytes: 11 %
Neutrophils Absolute: 3.4 10*3/uL (ref 1.4–7.0)
Neutrophils: 57 %
Platelets: 412 10*3/uL (ref 150–450)
RBC: 4.25 x10E6/uL (ref 4.14–5.80)
RDW: 12.8 % (ref 11.6–15.4)
WBC: 5.9 10*3/uL (ref 3.4–10.8)

## 2023-03-21 LAB — CMP14+EGFR
ALT: 13 [IU]/L (ref 0–44)
AST: 15 [IU]/L (ref 0–40)
Albumin: 4.3 g/dL (ref 3.7–4.7)
Alkaline Phosphatase: 70 [IU]/L (ref 44–121)
BUN/Creatinine Ratio: 8 — ABNORMAL LOW (ref 10–24)
BUN: 11 mg/dL (ref 8–27)
Bilirubin Total: 0.5 mg/dL (ref 0.0–1.2)
CO2: 22 mmol/L (ref 20–29)
Calcium: 9.7 mg/dL (ref 8.6–10.2)
Chloride: 106 mmol/L (ref 96–106)
Creatinine, Ser: 1.3 mg/dL — ABNORMAL HIGH (ref 0.76–1.27)
Globulin, Total: 3.5 g/dL (ref 1.5–4.5)
Glucose: 101 mg/dL — ABNORMAL HIGH (ref 70–99)
Potassium: 4.7 mmol/L (ref 3.5–5.2)
Sodium: 142 mmol/L (ref 134–144)
Total Protein: 7.8 g/dL (ref 6.0–8.5)
eGFR: 55 mL/min/{1.73_m2} — ABNORMAL LOW (ref >59)

## 2023-03-21 LAB — LIPID PANEL
Chol/HDL Ratio: 2.7 {ratio} (ref 0.0–5.0)
Cholesterol, Total: 180 mg/dL (ref 100–199)
HDL: 66 mg/dL (ref >39)
LDL Chol Calc (NIH): 97 mg/dL (ref 0–99)
Triglycerides: 94 mg/dL (ref 0–149)
VLDL Cholesterol Cal: 17 mg/dL (ref 5–40)

## 2023-03-23 NOTE — Progress Notes (Signed)
Albert Hicks,   Cholesterol looks good.  Hemoglobin good but size if RBC big. Would like to add B12 and folate testing to make sure in normal range. Can we add to lab?   Fasting glucose a little elevated. Will add A1C, please.   Kidney function improved a little which is good. If BP not to goal at next check we could consider medication that can protect renal function and lower BP a little.

## 2023-03-25 LAB — HEMOGLOBIN A1C
Est. average glucose Bld gHb Est-mCnc: 120 mg/dL
Hgb A1c MFr Bld: 5.8 % — ABNORMAL HIGH (ref 4.8–5.6)

## 2023-03-25 LAB — SPECIMEN STATUS REPORT

## 2023-03-25 LAB — B12 AND FOLATE PANEL
Folate: 3.6 ng/mL (ref >3.0)
Vitamin B-12: 196 pg/mL — ABNORMAL LOW (ref 232–1245)

## 2023-03-26 ENCOUNTER — Encounter: Payer: Self-pay | Admitting: Physician Assistant

## 2023-03-26 DIAGNOSIS — E538 Deficiency of other specified B group vitamins: Secondary | ICD-10-CM | POA: Insufficient documentation

## 2023-03-30 ENCOUNTER — Ambulatory Visit (INDEPENDENT_AMBULATORY_CARE_PROVIDER_SITE_OTHER): Payer: Medicare Other

## 2023-03-30 DIAGNOSIS — E538 Deficiency of other specified B group vitamins: Secondary | ICD-10-CM

## 2023-03-30 MED ORDER — CYANOCOBALAMIN 1000 MCG/ML IJ SOLN
1000.0000 ug | Freq: Once | INTRAMUSCULAR | Status: AC
  Administered 2023-03-30: 1000 ug via INTRAMUSCULAR

## 2023-03-30 MED ORDER — CYANOCOBALAMIN 1000 MCG/ML IJ SOLN: 1000.0000 ug | Freq: Once | INTRAMUSCULAR | 0 refills | Status: DC

## 2023-03-30 NOTE — Progress Notes (Signed)
   Established Patient Office Visit  Subjective   Patient ID: Albert Hicks, male    DOB: 1941-03-14  Age: 83 y.o. MRN: [id number]  No chief complaint on file.   HPI  Albert Hicks is here for a vitamin B 12 injection.   ROS    Objective:     There were no vitals taken for this visit.   Physical Exam   No results found for any visits on 03/30/23.    The ASCVD Risk score (Arnett DK, et al., 2019) failed to calculate for the following reasons:   The 2019 ASCVD risk score is only valid for ages 59 to 25    Assessment & Plan:  B12 injection - Patient tolerated injection well without complications. One time injection.    Problem List Items Addressed This Visit       Unprioritized   B12 deficiency - Primary    No follow-ups on file.    Bonny Jon Mayor, CMA

## 2023-04-13 ENCOUNTER — Encounter: Payer: Medicare Other | Admitting: Family Medicine

## 2023-06-17 ENCOUNTER — Ambulatory Visit (INDEPENDENT_AMBULATORY_CARE_PROVIDER_SITE_OTHER): Payer: Medicare Other | Admitting: Physician Assistant

## 2023-06-17 ENCOUNTER — Encounter: Payer: Self-pay | Admitting: Physician Assistant

## 2023-06-17 ENCOUNTER — Ambulatory Visit

## 2023-06-17 VITALS — BP 144/84 | HR 84 | Ht 73.0 in | Wt 194.0 lb

## 2023-06-17 DIAGNOSIS — R2231 Localized swelling, mass and lump, right upper limb: Secondary | ICD-10-CM | POA: Diagnosis not present

## 2023-06-17 DIAGNOSIS — M79641 Pain in right hand: Secondary | ICD-10-CM | POA: Insufficient documentation

## 2023-06-17 DIAGNOSIS — R7303 Prediabetes: Secondary | ICD-10-CM | POA: Diagnosis not present

## 2023-06-17 DIAGNOSIS — I10 Essential (primary) hypertension: Secondary | ICD-10-CM | POA: Diagnosis not present

## 2023-06-17 DIAGNOSIS — M7989 Other specified soft tissue disorders: Secondary | ICD-10-CM | POA: Diagnosis not present

## 2023-06-17 DIAGNOSIS — E782 Mixed hyperlipidemia: Secondary | ICD-10-CM | POA: Diagnosis not present

## 2023-06-17 DIAGNOSIS — M19041 Primary osteoarthritis, right hand: Secondary | ICD-10-CM | POA: Diagnosis not present

## 2023-06-17 LAB — POCT GLYCOSYLATED HEMOGLOBIN (HGB A1C): Hemoglobin A1C: 5.1 % (ref 4.0–5.6)

## 2023-06-17 MED ORDER — PREDNISONE 50 MG PO TABS: ORAL_TABLET | ORAL | 0 refills | Status: DC

## 2023-06-17 NOTE — Patient Instructions (Addendum)
 Keep up the good work.  Get xray and lab work done today.  Prednisone for 5 days.   Make sure getting protein in diet!   Low-Purine Eating Plan A low-purine eating plan involves making food choices to limit your purine intake. Purine is a kind of uric acid. Too much uric acid in your blood can cause certain conditions, such as gout and kidney stones. Eating a low-purine diet may help control these conditions. What are tips for following this plan? Shopping Avoid buying products that contain high-fructose corn syrup. Check for this on food labels. It is commonly found in many processed foods and soft drinks. Be sure to check for it in baked goods such as cookies, canned fruits, and cereals and cereal bars. Avoid buying veal, chicken breast with skin, lamb, and organ meats such as liver. These types of meats tend to have the highest purine content. Choose dairy products. These may lower uric acid levels. Avoid certain types of fish. Not all fish and seafood have high purine content. Examples with high purine content include anchovies, trout, tuna, sardines, and salmon. Avoid buying beverages that contain alcohol, particularly beer and hard liquor. Alcohol can affect the way your body gets rid of uric acid. Meal planning  Learn which foods do or do not affect you. If you find out that a food tends to cause your gout symptoms to flare up, avoid eating that food. You can enjoy foods that do not cause problems. If you have any questions about a food item, talk with your dietitian or health care provider. Reduce the overall amount of meat in your diet. When you do eat meat, choose ones with lower purine content. Include plenty of fruits and vegetables. Although some vegetables may have a high purine content--such as asparagus, mushrooms, spinach, or cauliflower--it has been shown that these do not contribute to uric acid blood levels as much. Consume at least 1 dairy serving a day. This has been shown  to decrease uric acid levels. General information If you drink alcohol: Limit how much you have to: 0-1 drink a day for women who are not pregnant. 0-2 drinks a day for men. Know how much alcohol is in a drink. In the [place].S., one drink equals one 12 oz bottle of beer (355 mL), one 5 oz glass of wine (148 mL), or one 1 oz glass of hard liquor (44 mL). Drink plenty of water. Try to drink enough to keep your urine pale yellow. Fluids can help remove uric acid from your body. Work with your health care provider and dietitian to develop a plan to achieve or maintain a healthy weight. Losing weight may help reduce uric acid in your blood. What foods are recommended? The following are some types of foods that are good choices when limiting purine intake: Fresh or frozen fruits and vegetables. Whole grains, breads, cereals, and pasta. Rice. Beans, peas, legumes. Nuts and seeds. Dairy products. Fats and oils. The items listed above may not be a complete list. Talk with a dietitian about what dietary choices are best for you. What foods are not recommended? Limit your intake of foods high in purines, including: Beer and other alcohol. Meat-based gravy or sauce. Canned or fresh fish, such as: Anchovies, sardines, herring, salmon, and tuna. Mussels and scallops. Codfish, trout, and haddock. Bacon, veal, chicken breast with skin, and lamb. Organ meats, such as: Liver or kidney. Tripe. Sweetbreads (thymus gland or pancreas). Wild Education officer, environmental. Yeast or yeast extract supplements.  Drinks sweetened with high-fructose corn syrup, such as soda. Processed foods made with high-fructose corn syrup. The items listed above may not be a complete list of foods and beverages you should limit. Contact a dietitian for more information. Summary Eating a low-purine diet may help control conditions caused by too much uric acid in the body, such as gout or kidney stones. Choose low-purine foods, limit alcohol,  and limit high-fructose corn syrup. You will learn over time which foods do or do not affect you. If you find out that a food tends to cause your gout symptoms to flare up, avoid eating that food. This information is not intended to replace advice given to you by your health care provider. Make sure you discuss any questions you have with your health care provider. Document Revised: 02/21/2021 Document Reviewed: 02/21/2021 Elsevier Patient Education  2024 [employer].

## 2023-06-17 NOTE — Progress Notes (Unsigned)
   Established Patient Office Visit  Subjective   Patient ID: Albert Hicks, male    DOB: 1940/08/30  Age: 83 y.o. MRN: [id number]  No chief complaint on file.   HPI  1 week of severe pain right hand  ROS    Objective:     There were no vitals taken for this visit. {Vitals History (Optional):23777}  Physical Exam   No results found for any visits on 06/17/23.  {Labs (Optional):23779}  The ASCVD Risk score (Arnett DK, et al., 2019) failed to calculate for the following reasons:   The 2019 ASCVD risk score is only valid for ages 75 to 43    Assessment & Plan:   Problem List Items Addressed This Visit       Unprioritized   Prediabetes - Primary   Relevant Orders   POCT HgB A1C    No follow-ups on file.    Tandy Gaw, PA-C

## 2023-06-18 ENCOUNTER — Other Ambulatory Visit: Payer: Self-pay | Admitting: Physician Assistant

## 2023-06-18 ENCOUNTER — Encounter: Payer: Self-pay | Admitting: Physician Assistant

## 2023-06-18 DIAGNOSIS — E782 Mixed hyperlipidemia: Secondary | ICD-10-CM

## 2023-06-18 LAB — CMP14+EGFR
ALT: 8 IU/L (ref 0–44)
AST: 11 IU/L (ref 0–40)
Albumin: 4.1 g/dL (ref 3.7–4.7)
Alkaline Phosphatase: 58 IU/L (ref 44–121)
BUN/Creatinine Ratio: 9 — ABNORMAL LOW (ref 10–24)
BUN: 11 mg/dL (ref 8–27)
Bilirubin Total: 0.5 mg/dL (ref 0.0–1.2)
CO2: 21 mmol/L (ref 20–29)
Calcium: 9.7 mg/dL (ref 8.6–10.2)
Chloride: 102 mmol/L (ref 96–106)
Creatinine, Ser: 1.26 mg/dL (ref 0.76–1.27)
Globulin, Total: 3.4 g/dL (ref 1.5–4.5)
Glucose: 109 mg/dL — ABNORMAL HIGH (ref 70–99)
Potassium: 4.5 mmol/L (ref 3.5–5.2)
Sodium: 139 mmol/L (ref 134–144)
Total Protein: 7.5 g/dL (ref 6.0–8.5)
eGFR: 57 mL/min/{1.73_m2} — ABNORMAL LOW (ref >59)

## 2023-06-18 LAB — C-REACTIVE PROTEIN: CRP: 24 mg/L — ABNORMAL HIGH (ref 0–10)

## 2023-06-18 LAB — URIC ACID: Uric Acid: 8.5 mg/dL — ABNORMAL HIGH (ref 3.8–8.4)

## 2023-06-18 LAB — SEDIMENTATION RATE: Sed Rate: 87 mm/h — ABNORMAL HIGH (ref 0–30)

## 2023-06-18 NOTE — Progress Notes (Signed)
 The pain and swelling in your hands appears to be gout related. Uric acid is high. Prednisone should help treat. We can discuss preventative medication to keep flares away.   Kidney function stable. GREAT news.

## 2023-06-19 ENCOUNTER — Encounter: Payer: Self-pay | Admitting: Physician Assistant

## 2023-06-24 ENCOUNTER — Other Ambulatory Visit (HOSPITAL_COMMUNITY): Payer: Self-pay

## 2023-06-24 ENCOUNTER — Telehealth: Payer: Self-pay

## 2023-06-24 NOTE — Telephone Encounter (Signed)
 Pharmacy Patient Advocate Encounter  Received notification from [facility] that Prior Authorization for Livalo 2 has been DENIED.  Full denial letter will be uploaded to the media tab. See denial reason below.

## 2023-06-25 ENCOUNTER — Ambulatory Visit: Payer: Self-pay

## 2023-06-25 NOTE — Telephone Encounter (Signed)
  Chief Complaint: right ankle pain  Symptoms: pain   Disposition: [] ED /[] Urgent Care (no appt availability in office) / [] Appointment(In office/virtual)/ []  [facility]/ [] Home Care/ [x] Refused Recommended Disposition /[] [facility]/ []  Follow-up with PCP Additional Notes: PT calling with gout flare-up. Pt started with right ankle pain 4/2. Pt states  the pain is so severe he can't walk. Pt stated it not really swollen and can't tell if discolored. Pt was seen on 3/27 and was given Prednisone for gout in hand. Pt stated that helped tremendously and is asking if more can be prescribed. Pt refused appt due to not being  able to  walk and doesn't have access to wheelchair. Pt is asking to be informed if this medication can be called without an appt. RN gave care advice and pt verbalized understanding.         Copied from CRM [id number]. Topic: Clinical - Red Word Triage >> Jun 25, 2023  8:45 AM Louie Boston wrote: Red Word that prompted transfer to Nurse Triage: Unable to walk, right ankle, knee and hand Reason for Disposition  [1] Redness of the skin AND [2] no fever  Answer Assessment - Initial Assessment Questions 1. ONSET: "When did the pain start?"      4/2 2. LOCATION: "Where is the pain located?"      Right ankle  3. PAIN: "How bad is the pain?"    (Scale 1-10; or mild, moderate, severe)  - MILD (1-3): doesn't interfere with normal activities.   - MODERATE (4-7): interferes with normal activities (e.g., work or school) or awakens from sleep, limping.   - SEVERE (8-10): excruciating pain, unable to do any normal activities, unable to walk.      Severe- can't walk   5. CAUSE: "What do you think is causing the ankle pain?"     Gout flare-up 6. OTHER SYMPTOMS: "Do you have any other symptoms?" (e.g., calf pain, rash, fever, swelling)     Right knee and right hand  Protocols used: Ankle Pain-A-AH

## 2023-06-26 MED ORDER — ALLOPURINOL 100 MG PO TABS: 100.0000 mg | ORAL_TABLET | Freq: Every day | ORAL | 6 refills | Status: DC

## 2023-06-26 MED ORDER — COLCHICINE 0.6 MG PO TABS
ORAL_TABLET | ORAL | 0 refills | Status: DC
Start: 2023-06-26 — End: 2023-07-01

## 2023-06-26 NOTE — Telephone Encounter (Signed)
 Patient informed.

## 2023-06-29 ENCOUNTER — Telehealth: Payer: Self-pay

## 2023-06-29 ENCOUNTER — Other Ambulatory Visit: Payer: Self-pay

## 2023-06-29 ENCOUNTER — Encounter: Payer: Self-pay | Admitting: Physician Assistant

## 2023-06-29 DIAGNOSIS — E782 Mixed hyperlipidemia: Secondary | ICD-10-CM

## 2023-06-29 MED ORDER — ALLOPURINOL 100 MG PO TABS: 100.0000 mg | ORAL_TABLET | Freq: Every day | ORAL | 3 refills | Status: DC

## 2023-06-29 NOTE — Progress Notes (Signed)
 No erosive changes but overall arthritis in joints of hands.

## 2023-07-01 ENCOUNTER — Other Ambulatory Visit: Payer: Self-pay | Admitting: Physician Assistant

## 2023-07-01 NOTE — Telephone Encounter (Unsigned)
 Copied from CRM [id number]. Topic: Clinical - Prescription Issue >> Jul 01, 2023  4:33 PM Alvino Blood C wrote: Reason for CRM: Patient states he contacted his pharmacy and was advised his medication: colchicine 0.6 MG tablet was not sent to the pharmacy.

## 2023-07-02 ENCOUNTER — Other Ambulatory Visit: Payer: Self-pay | Admitting: Physician Assistant

## 2023-07-02 MED ORDER — COLCHICINE 0.6 MG PO TABS: ORAL_TABLET | ORAL | 0 refills | Status: DC

## 2023-07-02 NOTE — Telephone Encounter (Signed)
 [facility] called and spoke to [employer], Pensions consultant about the refill(s) colchicine requested. Advised it was phoned in on 07/02/23 and the patient is saying it's not there. She says there is nothing showing in the system that it was received. Advised I will send back to the provider.  Copied from CRM [id number]. Topic: Clinical - Prescription Issue >> Jul 01, 2023  4:33 PM Alvino Blood C wrote: Reason for CRM: Patient states he contacted his pharmacy and was advised his medication: colchicine 0.6 MG tablet was not sent to the pharmacy. >> Jul 02, 2023 12:06 PM Irine Seal wrote: Patient states they just [facility] with [facility] who states they have not gotten the refill for  colchicine 0.6 MG tablet  informed him it was sent in 07/02/2023  at 9:56 EDT, advised patient it could take 24-48 hours. He stated he would like someone to call the pharmacy on his behalf to get this sorted out    [facility] [id number] - [place], [place] - [address] AT [facility] OF [address]  [address] [place] [address]  Phone: [phone] Fax: [phone]

## 2023-07-03 ENCOUNTER — Other Ambulatory Visit: Payer: Self-pay | Admitting: Physician Assistant

## 2023-07-03 NOTE — Telephone Encounter (Signed)
 Copied from CRM [id number]. Topic: Clinical - Medication Refill >> Jul 03, 2023 12:07 PM Maree Krabbe H wrote: Most Recent Primary Care Visit:  Provider: Jomarie Longs  Department: [facility]  Visit Type: OFFICE VISIT  Date: 06/17/2023  Medication: colchicine 0.6 MG tablet  Has the patient contacted their pharmacy? Yes (Agent: If no, request that the patient contact the pharmacy for the refill. If patient does not wish to contact the pharmacy document the reason why and proceed with request.) (Agent: If yes, when and what did the pharmacy advise?)  Is this the correct pharmacy for this prescription? Yes If no, delete pharmacy and type the correct one.  This is the patient's preferred pharmacy:  [id number] - [place], [place] - [address] AT [facility] OF [address] [place] [address] Phone: [phone] Fax: [phone]   Has the prescription been filled recently? Yes  Is the patient out of the medication? Yes  Has the patient been seen for an appointment in the last year OR does the patient have an upcoming appointment? Yes  Can we respond through MyChart? Yes  Agent: Please be advised that Rx refills may take up to 3 business days. We ask that you follow-up with your pharmacy.

## 2023-07-06 ENCOUNTER — Other Ambulatory Visit: Payer: Self-pay

## 2023-07-06 MED ORDER — COLCHICINE 0.6 MG PO TABS: ORAL_TABLET | ORAL | 0 refills | Status: DC

## 2023-07-06 NOTE — Telephone Encounter (Signed)
 Resent

## 2023-07-06 NOTE — Telephone Encounter (Signed)
 Colchicine listed as "phone in" on 07/02/23. [facility] pharmacy and confirmed they did not receive the medication. Routing to [facility].

## 2023-07-20 ENCOUNTER — Telehealth: Payer: Self-pay

## 2023-07-20 ENCOUNTER — Other Ambulatory Visit (HOSPITAL_COMMUNITY): Payer: Self-pay

## 2023-07-20 NOTE — Telephone Encounter (Signed)
 Pharmacy Patient Advocate Encounter   Received notification from Patient Pharmacy that prior authorization for Pitavastatin  4 mg tabs is required/requested.   Insurance verification completed.   The patient is insured through [employer] .   Per test claim: Patient has a plan benefit exclusion for this medication. Please see denial letter in patient media dated 06/24/23.

## 2023-07-22 ENCOUNTER — Other Ambulatory Visit (HOSPITAL_COMMUNITY): Payer: Self-pay

## 2023-07-22 ENCOUNTER — Telehealth: Payer: Self-pay

## 2023-07-22 NOTE — Telephone Encounter (Signed)
 Pharmacy Patient Advocate Encounter   Received notification from Pt Calls Messages that prior authorization for Livalo  2 is required/requested.   Insurance verification completed.   The patient is insured through [employer] .   Per test claim: Patient has a plan benefit exclusion. This medication will not be approved for a prior authorization. Please see encounter 07/20/23.

## 2023-07-22 NOTE — Telephone Encounter (Signed)
 Pt. Albert Hicks calling in regards to the medication: Pitavastatin  Calcium  4 MG TABS [[id number]]  Questioning will he be prescribed another mediation, since insurance will no longer cover,  Please advise

## 2023-08-03 ENCOUNTER — Telehealth: Payer: Self-pay

## 2023-08-03 NOTE — Telephone Encounter (Signed)
 Spoke with patient . He does not want to use [facility] as it is too far away from his home.  He also states that his insurance covers most of his medications with out a co-pay.  He is wanting to know if the medication could be changed to a different mediation as his insurance co. Told him That they will no longer cover the Pitavastatin  at all.

## 2023-08-03 NOTE — Telephone Encounter (Signed)
 Attempted return call to patient.  Again sent a Mychart message in response asking for a return message or call with how to proceed.

## 2023-08-03 NOTE — Telephone Encounter (Signed)
 Copied from CRM [id number]. Topic: Clinical - Medication Question >> Aug 03, 2023 11:57 AM Albert Hicks wrote: Reason for CRM: Patient is requesting a call back from nursing by the end of the day regarding the ongoing issue with not having a statin medication approved by insurance. He would like to know what medications insurance may have approved and where they can be sent. Please call back today.

## 2023-08-04 ENCOUNTER — Telehealth: Payer: Self-pay

## 2023-08-04 NOTE — Telephone Encounter (Signed)
 Copied from CRM [id number]. Topic: Clinical - Medication Question >> Aug 04, 2023 12:11 PM Carrielelia G wrote: Attn: Kim  Pt. Albert Hicks is calling back to give you this information he said regarding alternative medication for: pitavastain,   $0 copay 90 day supply Statins list from medicare.   Rosuvastatin Atorvastatin   Lovastatin Pravastatin  Simvastatin    [id number] - [place], [place] - [address] AT [facility] [address]  [address] [place] [address]  Phone: [phone] Fax: [phone]  Hours: Not open 24 hours    Please advise

## 2023-08-04 NOTE — Telephone Encounter (Signed)
 Copied from CRM [id number]. Topic: Clinical - Medication Question >> Aug 03, 2023 11:57 AM Corin V wrote: Reason for CRM: Patient is requesting a call back from nursing by the end of the day regarding the ongoing issue with not having a statin medication approved by insurance. He would like to know what medications insurance may have approved and where they can be sent. Please call back today. >> Aug 04, 2023 12:11 PM Carrielelia G wrote: Attn: Kim  Pt. Terry is calling back to give you this information he said regarding alternative medication for: pitavastain,   $0 copay 90 day supply Statins list from medicare.   Rosuvastatin Atorvastatin   Lovastatin Pravastatin  Simvastatin    [id number] - [place], [place] - [address] AT [facility] [address]  [address] [place]  [address]  Phone: [phone] Fax: [phone]  Hours: Not open 24 hours    Please advise

## 2023-08-04 NOTE — Telephone Encounter (Signed)
 Patient states he sent a list of statins that are covered by his insurance . He would like Jade to review these medications and send one of these to try instead of using [facility] for the Pitavastin.

## 2023-08-05 MED ORDER — PRAVASTATIN SODIUM 20 MG PO TABS: 20.0000 mg | ORAL_TABLET | Freq: Every day | ORAL | 3 refills | Status: AC

## 2023-08-05 NOTE — Telephone Encounter (Signed)
 I sent pravastatin  but watch out for side effects as you have reported before with statins.

## 2023-08-05 NOTE — Telephone Encounter (Signed)
 Patient informed.

## 2023-08-05 NOTE — Addendum Note (Signed)
 Addended by: Araceli Knight on: 08/05/2023 12:16 PM   Modules accepted: Orders

## 2023-08-05 NOTE — Telephone Encounter (Signed)
 Patient informed. Will reach out to the office if he has any s/e to statin.

## 2023-08-05 NOTE — Telephone Encounter (Signed)
Sent pravastatin 

## 2023-09-04 NOTE — Telephone Encounter (Signed)
Pt informed

## 2023-09-07 ENCOUNTER — Other Ambulatory Visit: Payer: Self-pay | Admitting: Physician Assistant

## 2023-09-07 DIAGNOSIS — I10 Essential (primary) hypertension: Secondary | ICD-10-CM

## 2023-10-06 DIAGNOSIS — H35371 Puckering of macula, right eye: Secondary | ICD-10-CM | POA: Diagnosis not present

## 2023-10-06 DIAGNOSIS — H02012 Cicatricial entropion of right lower eyelid: Secondary | ICD-10-CM | POA: Diagnosis not present

## 2023-10-06 DIAGNOSIS — H35372 Puckering of macula, left eye: Secondary | ICD-10-CM | POA: Diagnosis not present

## 2023-10-06 DIAGNOSIS — H40013 Open angle with borderline findings, low risk, bilateral: Secondary | ICD-10-CM | POA: Diagnosis not present

## 2023-10-06 DIAGNOSIS — Z961 Presence of intraocular lens: Secondary | ICD-10-CM | POA: Diagnosis not present

## 2023-12-18 ENCOUNTER — Ambulatory Visit: Admitting: Physician Assistant

## 2023-12-23 ENCOUNTER — Ambulatory Visit: Admitting: Physician Assistant

## 2023-12-23 ENCOUNTER — Encounter: Payer: Self-pay | Admitting: Physician Assistant

## 2023-12-23 VITALS — BP 150/70 | HR 77 | Ht 73.0 in | Wt 200.0 lb

## 2023-12-23 DIAGNOSIS — M19041 Primary osteoarthritis, right hand: Secondary | ICD-10-CM

## 2023-12-23 DIAGNOSIS — Z23 Encounter for immunization: Secondary | ICD-10-CM | POA: Diagnosis not present

## 2023-12-23 DIAGNOSIS — N1831 Chronic kidney disease, stage 3a: Secondary | ICD-10-CM | POA: Diagnosis not present

## 2023-12-23 DIAGNOSIS — E782 Mixed hyperlipidemia: Secondary | ICD-10-CM | POA: Diagnosis not present

## 2023-12-23 DIAGNOSIS — R7303 Prediabetes: Secondary | ICD-10-CM

## 2023-12-23 DIAGNOSIS — I10 Essential (primary) hypertension: Secondary | ICD-10-CM

## 2023-12-23 DIAGNOSIS — Z8739 Personal history of other diseases of the musculoskeletal system and connective tissue: Secondary | ICD-10-CM

## 2023-12-23 DIAGNOSIS — M19042 Primary osteoarthritis, left hand: Secondary | ICD-10-CM

## 2023-12-23 LAB — POCT GLYCOSYLATED HEMOGLOBIN (HGB A1C): Hemoglobin A1C: 5.2 % (ref 4.0–5.6)

## 2023-12-23 NOTE — Progress Notes (Unsigned)
   Established Patient Office Visit  Subjective   Patient ID: Albert Hicks, male    DOB: 1941/01/09  Age: 83 y.o. MRN: [id number]  Chief Complaint  Patient presents with   Medical Management of Chronic Issues    HPI Pt is a 83 year old male with pmh of HTN, prediabetes, HLD and gout who presents today for routine follow up.  He is doing well. He checks his blood pressure occasionally at home and it is always around 140 systolic. He is compliant on Amlodipine . He reports no new vision changes, headaches, palpitations, dizziness. He lifts weights often. He states he is getting worn out by traveling so much for work, but he manages it well. Denies SOB.  His only complaint is arthritis in his right wrist and fingers, but he Voltaren  gel brings relief. He denies any recent gout flares. He no longer takes daily allopurinol .     ROS See HPI.    Objective:     BP (!) 161/72   Pulse 77   Ht 6' 1 (1.854 m)   Wt 200 lb (90.7 kg)   SpO2 99%   BMI 26.39 kg/m  BP Readings from Last 3 Encounters:  12/23/23 (!) 161/72  06/17/23 (!) 144/84  03/19/23 128/67   Wt Readings from Last 3 Encounters:  12/23/23 200 lb (90.7 kg)  06/17/23 194 lb (88 kg)  03/03/23 209 lb (94.8 kg)      Physical Exam   Results for orders placed or performed in visit on 12/23/23  POCT HgB A1C  Result Value Ref Range   Hemoglobin A1C 5.2 4.0 - 5.6 %   HbA1c POC (<> result, manual entry)     HbA1c, POC (prediabetic range)     HbA1c, POC (controlled diabetic range)        Assessment & Plan:  .Albert Hicks was seen today for medical management of chronic issues.  Diagnoses and all orders for this visit:  Essential hypertension -     CMP14+EGFR  Mixed hyperlipidemia -     CMP14+EGFR  Prediabetes -     POCT HgB A1C -     CMP14+EGFR  Stage 3a chronic kidney disease (HCC) -     CMP14+EGFR  History of gout -     CMP14+EGFR -     Uric acid      Return in about 6 months (around 06/22/2024).     Niambi Smoak, PA-C

## 2023-12-23 NOTE — Patient Instructions (Addendum)
 Goal BP under 140/90 Managing Your Hypertension Hypertension, also called high blood pressure, is when the force of the blood pressing against the walls of the arteries is too strong. Arteries are blood vessels that carry blood from your heart throughout your body. Hypertension forces the heart to work harder to pump blood and may cause the arteries to become narrow or stiff. Understanding blood pressure readings A blood pressure reading includes a higher number over a lower number: The first, or top, number is called the systolic pressure. It is a measure of the pressure in your arteries as your heart beats. The second, or bottom number, is called the diastolic pressure. It is a measure of the pressure in your arteries as the heart relaxes. For most people, a normal blood pressure is below 120/80. Your personal target blood pressure may vary depending on your medical conditions, your age, and other factors. Blood pressure is classified into four stages. Based on your blood pressure reading, your health care provider may use the following stages to determine what type of treatment you need, if any. Systolic pressure and diastolic pressure are measured in a unit called millimeters of mercury (mmHg). Normal Systolic pressure: below 120. Diastolic pressure: below 80. Elevated Systolic pressure: 120-129. Diastolic pressure: below 80. Hypertension stage 1 Systolic pressure: 130-139. Diastolic pressure: 80-89. Hypertension stage 2 Systolic pressure: 140 or above. Diastolic pressure: 90 or above. How can this condition affect me? Managing your hypertension is very important. Over time, hypertension can damage the arteries and decrease blood flow to parts of the body, including the brain, heart, and kidneys. Having untreated or uncontrolled hypertension can lead to: A heart attack. A stroke. A weakened blood vessel (aneurysm). Heart failure. Kidney damage. Eye damage. Memory and concentration  problems. Vascular dementia. What actions can I take to manage this condition? Hypertension can be managed by making lifestyle changes and possibly by taking medicines. Your health care provider will help you make a plan to bring your blood pressure within a normal range. You may be referred for counseling on a healthy diet and physical activity. Nutrition  Eat a diet that is high in fiber and potassium, and low in salt (sodium), added sugar, and fat. An example eating plan is called the DASH diet. DASH stands for Dietary Approaches to Stop Hypertension. To eat this way: Eat plenty of fresh fruits and vegetables. Try to fill one-half of your plate at each meal with fruits and vegetables. Eat whole grains, such as whole-wheat pasta, brown rice, or whole-grain bread. Fill about one-fourth of your plate with whole grains. Eat low-fat dairy products. Avoid fatty cuts of meat, processed or cured meats, and poultry with skin. Fill about one-fourth of your plate with lean proteins such as fish, chicken without skin, beans, eggs, and tofu. Avoid pre-made and processed foods. These tend to be higher in sodium, added sugar, and fat. Reduce your daily sodium intake. Many people with hypertension should eat less than 1,500 mg of sodium a day. Lifestyle  Work with your health care provider to maintain a healthy body weight or to lose weight. Ask what an ideal weight is for you. Get at least 30 minutes of exercise that causes your heart to beat faster (aerobic exercise) most days of the week. Activities may include walking, swimming, or biking. Include exercise to strengthen your muscles (resistance exercise), such as weight lifting, as part of your weekly exercise routine. Try to do these types of exercises for 30 minutes at least 3  days a week. Do not use any products that contain nicotine or tobacco. These products include cigarettes, chewing tobacco, and vaping devices, such as e-cigarettes. If you need help  quitting, ask your health care provider. Control any long-term (chronic) conditions you have, such as high cholesterol or diabetes. Identify your sources of stress and find ways to manage stress. This may include meditation, deep breathing, or making time for fun activities. Alcohol use Do not drink alcohol if: Your health care provider tells you not to drink. You are pregnant, may be pregnant, or are planning to become pregnant. If you drink alcohol: Limit how much you have to: 0-1 drink a day for women. 0-2 drinks a day for men. Know how much alcohol is in your drink. In the [place] one drink equals one 12 oz bottle of beer (355 mL), one 5 oz glass of wine (148 mL), or one 1 oz glass of hard liquor (44 mL). Medicines Your health care provider may prescribe medicine if lifestyle changes are not enough to get your blood pressure under control and if: Your systolic blood pressure is 130 or higher. Your diastolic blood pressure is 80 or higher. Take medicines only as told by your health care provider. Follow the directions carefully. Blood pressure medicines must be taken as told by your health care provider. The medicine does not work as well when you skip doses. Skipping doses also puts you at risk for problems. Monitoring Before you monitor your blood pressure: Do not smoke, drink caffeinated beverages, or exercise within 30 minutes before taking a measurement. Use the bathroom and empty your bladder (urinate). Sit quietly for at least 5 minutes before taking measurements. Monitor your blood pressure at home as told by your health care provider. To do this: Sit with your back straight and supported. Place your feet flat on the floor. Do not cross your legs. Support your arm on a flat surface, such as a table. Make sure your upper arm is at heart level. Each time you measure, take two or three readings one minute apart and record the results. You may also need to have your blood pressure  checked regularly by your health care provider. General information Talk with your health care provider about your diet, exercise habits, and other lifestyle factors that may be contributing to hypertension. Review all the medicines you take with your health care provider because there may be side effects or interactions. Keep all follow-up visits. Your health care provider can help you create and adjust your plan for managing your high blood pressure. Where to find more information National Heart, Lung, and Blood Institute: PopSte[employer].is American Heart Association: www.heart.org Contact a health care provider if: You think you are having a reaction to medicines you have taken. You have repeated (recurrent) headaches. You feel dizzy. You have swelling in your ankles. You have trouble with your vision. Get help right away if: You develop a severe headache or confusion. You have unusual weakness or numbness, or you feel faint. You have severe pain in your chest or abdomen. You vomit repeatedly. You have trouble breathing. These symptoms may be an emergency. Get help right away. Call 911. Do not wait to see if the symptoms will go away. Do not drive yourself to the hospital. Summary Hypertension is when the force of blood pumping through your arteries is too strong. If this condition is not controlled, it may put you at risk for serious complications. Your personal target blood pressure may vary depending  on your medical conditions, your age, and other factors. For most people, a normal blood pressure is less than 120/80. Hypertension is managed by lifestyle changes, medicines, or both. Lifestyle changes to help manage hypertension include losing weight, eating a healthy, low-sodium diet, exercising more, stopping smoking, and limiting alcohol. This information is not intended to replace advice given to you by your health care provider. Make sure you discuss any questions you have with  your health care provider. Document Revised: 11/22/2020 Document Reviewed: 11/22/2020 Elsevier Patient Education  2024 [employer].

## 2023-12-24 LAB — URIC ACID: Uric Acid: 9 mg/dL — ABNORMAL HIGH (ref 3.8–8.4)

## 2023-12-24 LAB — CMP14+EGFR
ALT: 10 IU/L (ref 0–44)
AST: 12 IU/L (ref 0–40)
Albumin: 4.2 g/dL (ref 3.7–4.7)
Alkaline Phosphatase: 60 IU/L (ref 48–129)
BUN/Creatinine Ratio: 11 (ref 10–24)
BUN: 14 mg/dL (ref 8–27)
Bilirubin Total: 0.6 mg/dL (ref 0.0–1.2)
CO2: 20 mmol/L (ref 20–29)
Calcium: 9.2 mg/dL (ref 8.6–10.2)
Chloride: 102 mmol/L (ref 96–106)
Creatinine, Ser: 1.25 mg/dL (ref 0.76–1.27)
Globulin, Total: 3.3 g/dL (ref 1.5–4.5)
Glucose: 210 mg/dL — ABNORMAL HIGH (ref 70–99)
Potassium: 4.9 mmol/L (ref 3.5–5.2)
Sodium: 137 mmol/L (ref 134–144)
Total Protein: 7.5 g/dL (ref 6.0–8.5)
eGFR: 57 mL/min/1.73 — ABNORMAL LOW (ref >59)

## 2023-12-25 ENCOUNTER — Encounter: Payer: Self-pay | Admitting: Physician Assistant

## 2023-12-25 ENCOUNTER — Ambulatory Visit: Payer: Self-pay | Admitting: Physician Assistant

## 2023-12-25 DIAGNOSIS — Z23 Encounter for immunization: Secondary | ICD-10-CM

## 2023-12-25 DIAGNOSIS — M19041 Primary osteoarthritis, right hand: Secondary | ICD-10-CM | POA: Insufficient documentation

## 2023-12-25 MED ORDER — COLCHICINE 0.6 MG PO TABS: 0.6000 mg | ORAL_TABLET | Freq: Two times a day (BID) | ORAL | 1 refills | Status: DC

## 2023-12-25 MED ORDER — COLCHICINE 0.6 MG PO TABS: 0.6000 mg | ORAL_TABLET | Freq: Every day | ORAL | 1 refills | Status: AC

## 2023-12-25 MED ORDER — ALLOPURINOL 100 MG PO TABS: 100.0000 mg | ORAL_TABLET | Freq: Every day | ORAL | 3 refills | Status: AC

## 2023-12-25 NOTE — Addendum Note (Signed)
 Addended by: ANTONIETTE VERMELL CROME on: 12/25/2023 12:31 PM   Modules accepted: Orders

## 2023-12-25 NOTE — Progress Notes (Signed)
 Start allopurinol  daily( I sent 1 year) but for the first 2 months take colchine once a day with it to avoid a gout attack at first then after two months stay on only allopurinol .

## 2023-12-25 NOTE — Progress Notes (Signed)
 Johann,   Your uric acid is not to goal to keep you from having gout flares. I know you have not had any but would you want to start back on daily alloupurinol for gout prevention?  Kidney function stable.  Liver function looks good.

## 2024-06-22 ENCOUNTER — Ambulatory Visit: Admitting: Physician Assistant
